# Patient Record
Sex: Male | Born: 1945
Health system: Southern US, Community
[De-identification: ages and names within clinical notes are randomized; demographics above are authoritative.]

## PROBLEM LIST (undated history)

## (undated) DIAGNOSIS — C8290 Follicular lymphoma, unspecified, unspecified site: Secondary | ICD-10-CM

## (undated) DIAGNOSIS — R2242 Localized swelling, mass and lump, left lower limb: Secondary | ICD-10-CM

## (undated) DIAGNOSIS — I729 Aneurysm of unspecified site: Secondary | ICD-10-CM

## (undated) DIAGNOSIS — N289 Disorder of kidney and ureter, unspecified: Secondary | ICD-10-CM

## (undated) DIAGNOSIS — Z87442 Personal history of urinary calculi: Secondary | ICD-10-CM

## (undated) DIAGNOSIS — R7301 Impaired fasting glucose: Secondary | ICD-10-CM

## (undated) DIAGNOSIS — C44320 Squamous cell carcinoma of skin of unspecified parts of face: Secondary | ICD-10-CM

## (undated) DIAGNOSIS — M1990 Unspecified osteoarthritis, unspecified site: Secondary | ICD-10-CM

## (undated) DIAGNOSIS — E785 Hyperlipidemia, unspecified: Secondary | ICD-10-CM

## (undated) DIAGNOSIS — A419 Sepsis, unspecified organism: Secondary | ICD-10-CM

## (undated) DIAGNOSIS — N200 Calculus of kidney: Secondary | ICD-10-CM

## (undated) DIAGNOSIS — N132 Hydronephrosis with renal and ureteral calculous obstruction: Secondary | ICD-10-CM

## (undated) DIAGNOSIS — I10 Essential (primary) hypertension: Secondary | ICD-10-CM

## (undated) HISTORY — DX: Calculus of kidney: N20.0

## (undated) HISTORY — PX: TONSILLECTOMY: SUR1361

## (undated) HISTORY — PX: LITHOTRIPSY: SUR834

## (undated) HISTORY — DX: Hyperlipidemia, unspecified: E78.5

## (undated) HISTORY — PX: VASECTOMY: SHX75

## (undated) HISTORY — DX: Squamous cell carcinoma of skin of unspecified parts of face: C44.320

## (undated) HISTORY — DX: Impaired fasting glucose: R73.01

---

## 1998-03-31 ENCOUNTER — Encounter: Admission: RE | Admit: 1998-03-31 | Discharge: 1998-03-31 | Payer: Self-pay | Admitting: *Deleted

## 2001-12-24 ENCOUNTER — Ambulatory Visit (HOSPITAL_BASED_OUTPATIENT_CLINIC_OR_DEPARTMENT_OTHER): Admission: RE | Admit: 2001-12-24 | Discharge: 2001-12-24 | Payer: Self-pay | Admitting: Plastic Surgery

## 2001-12-24 ENCOUNTER — Encounter (INDEPENDENT_AMBULATORY_CARE_PROVIDER_SITE_OTHER): Payer: Self-pay | Admitting: Specialist

## 2003-03-08 ENCOUNTER — Emergency Department (HOSPITAL_COMMUNITY): Admission: EM | Admit: 2003-03-08 | Discharge: 2003-03-08 | Payer: Self-pay | Admitting: Emergency Medicine

## 2003-03-08 ENCOUNTER — Encounter: Payer: Self-pay | Admitting: Emergency Medicine

## 2003-07-31 HISTORY — PX: CATARACT EXTRACTION: SUR2

## 2004-08-02 ENCOUNTER — Emergency Department (HOSPITAL_COMMUNITY): Admission: EM | Admit: 2004-08-02 | Discharge: 2004-08-02 | Payer: Self-pay | Admitting: Emergency Medicine

## 2004-08-02 ENCOUNTER — Ambulatory Visit: Payer: Self-pay | Admitting: Internal Medicine

## 2004-08-10 ENCOUNTER — Ambulatory Visit (HOSPITAL_COMMUNITY): Admission: RE | Admit: 2004-08-10 | Discharge: 2004-08-10 | Payer: Self-pay | Admitting: Urology

## 2004-09-21 ENCOUNTER — Ambulatory Visit (HOSPITAL_COMMUNITY): Admission: RE | Admit: 2004-09-21 | Discharge: 2004-09-21 | Payer: Self-pay | Admitting: Urology

## 2005-07-30 HISTORY — PX: COLONOSCOPY W/ POLYPECTOMY: SHX1380

## 2005-08-10 ENCOUNTER — Ambulatory Visit: Payer: Self-pay | Admitting: Internal Medicine

## 2005-08-14 ENCOUNTER — Ambulatory Visit: Payer: Self-pay | Admitting: Internal Medicine

## 2005-09-11 ENCOUNTER — Ambulatory Visit: Payer: Self-pay | Admitting: Internal Medicine

## 2005-09-26 ENCOUNTER — Ambulatory Visit: Payer: Self-pay | Admitting: Internal Medicine

## 2005-09-26 ENCOUNTER — Encounter (INDEPENDENT_AMBULATORY_CARE_PROVIDER_SITE_OTHER): Payer: Self-pay | Admitting: *Deleted

## 2006-12-06 ENCOUNTER — Ambulatory Visit: Payer: Self-pay | Admitting: Internal Medicine

## 2007-04-24 ENCOUNTER — Emergency Department (HOSPITAL_COMMUNITY): Admission: EM | Admit: 2007-04-24 | Discharge: 2007-04-24 | Payer: Self-pay | Admitting: Emergency Medicine

## 2007-09-01 ENCOUNTER — Telehealth (INDEPENDENT_AMBULATORY_CARE_PROVIDER_SITE_OTHER): Payer: Self-pay | Admitting: *Deleted

## 2007-09-02 ENCOUNTER — Ambulatory Visit: Payer: Self-pay | Admitting: Internal Medicine

## 2007-09-02 DIAGNOSIS — E782 Mixed hyperlipidemia: Secondary | ICD-10-CM | POA: Insufficient documentation

## 2007-09-02 DIAGNOSIS — I10 Essential (primary) hypertension: Secondary | ICD-10-CM | POA: Insufficient documentation

## 2007-09-02 DIAGNOSIS — Z87442 Personal history of urinary calculi: Secondary | ICD-10-CM | POA: Insufficient documentation

## 2007-09-02 LAB — CONVERTED CEMR LAB: Cholesterol, target level: 200 mg/dL

## 2007-09-04 ENCOUNTER — Telehealth (INDEPENDENT_AMBULATORY_CARE_PROVIDER_SITE_OTHER): Payer: Self-pay | Admitting: *Deleted

## 2007-09-08 ENCOUNTER — Telehealth (INDEPENDENT_AMBULATORY_CARE_PROVIDER_SITE_OTHER): Payer: Self-pay | Admitting: *Deleted

## 2007-09-10 ENCOUNTER — Encounter: Payer: Self-pay | Admitting: Internal Medicine

## 2007-12-02 ENCOUNTER — Ambulatory Visit: Payer: Self-pay | Admitting: Internal Medicine

## 2007-12-02 LAB — CONVERTED CEMR LAB
Albumin: 3.9 g/dL (ref 3.5–5.2)
Alkaline Phosphatase: 64 units/L (ref 39–117)
BUN: 16 mg/dL (ref 6–23)
Creatinine, Ser: 1.3 mg/dL (ref 0.4–1.5)
LDL Cholesterol: 86 mg/dL (ref 0–99)
Potassium: 4.2 meq/L (ref 3.5–5.1)
Total CHOL/HDL Ratio: 3.6
Total Protein: 6.5 g/dL (ref 6.0–8.3)
Triglycerides: 76 mg/dL (ref 0–149)
VLDL: 15 mg/dL (ref 0–40)

## 2007-12-09 ENCOUNTER — Ambulatory Visit: Payer: Self-pay | Admitting: Internal Medicine

## 2007-12-09 LAB — CONVERTED CEMR LAB: Cholesterol, target level: 200 mg/dL

## 2008-02-11 ENCOUNTER — Emergency Department (HOSPITAL_COMMUNITY): Admission: EM | Admit: 2008-02-11 | Discharge: 2008-02-11 | Payer: Self-pay | Admitting: Emergency Medicine

## 2009-02-21 ENCOUNTER — Telehealth (INDEPENDENT_AMBULATORY_CARE_PROVIDER_SITE_OTHER): Payer: Self-pay | Admitting: *Deleted

## 2009-03-03 ENCOUNTER — Ambulatory Visit: Payer: Self-pay | Admitting: Internal Medicine

## 2009-03-08 ENCOUNTER — Encounter (INDEPENDENT_AMBULATORY_CARE_PROVIDER_SITE_OTHER): Payer: Self-pay | Admitting: *Deleted

## 2009-03-08 ENCOUNTER — Ambulatory Visit: Payer: Self-pay | Admitting: Internal Medicine

## 2009-03-08 DIAGNOSIS — D126 Benign neoplasm of colon, unspecified: Secondary | ICD-10-CM | POA: Insufficient documentation

## 2009-03-08 DIAGNOSIS — R7301 Impaired fasting glucose: Secondary | ICD-10-CM | POA: Insufficient documentation

## 2009-03-08 DIAGNOSIS — H5340 Unspecified visual field defects: Secondary | ICD-10-CM | POA: Insufficient documentation

## 2009-03-08 LAB — CONVERTED CEMR LAB
Alkaline Phosphatase: 49 units/L (ref 39–117)
BUN: 25 mg/dL — ABNORMAL HIGH (ref 6–23)
Bilirubin, Direct: 0 mg/dL (ref 0.0–0.3)
CO2: 32 meq/L (ref 19–32)
Chloride: 104 meq/L (ref 96–112)
Cholesterol: 147 mg/dL (ref 0–200)
Creatinine, Ser: 1.4 mg/dL (ref 0.4–1.5)
Glucose, Bld: 95 mg/dL (ref 70–99)
LDL Cholesterol: 76 mg/dL (ref 0–99)
Potassium: 4.6 meq/L (ref 3.5–5.1)
Total Bilirubin: 0.9 mg/dL (ref 0.3–1.2)
Total CHOL/HDL Ratio: 3
VLDL: 13.4 mg/dL (ref 0.0–40.0)

## 2009-03-10 ENCOUNTER — Encounter (INDEPENDENT_AMBULATORY_CARE_PROVIDER_SITE_OTHER): Payer: Self-pay | Admitting: *Deleted

## 2009-09-09 ENCOUNTER — Ambulatory Visit: Payer: Self-pay | Admitting: Internal Medicine

## 2009-09-09 DIAGNOSIS — R1310 Dysphagia, unspecified: Secondary | ICD-10-CM | POA: Insufficient documentation

## 2009-09-09 DIAGNOSIS — R51 Headache: Secondary | ICD-10-CM | POA: Insufficient documentation

## 2009-09-09 DIAGNOSIS — R519 Headache, unspecified: Secondary | ICD-10-CM | POA: Insufficient documentation

## 2009-09-09 DIAGNOSIS — J309 Allergic rhinitis, unspecified: Secondary | ICD-10-CM | POA: Insufficient documentation

## 2010-03-09 ENCOUNTER — Telehealth (INDEPENDENT_AMBULATORY_CARE_PROVIDER_SITE_OTHER): Payer: Self-pay | Admitting: *Deleted

## 2010-04-05 ENCOUNTER — Ambulatory Visit: Payer: Self-pay | Admitting: Internal Medicine

## 2010-04-05 LAB — CONVERTED CEMR LAB
Albumin: 4.1 g/dL (ref 3.5–5.2)
Alkaline Phosphatase: 55 units/L (ref 39–117)
Cholesterol: 131 mg/dL (ref 0–200)
HDL: 47.9 mg/dL (ref >39.00)
LDL Cholesterol: 69 mg/dL (ref 0–99)
Total CHOL/HDL Ratio: 3
Total Protein: 6.2 g/dL (ref 6.0–8.3)
Triglycerides: 73 mg/dL (ref 0.0–149.0)

## 2010-04-06 ENCOUNTER — Ambulatory Visit: Payer: Self-pay | Admitting: Internal Medicine

## 2010-04-06 DIAGNOSIS — R079 Chest pain, unspecified: Secondary | ICD-10-CM | POA: Insufficient documentation

## 2010-04-28 ENCOUNTER — Ambulatory Visit: Payer: Self-pay | Admitting: Cardiology

## 2010-04-28 ENCOUNTER — Ambulatory Visit: Payer: Self-pay

## 2010-08-27 LAB — CONVERTED CEMR LAB
Hgb A1c MFr Bld: 5.9 % (ref 4.6–6.5)
LDL Goal: 100 mg/dL
PSA: 0.92 ng/mL (ref 0.10–4.00)

## 2010-08-29 NOTE — Assessment & Plan Note (Signed)
Summary: occasional chest pain/cbs   Vital Signs:  Patient profile:   65 year old male Weight:      238.2 pounds BMI:     32.42 Temp:     98.9 degrees F oral Pulse rate:   56 / minute Resp:     14 per minute BP sitting:   128 / 80  (left arm) Cuff size:   large  Vitals Entered By: Shonna Chock CMA (April 06, 2010 8:59 AM) CC: Chest pain off/on x 6 months    CC:  Chest pain off/on x 6 months .  History of Present Illness:      This is a 65 year old male who presents with intermittent chest pain for 6 months.  The patient reports exertional chest pain ONLY with organism ( unaffected by position)  but not with intercourse.He walks 3.7 miles over 61 min w/o chest pain, but he has DOE with hills. He  denies nausea, vomiting, diaphoresis, shortness of breath, palpitations, dizziness, light headedness, syncope, and indigestion.  The pain is described as  a sharp "pinch".  The pain is located in the left anterior chest& right anterior chest, and the pain does not radiate.  Episodes of chest pain last < 1 minute.  The pain is relieved or improved with rest.  Hyperlipidemia Follow-Up      The patient also presents for Hyperlipidemia follow-up.  The patient reports muscle aches after lifting weights, but denies GI upset, abdominal pain, flushing, itching, constipation, diarrhea, and fatigue.  Other symptoms include pedal edema after 12 hr work  shift.  The patient denies the following symptoms: palpitations and syncope.  Compliance with medications (by patient report) has been near 100%.  Dietary compliance has been fair.  The patient reports exercising 3-4X per week.  Adjunctive measures currently used by the patient include ASA.  Lipids are excellent .  Current Medications (verified): 1)  Multivitamin .... Qd 2)  Asa 81mg  .... Qd 3)  Lipitor 20 Mg  Tabs (Atorvastatin Calcium) .Marland Kitchen.. 1 M,w,f,sun **appointment Due** 4)  Metoprolol Tartrate 25 Mg  Tabs (Metoprolol Tartrate) .... Take One Tablet  Twice Daily 5)  Advil Prn 6)  Amlodipine Besylate 5 Mg Tabs (Amlodipine Besylate) .Marland Kitchen.. 1 Once Daily 7)  Fluticasone Propionate 50 Mcg/act Susp (Fluticasone Propionate) .Marland Kitchen.. 1 Spray Two Times A Day As Discussed 8)  Gabapentin 100 Mg Caps (Gabapentin) .Marland Kitchen.. 1 Q 8 Hrs As Needed Headaches  Allergies (verified): No Known Drug Allergies  Review of Systems GI:  Denies bloody stools and dark tarry stools.  Physical Exam  General:  well-nourished,in no acute distress; alert,appropriate and cooperative throughout examination Lungs:  Normal respiratory effort, chest expands symmetrically. Lungs are clear to auscultation, no crackles or wheezes. Heart:  regular rhythm, no murmur, no gallop, no rub, no JVD, no HJR, and bradycardia.  S4 with slurring Abdomen:  Bowel sounds positive,abdomen soft and non-tender without masses, organomegaly or hernias noted. Pulses:  R and L carotid,radial,dorsalis pedis and posterior tibial pulses are full and equal bilaterally Extremities:  No clubbing, cyanosis, edema.Negative Homan's sign Neurologic:  alert & oriented X3.   Skin:  Intact without suspicious lesions or rashes Psych:  memory intact for recent and remote, normally interactive, and good eye contact.     Impression & Recommendations:  Problem # 1:  CHEST PAIN (ICD-786.50)  Orders: EKG w/ Interpretation (93000) Misc. Referral (Misc. Ref)  Problem # 2:  HYPERLIPIDEMIA (ICD-272.2)  His updated medication list for this problem includes:  Lipitor 20 Mg Tabs (Atorvastatin calcium) .Marland Kitchen... 1 m,w,f,sun  Problem # 3:  HYPERTENSION, ESSENTIAL NOS (ICD-401.9) controlled His updated medication list for this problem includes:    Metoprolol Tartrate 25 Mg Tabs (Metoprolol tartrate) .Marland Kitchen... Take one tablet twice daily    Amlodipine Besylate 5 Mg Tabs (Amlodipine besylate) .Marland Kitchen... 1 once daily  Complete Medication List: 1)  Multivitamin  .... Qd 2)  Asa 81mg   .... Qd 3)  Lipitor 20 Mg Tabs (Atorvastatin  calcium) .Marland Kitchen.. 1 m,w,f,sun 4)  Metoprolol Tartrate 25 Mg Tabs (Metoprolol tartrate) .... Take one tablet twice daily 5)  Advil Prn  6)  Amlodipine Besylate 5 Mg Tabs (Amlodipine besylate) .Marland Kitchen.. 1 once daily 7)  Fluticasone Propionate 50 Mcg/act Susp (Fluticasone propionate) .Marland Kitchen.. 1 spray two times a day as discussed 8)  Gabapentin 100 Mg Caps (Gabapentin) .Marland Kitchen.. 1 q 8 hrs as needed headaches  Patient Instructions: 1)  Check your Blood Pressure regularly. If it is above: 135/85 ONAVERAGE  you should make an appointment. Prescriptions: AMLODIPINE BESYLATE 5 MG TABS (AMLODIPINE BESYLATE) 1 once daily  #90 x 3   Entered and Authorized by:   Marga Melnick MD   Signed by:   Marga Melnick MD on 04/06/2010   Method used:   Print then Give to Patient   RxID:   [id number] METOPROLOL TARTRATE 25 MG  TABS (METOPROLOL TARTRATE) Take one tablet twice daily  #180 x 3   Entered and Authorized by:   Marga Melnick MD   Signed by:   Marga Melnick MD on 04/06/2010   Method used:   Print then Give to Patient   RxID:   [id number] LIPITOR 20 MG  TABS (ATORVASTATIN CALCIUM) 1 M,W,F,Sun  #90 x 2   Entered and Authorized by:   Marga Melnick MD   Signed by:   Marga Melnick MD on 04/06/2010   Method used:   Print then Give to Patient   RxID:   [id number]

## 2010-08-29 NOTE — Assessment & Plan Note (Signed)
Summary: TROUBLE SWALLOWING/RH......   Vital Signs:  Patient profile:   65 year old male Weight:      236 pounds Temp:     98.8 degrees F oral Pulse rate:   60 / minute Resp:     15 per minute BP sitting:   134 / 92  (left arm) Cuff size:   large  Vitals Entered By: Shonna Chock (September 09, 2009 2:52 PM) CC: Trouble swallowing, Hypertension Management Comments REVIEWED MED LIST, PATIENT AGREED DOSE AND INSTRUCTION CORRECT    CC:  Trouble swallowing and Hypertension Management.  History of Present Illness: He could  not swallow the PNDrainage secretions last night; as if "throat closed in". He has chronic seasonal rhinitis with clear secretions. No symptoms of infection. He is on ACE-I; no edema of  tongue or lips. See BP ; it has been elevated ; he missed meds over holidays  Hypertension History:      He complains of headache, peripheral edema, and side effects from treatment, but denies chest pain, palpitations, dyspnea with exertion, orthopnea, PND, visual symptoms, neurologic problems, and syncope.  He notes the following problems with antihypertensive medication side effects: See HPI; R/O atypical angioedema.  Further comments include: Occa pedal edema.        Positive major cardiovascular risk factors include male age 63 years old or older, hyperlipidemia, and hypertension.  Negative major cardiovascular risk factors include no history of diabetes, negative family history for ischemic heart disease, and non-tobacco-user status.        Further assessment for target organ damage reveals no history of ASHD, stroke/TIA, or peripheral vascular disease.     Allergies (verified): No Known Drug Allergies  Review of Systems General:  Denies chills, fever, and sweats. Eyes:  Denies blurring, double vision, and vision loss-both eyes. ENT:  Complains of sinus pressure; denies nasal congestion; No facial pain , frontal headache or significant purulence. Resp:  Complains of excessive  snoring; denies cough, hypersomnolence, morning headaches, sputum productive, and wheezing. GI:  Complains of gas; denies indigestion; rare  dysphagia with food  X 20 yrs. Neuro:  Denies numbness and tingling; Headaches are sharp & throbbing in R ant temple; Advil +/- benefit. Allergy:  Denies hives or rash, itching eyes, and sneezing.  Physical Exam  General:  well-nourished,in no acute distress; alert,appropriate and cooperative throughout examination Ears:  External ear exam shows no significant lesions or deformities.  Otoscopic examination reveals clear canals, tympanic membranes are intact bilaterally without bulging, retraction, inflammation or discharge. Hearing is grossly normal bilaterally. Nose:  External nasal examination shows no deformity or inflammation. Nasal mucosa are pink and moist without lesions or exudates. Mouth:  Oral mucosa and oropharynx without lesions or exudates.  Uvula edematous Neck:  No deformities, masses, or tenderness noted. No UAO to hyperventilation Lungs:  Normal respiratory effort, chest expands symmetrically. Lungs are clear to auscultation, no crackles or wheezes. Cervical Nodes:  No lymphadenopathy noted Axillary Nodes:  No palpable lymphadenopathy   Impression & Recommendations:  Problem # 1:  PROBLEMS WITH SWALLOWING AND MASTICATION (ICD-V41.6) R/O angioedema variant from Lisinopril   Problem # 2:  HEADACHE (ICD-784.0) ? migraine His updated medication list for this problem includes:    Metoprolol Tartrate 25 Mg Tabs (Metoprolol tartrate) .Marland Kitchen... Take one tablet twice daily  Problem # 3:  HYPERTENSION, ESSENTIAL NOS (ICD-401.9)  The following medications were removed from the medication list:    Lisinopril 20 Mg Tabs (Lisinopril) .Marland Kitchen... 1 once daily- office visit and  labs due His updated medication list for this problem includes:    Metoprolol Tartrate 25 Mg Tabs (Metoprolol tartrate) .Marland Kitchen... Take one tablet twice daily    Amlodipine Besylate 5  Mg Tabs (Amlodipine besylate) .Marland Kitchen... 1 once daily  Problem # 4:  RHINITIS (ICD-477.9)  Seasonal   His updated medication list for this problem includes:    Fluticasone Propionate 50 Mcg/act Susp (Fluticasone propionate) .Marland Kitchen... 1 spray two times a day as discussed  Complete Medication List: 1)  Multivitamin  .... Qd 2)  Asa 81mg   .... Qd 3)  Lipitor 20 Mg Tabs (Atorvastatin calcium) .Marland Kitchen.. 1 m,w,f,sun 4)  Metoprolol Tartrate 25 Mg Tabs (Metoprolol tartrate) .... Take one tablet twice daily 5)  Advil Prn  6)  Amlodipine Besylate 5 Mg Tabs (Amlodipine besylate) .Marland Kitchen.. 1 once daily 7)  Fluticasone Propionate 50 Mcg/act Susp (Fluticasone propionate) .Marland Kitchen.. 1 spray two times a day as discussed 8)  Gabapentin 100 Mg Caps (Gabapentin) .Marland Kitchen.. 1 q 8 hrs as needed headaches  Hypertension Assessment/Plan:      The patient's hypertensive risk group is category B: At least one risk factor (excluding diabetes) with no target organ damage.  His calculated 10 year risk of coronary heart disease is 9 %.  Today's blood pressure is 134/92.    Patient Instructions: 1)  Check your Blood Pressure regularly. If it is above:135/85 ON AVERAGE on Metoprolol & Amlodipine  you should make an appointment. Prescriptions: GABAPENTIN 100 MG CAPS (GABAPENTIN) 1 q 8 hrs as needed headaches  #30 x 2   Entered and Authorized by:   Marga Melnick MD   Signed by:   Marga Melnick MD on 09/09/2009   Method used:   Faxed to ...       [facility]* (retail)       [address]       [address] Shipping/mailing       [place], [place]  [address]       Ph: [phone]       Fax: [phone]   RxID:   [id number] FLUTICASONE PROPIONATE 50 MCG/ACT SUSP (FLUTICASONE PROPIONATE) 1 spray two times a day as discussed  #1 x 11   Entered and Authorized by:   Marga Melnick MD   Signed by:   Marga Melnick MD on 09/09/2009   Method used:   Faxed to ...       [facility]* (retail)       [address]       [address] Shipping/mailing       [place], [place]  [address]       Ph: [phone]       Fax: [phone]   RxID:   [id number] AMLODIPINE BESYLATE 5 MG TABS (AMLODIPINE BESYLATE) 1 once daily  #90 x 1   Entered and Authorized by:   Marga Melnick MD   Signed by:   Marga Melnick MD on 09/09/2009   Method used:   Faxed to ...       [facility]* (retail)       [address]       [address] Shipping/mailing       [place], [place]  [address]       Ph: [phone]       Fax: [phone]   RxID:   [id number]

## 2010-08-29 NOTE — Progress Notes (Signed)
Summary: REFILL REQUEST  Phone Note Refill Request Call back at [phone] Message from:  Pharmacy on March 09, 2010 12:37 PM  Refills Requested: Medication #1:  LIPITOR 20 MG  TABS 1 M   Dosage confirmed as above?Dosage Confirmed   Supply Requested: 3 months  Medication #2:  AMLODIPINE BESYLATE 5 MG TABS 1 once daily   Dosage confirmed as above?Dosage Confirmed   Supply Requested: 3 months [facility]  Next Appointment Scheduled: NONE Initial call taken by: Lavell Islam,  March 09, 2010 12:37 PM    New/Updated Medications: LIPITOR 20 MG  TABS (ATORVASTATIN CALCIUM) 1 M,W,F,Sun **APPOINTMENT DUE** Prescriptions: AMLODIPINE BESYLATE 5 MG TABS (AMLODIPINE BESYLATE) 1 once daily  #90 x 1   Entered by:   Shonna Chock CMA   Authorized by:   Marga Melnick MD   Signed by:   Shonna Chock CMA on 03/09/2010   Method used:   Electronically to        [facility]* (retail)       [address]       [address] [place] Shipping/mailing       [place], [place]  [address]       Ph: [phone]       Fax: [phone]   RxID:   [id number] LIPITOR 20 MG  TABS (ATORVASTATIN CALCIUM) 1 M,W,F,Sun **APPOINTMENT DUE**  #30 x 0   Entered by:   Shonna Chock CMA   Authorized by:   Marga Melnick MD   Signed by:   Shonna Chock CMA on 03/09/2010   Method used:   Electronically to        [facility]* (retail)       [address]       [address] [place] [address]/mailing       [place], [place]  [address]       Ph: [phone]       Fax: [phone]   RxID:   [id number]  Patient is due for labs Lipids/Hep 272.4/995.20

## 2010-10-12 ENCOUNTER — Encounter: Payer: Self-pay | Admitting: Internal Medicine

## 2010-10-17 NOTE — Letter (Signed)
Summary: Colonoscopy Letter  [facility]  [address]   [place], [place] [address]   Phone: [phone]  Fax: [phone]      October 12, 2010 MRN: [id number]   Richard [name] [address] [place], [place]  [address]   Dear [name],   According to your medical record, it is time for you to schedule a Colonoscopy. The American Cancer Society recommends this procedure as a method to detect early colon cancer. Patients with a family history of colon cancer, or a personal history of colon polyps or inflammatory bowel disease are at increased risk.  This letter has been generated based on the recommendations made at the time of your procedure. If you feel that in your particular situation this may no longer apply, please contact our office.  Please call our office at [phone] to schedule this appointment or to update your records at your earliest convenience.  Thank you for cooperating with [place] to provide you with the very best care possible.   Sincerely,   Stan Head, M.D.  [facility] [phone]

## 2010-12-12 NOTE — Assessment & Plan Note (Signed)
[facility]                        [facility] OFFICE NOTE   Richard [name]                        MRN:          [id number]  DATE:12/06/2006                            DOB:          Nov 01, 1945    Richard [name] was seen Dec 06, 2006, for a monitor of his hypertension and  dyslipidemia.  He had not been seen since January 2007.  He had stopped  his Lipitor; he stated that he began eating crazy and therefore did  not return for recheck.   He denies epistaxis or headaches or cardiopulmonary symptoms.  He is  exercising at a high level with weights without chest pain or shortness  of breath.   He is employing [employer] and has increased fruits and vegetables in his  diet with significant weight loss.   PAST MEDICAL HISTORY:  1. Tonsillectomy and adenoidectomy.  2. Cataract surgery.  3. Fracture of the fibula.  4. Renal calculi, status post lithotripsy x3.  5. He has had squamous cell cancer of the skin.   Father had a heart attack at 63 and a brother had hypertension.   He has never smoked.  He drinks minimally.   He has no known drug allergies.   He has been on Toprol XL 50 mg, multivitamin and Advil as needed.  He  has been taking Advil P.M.   He denies any reflux symptoms except for belching.   The remainder of the review of systems is essentially negative.   PHYSICAL EXAMINATION:  VITAL SIGNS:  Weight is up approximately 18  pounds to 231 since January 2007.  Pulse is 60 and regular, respiratory  rate is 15, and blood pressure 134/72.  NECK:  The thyroid is normal to palpation.  He has no lymphadenopathy  about the neck or axillae.  CARDIAC:  An S4 is present with slurring.  All pulses are intact.  There  is no edema.  CHEST:  Clear.  ABDOMEN:  Nontender.  No masses or organomegaly are present.  EXTREMITIES:  All pulses are intact.  There is no edema.  MUSCULOSKELETAL:  Unremarkable.   EKG is within normal limits.   The chart is  reviewed.  Based on his NMR LipoProfile in  January 2007,  he has a 20% risk of premature cardiovascular disease.  It will be  recommended that he have fasting lipids and A1c.  The Toprol will be  renewed.  BUN, potassium and creatinine will also be checked.   Prostate exam was negative and Hemoccult testing was negative.  PSA  monitor is recommended because of age.   Further recommendations are pending return of those fasting labs.  He  does work at the hospital.  It was recommended that he monitor his blood  pressure; the goal is less than 130/85 on average.     Richard [name]. Richard Ren, MD,FACP,FCCP  Electronically Signed    [facility]  DD: 12/06/2006  DT: 12/07/2006  Job #: [id number]

## 2010-12-15 NOTE — Op Note (Signed)
[place]  Patient:    YANG, RACK Visit Number: [id number] MRN: [id number]          Service Type: DSU Location: [facility] Attending Physician:  Loura Halt Ii Dictated by:   Alfredia Ferguson, M.D. Proc. Date: 12/24/01 Admit Date:  12/24/2001   CC:         Dr. Venancio Poisson   Operative Report  PREOPERATIVE DIAGNOSIS:  A 1.2 cm dysplastic nevus right cheek.  POSTOPERATIVE DIAGNOSIS:  A 1.2 cm dysplastic nevus right cheek.  OPERATION PERFORMED:  Elliptical excision of dysplastic nevus with 2 mm margins and primary closure of dysplastic nevus right cheek.  SURGEON:  Alfredia Ferguson, M.D.  ANESTHESIA:  2% Xylocaine with 1:100,000 epinephrine.  INDICATIONS FOR PROCEDURE:  The patient is a 65 year old gentleman who underwent a biopsy of a pigmented nevus on his right cheek by Dr. Venancio Poisson. The lesion returned dysplastic nevus.  The patient has residual pigmentation. He wishes to have the area further excised.  He understands he will be trading what he has for a permanent, potentially unsightly scar.  In spite of that he wishes to proceed with the surgery.  He also understands the possibility of positive margins.  DESCRIPTION OF PROCEDURE:  The patient has a 1.2 cm pigmented nevus in his right cheek with vague margins.  An elliptical skin marker was placed around this lesion with approximately 2 mm margins.  Local anesthesia was infiltrated and the area was prepped and draped in sterile fashion.  After waiting approximately 10 minutes, an elliptical excision of the lesion down to the level of the subcutaneous tissues was carried out.  The lesion was passed off for pathology.  Hemostasis was accomplished using cautery.  The wound edges were undermined for a distance of approximately 5 to 6 mm in all directions. The wound was closed by approximating the dermis using interrupted 4-0 Monocryl suture.  The skin was united using running 5-0  suture.  A light bandage was applied and the patient was discharged home in satisfactory condition. Dictated by:   Alfredia Ferguson, M.D. Attending Physician:  Loura Halt Ii DD:  12/24/01 TD:  12/25/01 Job: [id number]

## 2011-01-25 ENCOUNTER — Encounter: Payer: Self-pay | Admitting: Cardiology

## 2011-04-10 ENCOUNTER — Other Ambulatory Visit: Payer: Self-pay | Admitting: Internal Medicine

## 2011-04-11 MED ORDER — METOPROLOL TARTRATE 25 MG PO TABS: 25.0000 mg | ORAL_TABLET | Freq: Two times a day (BID) | ORAL | Status: DC

## 2011-04-11 NOTE — Telephone Encounter (Signed)
RX sent, patient will need to schedule CPX with fasting labs

## 2011-06-01 ENCOUNTER — Other Ambulatory Visit: Payer: Self-pay | Admitting: Internal Medicine

## 2011-06-12 MED ORDER — ATORVASTATIN CALCIUM 20 MG PO TABS: 20.0000 mg | ORAL_TABLET | ORAL | Status: DC

## 2011-06-12 NOTE — Telephone Encounter (Signed)
RX sent

## 2011-07-03 ENCOUNTER — Other Ambulatory Visit: Payer: Self-pay | Admitting: Internal Medicine

## 2011-07-03 MED ORDER — AMLODIPINE BESYLATE 5 MG PO TABS: ORAL_TABLET | ORAL | Status: DC

## 2011-07-03 NOTE — Telephone Encounter (Signed)
RX sent, patient with pending appointment

## 2011-07-10 ENCOUNTER — Ambulatory Visit (INDEPENDENT_AMBULATORY_CARE_PROVIDER_SITE_OTHER): Payer: 59 | Admitting: Internal Medicine

## 2011-07-10 ENCOUNTER — Encounter: Payer: Self-pay | Admitting: Internal Medicine

## 2011-07-10 VITALS — BP 122/74 | HR 52 | Temp 97.7°F | Resp 14 | Ht 72.0 in | Wt 232.2 lb

## 2011-07-10 DIAGNOSIS — I10 Essential (primary) hypertension: Secondary | ICD-10-CM

## 2011-07-10 DIAGNOSIS — R6882 Decreased libido: Secondary | ICD-10-CM

## 2011-07-10 DIAGNOSIS — R109 Unspecified abdominal pain: Secondary | ICD-10-CM

## 2011-07-10 DIAGNOSIS — E782 Mixed hyperlipidemia: Secondary | ICD-10-CM

## 2011-07-10 DIAGNOSIS — D126 Benign neoplasm of colon, unspecified: Secondary | ICD-10-CM

## 2011-07-10 DIAGNOSIS — Z0000 Encounter for general adult medical examination without abnormal findings: Secondary | ICD-10-CM

## 2011-07-10 MED ORDER — RANITIDINE HCL 150 MG PO TABS: 150.0000 mg | ORAL_TABLET | Freq: Two times a day (BID) | ORAL | Status: DC

## 2011-07-10 NOTE — Patient Instructions (Addendum)
Please  schedule fasting Labs : BMET,Lipids, hepatic panel, CBC & dif, TSH, PSA, & Testosterone . PLEASE BRING THESE INSTRUCTIONS TO FOLLOW UP  LAB APPOINTMENT.This will guarantee correct labs are drawn, eliminating need for repeat blood sampling ( needle sticks ! ). Diagnoses /Codes: V70.0, 799.81  The triggers for reflux  include stress; the "aspirin family" ; alcohol; peppermint; and caffeine (coffee, tea, cola, and chocolate). The aspirin family would include aspirin and the nonsteroidal agents such as ibuprofen &  Naproxen. Tylenol would not cause reflux. Food & drink should be avoided for @ least 2 hours before going to bed.

## 2011-07-10 NOTE — Progress Notes (Signed)
Subjective:    Patient ID: Richard [name], male    DOB: [birth year], 65 y.o.   MRN: [id number]  HPI  Richard [name]  is here for a physical;acute issues include abdominal pain      Review of Systems ABDOMINAL PAIN: Location: L lateral abdomen  Onset: 5-6 mos ago, ?began after ingesting muscle milk & chocolate milk   Radiation: to L inferior rib cage  Severity: up to 3 Quality: "gas" pain  Duration: hours  Better with:water  & off the milks above Worse with: no exacerbating factors  Symptoms Nausea/Vomiting: no  Diarrhea: no  Constipation: no  Melena/BRBPR: no  Hematemesis: no  Anorexia: no  Fever/Chills: no  Dysuria/ hematuria/pyuria: no, but with similar gas pain initially with calculi; that pain progresses to severe pain  Wt loss: no  EtOH use: no  NSAIDs/ASA: yes, 3 Advil 2X/ week with workouts & Advil PM Fri night  Past Surgeries: colonoscopy : 2 adenomas 2007, no F/U to date  ROS: decreased libido ; no muscle weakness        Objective:   Physical Exam Gen.: Healthy and well-nourished in appearance. Alert, appropriate and cooperative throughout exam. Head: Normocephalic without obvious abnormalities; beard & moustache Eyes: No corneal or conjunctival inflammation noted. Pupils equal round reactive to light and accommodation. Fundal exam is benign without hemorrhages, exudate, papilledema. Extraocular motion intact. Ears: External  ear exam reveals no significant lesions or deformities. Canals clear .TMs normal. Hearing is grossly normal bilaterally. Nose: External nasal exam reveals no deformity or inflammation. Nasal mucosa are pink and moist. No lesions or exudates noted.  Mouth: Oral mucosa and oropharynx reveal no lesions or exudates. Teeth in good repair. Neck: No deformities, masses, or tenderness noted.  Thyroid normal. Lungs: Normal respiratory effort; chest expands symmetrically. Lungs are clear to auscultation without rales, wheezes, or increased work of  breathing. Heart: Normal rate and rhythm. Normal S1 and S2. No gallop, click, or rub. No murmur. Abdomen: Bowel sounds normal; abdomen soft and nontender. No masses, organomegaly or hernias noted. Genitalia/ DRE: Intrascrotal varices present on the left. Prostate is normal without enlargement, asymmetry, nodularity, or induration. Fecal occult blood testing is negative.                                                                      Musculoskeletal/extremities: No deformity or scoliosis noted of  the thoracic or lumbar spine. No clubbing, cyanosis, edema, or deformity noted. Range of motion  normal .Tone & strength  normal.Joints normal. Nail health  good. Vascular: Carotid, radial artery, dorsalis pedis and  posterior tibial pulses are full and equal. No bruits present. Neurologic: Alert and oriented x3. Deep tendon reflexes symmetrical and normal.          Skin: Intact without suspicious lesions or rashes. Lymph: No cervical, axillary, or inguinal lymphadenopathy present. Psych: Mood and affect are normal. Normally interactive  Assessment & Plan:  #1 comprehensive physical exam; no acute findings #2 see Problem List with Assessments & Recommendations.  #3 the abdominal symptoms are most likely related to reflux variant. This is most likely because of his ingestion of Advil. Lactose intolerance is not suggested as he has had no bowel changes. The location of the pain also mitigates against nephrolithiasis etiology.  #4 apparent adenomatous polyps in 2007; he is overdue for colonoscopic surveillance  #5 decreased libido; an 8 AM testosterone level and being needed. This can be corrected with routine labs. Plan: see Orders   EKG reveals low voltage in the precordial leads due to his barrel chested habitus.

## 2011-07-11 ENCOUNTER — Encounter: Payer: Self-pay | Admitting: Internal Medicine

## 2011-07-16 ENCOUNTER — Other Ambulatory Visit: Payer: Self-pay | Admitting: Internal Medicine

## 2011-07-16 DIAGNOSIS — Z0000 Encounter for general adult medical examination without abnormal findings: Secondary | ICD-10-CM

## 2011-07-17 ENCOUNTER — Other Ambulatory Visit (INDEPENDENT_AMBULATORY_CARE_PROVIDER_SITE_OTHER): Payer: 59

## 2011-07-17 ENCOUNTER — Telehealth: Payer: Self-pay | Admitting: Internal Medicine

## 2011-07-17 DIAGNOSIS — Z0000 Encounter for general adult medical examination without abnormal findings: Secondary | ICD-10-CM

## 2011-07-17 LAB — PSA: PSA: 1.03 ng/mL (ref <=4.00)

## 2011-07-17 LAB — HEPATIC FUNCTION PANEL
AST: 25 U/L (ref 0–37)
Albumin: 4 g/dL (ref 3.5–5.2)
Alkaline Phosphatase: 51 U/L (ref 39–117)
Indirect Bilirubin: 0.5 mg/dL (ref 0.0–0.9)
Total Protein: 5.9 g/dL — ABNORMAL LOW (ref 6.0–8.3)

## 2011-07-17 LAB — BASIC METABOLIC PANEL
Calcium: 9.3 mg/dL (ref 8.4–10.5)
Creat: 1.2 mg/dL (ref 0.50–1.35)

## 2011-07-17 LAB — LIPID PANEL
Cholesterol: 144 mg/dL (ref 0–200)
HDL: 54 mg/dL (ref >39)
LDL Cholesterol: 76 mg/dL (ref 0–99)
Triglycerides: 72 mg/dL (ref <150)

## 2011-07-17 MED ORDER — METOPROLOL TARTRATE 25 MG PO TABS: 25.0000 mg | ORAL_TABLET | Freq: Two times a day (BID) | ORAL | Status: DC

## 2011-07-17 NOTE — Telephone Encounter (Signed)
RX sent

## 2011-07-17 NOTE — Telephone Encounter (Signed)
Patient needs refill metoprolol -- [facility]

## 2011-07-18 LAB — TESTOSTERONE, FREE, TOTAL, SHBG
Sex Hormone Binding: 36 nmol/L (ref 13–71)
Testosterone-% Free: 1.8 % (ref 1.6–2.9)
Testosterone: 268.78 ng/dL (ref 250–890)

## 2011-07-18 LAB — CBC WITH DIFFERENTIAL/PLATELET
Basophils Absolute: 0 10*3/uL (ref 0.0–0.1)
Basophils Relative: 0 % (ref 0–1)
Eosinophils Absolute: 0.3 10*3/uL (ref 0.0–0.7)
HCT: 44.7 % (ref 39.0–52.0)
Hemoglobin: 14.6 g/dL (ref 13.0–17.0)
MCH: 27.6 pg (ref 26.0–34.0)
MCHC: 32.7 g/dL (ref 30.0–36.0)
Monocytes Absolute: 0.7 10*3/uL (ref 0.1–1.0)
Monocytes Relative: 8 % (ref 3–12)
Neutro Abs: 4.9 10*3/uL (ref 1.7–7.7)
Neutrophils Relative %: 58 % (ref 43–77)
RDW: 15.8 % — ABNORMAL HIGH (ref 11.5–15.5)

## 2011-08-01 ENCOUNTER — Other Ambulatory Visit: Payer: Self-pay | Admitting: Internal Medicine

## 2011-08-01 ENCOUNTER — Telehealth: Payer: Self-pay | Admitting: Internal Medicine

## 2011-08-01 DIAGNOSIS — I723 Aneurysm of iliac artery: Secondary | ICD-10-CM | POA: Insufficient documentation

## 2011-08-01 NOTE — Telephone Encounter (Signed)
error 

## 2011-08-07 ENCOUNTER — Encounter: Payer: Self-pay | Admitting: Internal Medicine

## 2011-08-07 ENCOUNTER — Ambulatory Visit: Payer: 59 | Admitting: Internal Medicine

## 2011-08-07 ENCOUNTER — Ambulatory Visit (INDEPENDENT_AMBULATORY_CARE_PROVIDER_SITE_OTHER): Payer: 59 | Admitting: Internal Medicine

## 2011-08-07 VITALS — BP 130/84 | HR 54 | Wt 237.4 lb

## 2011-08-07 DIAGNOSIS — I723 Aneurysm of iliac artery: Secondary | ICD-10-CM

## 2011-08-07 NOTE — Patient Instructions (Signed)
.  Share results with all MDs seen  

## 2011-08-07 NOTE — Progress Notes (Signed)
  Subjective:    Patient ID: Richard [name], male    DOB: 19-Jun-1946, 66 y.o.   MRN: [id number]  HPI An incidental finding during evaluation of nephrolithiasis was a left 2.9 cm common iliac artery aneurysm. He has had pain in the left lateral abdomen related to the stones.  He has not been monitoring his blood pressure at home, but has been some elevation which he attributes to concern about this issue. He is presently on amlodipine 5 mg daily and metoprolol 25 mg twice a day.  He may have had increase sodium intake over the holidays.  He denies chest pain, palpitations, or claudication symptoms. He does have some edema after standing on his feet for long periods of time.  He does lift weights; he states this is "mainly just chest  work".  New family history includes ruptured aortic aneurysm in a paternal uncle.    Review of Systems     Objective:   Physical Exam General appearance is one of good health and nourishment w/o distress.    Heart:  Normal rate and regular rhythm. S1 and S2 normal without gallop, murmur, click, rub S 4  Lungs:Chest clear to auscultation; no wheezes, rhonchi,rales ,or rubs present.No increased work of breathing.   Abdomen: bowel sounds normal, soft and non-tender without masses, organomegaly or hernias noted.  No guarding or rebound . No aortic aneurysms palpable; there were no renal artery bruits.  Skin:Warm & dry.     All pulses intact without  bruits .No ischemic skin changes             Assessment & Plan:

## 2011-08-07 NOTE — Assessment & Plan Note (Addendum)
The left common iliac aneurysm is asymptomatic. Most important is to control risk factors for vascular disease .These are Hypertension, Diabetes,  Cholesterol , lack of exercise  & smoking .Your BP goal = AVERAGE < 130/80. Avoid ingestion of  excess salt/sodium.Cook with pepper & other spices . Use the salt substitute "No Salt"(unless your potassium has been elevated) OR the Mrs Sharilyn Sites products to season food @ the table. Avoid foods which taste salty or "vinegary" as their sodium contentet will be high. If blood pressure remains above this goal; the amlodipine and/or metoprolol should be titrated upward. Consultation with a cardiovascular surgeon should be completed as precaution and to guide future monitor. Prior CT scan images  should be compared with the 07/25/11 CT scan directly if possible.

## 2011-08-27 ENCOUNTER — Encounter: Payer: Self-pay | Admitting: Vascular Surgery

## 2011-08-28 ENCOUNTER — Encounter: Payer: Self-pay | Admitting: Vascular Surgery

## 2011-08-28 ENCOUNTER — Ambulatory Visit (INDEPENDENT_AMBULATORY_CARE_PROVIDER_SITE_OTHER): Payer: 59 | Admitting: Vascular Surgery

## 2011-08-28 VITALS — BP 136/94 | HR 62 | Resp 16 | Ht 73.0 in | Wt 233.0 lb

## 2011-08-28 DIAGNOSIS — I745 Embolism and thrombosis of iliac artery: Secondary | ICD-10-CM

## 2011-08-28 NOTE — Progress Notes (Signed)
Subjective:     Patient ID: Richard [name], male   DOB: 10/04/1945, 66 y.o.   MRN: [id number]  HPI this 66 year old healthy male was found to have a left common iliac artery aneurysm on the CT scan performed last month. CT scan was performed because of renal calculi. The left common iliac artery aneurysm measured 2.95 cm in maximum diameter. It was not aware of this previously. He has not had abdominal or back symptoms other than associated with his renal calculi. He has no direct family history of abdominal or iliac aneurysms other than an uncle.  Past Medical History  Diagnosis Date  . Squamous cell carcinoma, face   . Nephrolithiasis   . Hyperlipidemia     LDL goal = < 100 based on NMR Lipoproprofile  . Fasting hyperglycemia     History  Substance Use Topics  . Smoking status: Never Smoker   . Smokeless tobacco: Not on file  . Alcohol Use: Yes      rarely    Family History  Problem Relation Age of Onset  . Heart attack Father 57     CBAG X5 vessels  . Heart disease Father     Heart Disease before age of  78  . Hyperlipidemia Father   . Hypertension Father   . Stroke Father   . Hypertension Brother   . Aortic aneurysm Paternal Uncle     AAA ruptured  . Heart disease Mother   . Hyperlipidemia Mother     Allergies  Allergen Reactions  . Lisinopril     Swelling of uvula    Current outpatient prescriptions:amLODipine (NORVASC) 5 MG tablet, TAKE 1 TABLET BY MOUTH DAILY, Disp: 30 tablet, Rfl: 3;  aspirin 81 MG EC tablet, Take 81 mg by mouth daily.  , Disp: , Rfl: ;  atorvastatin (LIPITOR) 20 MG tablet, TAKE 1 TABLET BY MOUTH EVERY MONDAY,WEDNESDAY,FRIDAY, AND SUNDAY AT 6 PM., Disp: 30 tablet, Rfl: 0;  Ibuprofen (ADVIL) 200 MG CAPS, Take by mouth daily as needed.  , Disp: , Rfl:  metoprolol tartrate (LOPRESSOR) 25 MG tablet, Take 1 tablet (25 mg total) by mouth 2 (two) times daily., Disp: 180 tablet, Rfl: 3;  Multiple Vitamin (MULTIVITAMIN PO), Take by mouth daily.  , Disp: ,  Rfl:   BP 136/94  Pulse 62  Resp 16  Ht 6\' 1"  (1.854 m)  Wt 233 lb (105.688 kg)  BMI 30.74 kg/m2  SpO2 98%  Body mass index is 30.74 kg/(m^2).         Review of Systems denies chest pain, dyspnea on exertion, PND, orthopnea, hemoptysis, lateralizing weakness, aphasia, amaurosis fugax, diplopia, blurred vision, syncope. Only positive findings are related to the GU tract with renal stones otherwise negative     Objective:   Physical Exam blood pressure 136/94 heart rate 62 respirations 16 General well-developed well-nourished male in no apparent stress alert and oriented x3 HEENT normal for age Lungs no rhonchi or wheezing Cardiovascular regular rhythm no murmurs carotid pulses 3+ audible bruits Abdomen soft nontender with no masses Musculoskeletal 3 major deformities Neurologic normal Lower extremity 3+ femoral and posterior tibial pulses palpable bilaterally Skin freer rashes  Today I reviewed the CT scan performed at [facility] and I agree that the patient does have an isolated left common iliac artery aneurysm measuring less than 3 cm in maximum diameter. There is no aortic aneurysm.     Assessment:     Isolated left common iliac artery aneurysm slightly less than 3.0  cm in maximum diameter-asymptomatic    Plan:     Return in one year with CT angiogram to further evaluate the small left common iliac artery aneurysm. If it grows and exceeds 3 cm in diameter it should be treated with stent grafting. I discussed this at length with the patient and his wife today. He should develop severe abdominal or lower quadrant pain should go to the emergency department and obtain a CT scan

## 2011-09-21 ENCOUNTER — Other Ambulatory Visit: Payer: Self-pay | Admitting: Internal Medicine

## 2011-11-27 ENCOUNTER — Other Ambulatory Visit: Payer: Self-pay | Admitting: Internal Medicine

## 2011-11-27 NOTE — Telephone Encounter (Signed)
Refill done.  

## 2012-02-07 ENCOUNTER — Other Ambulatory Visit: Payer: Self-pay | Admitting: Urology

## 2012-02-07 DIAGNOSIS — N200 Calculus of kidney: Secondary | ICD-10-CM

## 2012-02-19 ENCOUNTER — Other Ambulatory Visit: Payer: Self-pay | Admitting: Urology

## 2012-02-19 ENCOUNTER — Encounter (HOSPITAL_COMMUNITY): Payer: Self-pay | Admitting: Pharmacy Technician

## 2012-02-19 NOTE — Patient Instructions (Addendum)
20 Richard [name]  02/19/2012   Your procedure is scheduled on:  02/28/2012   Surgery 1100am-200pm  Report to [facility] at 0715am    Remember:   Do not eat food:After Midnight.  May have clear liquids:until Midnight .    Take these medicines the morning of surgery with A SIP OF WATER:    Do not wear jewelry, .  Do not wear lotions, powders, or perfumes.   Men may shave face and neck.  Do not bring valuables to the hospital.  Contacts, dentures or bridgework may not be worn into surgery.  Leave suitcase in the car. After surgery it may be brought to your room.  For patients admitted to the hospital, checkout time is 11:00 AM the day of discharge.     Special Instructions: CHG Shower Use Special Wash: 1/2 bottle night before surgery and 1/2 bottle morning of surgery. shower chin to toes with CHG.  Wash face and private parts with regular soap.    Please read over the following fact sheets that you were given: MRSA Information, coughing and deep breathing exercises

## 2012-02-20 ENCOUNTER — Encounter (HOSPITAL_COMMUNITY)
Admission: RE | Admit: 2012-02-20 | Discharge: 2012-02-20 | Disposition: A | Discharge status: Home / Self Care | Payer: 59 | Source: Ambulatory Visit | Attending: Urology | Admitting: Urology

## 2012-02-20 ENCOUNTER — Encounter (HOSPITAL_COMMUNITY): Payer: Self-pay

## 2012-02-20 HISTORY — DX: Unspecified osteoarthritis, unspecified site: M19.90

## 2012-02-20 HISTORY — DX: Essential (primary) hypertension: I10

## 2012-02-20 LAB — BASIC METABOLIC PANEL
BUN: 21 mg/dL (ref 6–23)
CO2: 29 mEq/L (ref 19–32)
Calcium: 9.6 mg/dL (ref 8.4–10.5)
Chloride: 103 mEq/L (ref 96–112)
Creatinine, Ser: 1.73 mg/dL — ABNORMAL HIGH (ref 0.50–1.35)
Glucose, Bld: 98 mg/dL (ref 70–99)

## 2012-02-20 LAB — CBC
HCT: 44.4 % (ref 39.0–52.0)
MCH: 26.8 pg (ref 26.0–34.0)
MCV: 80.3 fL (ref 78.0–100.0)
Platelets: 246 10*3/uL (ref 150–400)
RDW: 14.2 % (ref 11.5–15.5)

## 2012-02-20 NOTE — Progress Notes (Signed)
02/20/12 Called to office of Dr Annabell Howells and spoke with [facility] and reported Creatinine results of 1.73.

## 2012-02-21 NOTE — Progress Notes (Deleted)
Last EKG from office 12/05/10  Last office visit note - DR Eldridge Dace- 12/05/10 on chart  Last office visit note- Dr Clelia Croft- PCP- 10/23/11 on chart  Per office visit note of 12/05/10- pt decline to have ECHO done .

## 2012-02-21 NOTE — Progress Notes (Signed)
02/21/12 Dr aware Creatinine results. No further treatment per Gwyn.

## 2012-02-26 ENCOUNTER — Other Ambulatory Visit: Payer: Self-pay | Admitting: Radiology

## 2012-02-27 ENCOUNTER — Other Ambulatory Visit: Payer: Self-pay | Admitting: Radiology

## 2012-02-27 NOTE — H&P (Signed)
e Problems Problems  1. Diffuse Abdominal Pain 789.07 2. Nephrolithiasis 592.0 3. Nephrolithiasis Of The Left Kidney 592.0 4. Renal Insufficiency 593.9 5. Ureteral Stone 592.1 6. Urinary Frequency 788.41  History of Present Illness     Richard [name] returns today to discuss treatment of his left ureteral and renal stones.   He had symptoms at Christmas with severe pain, but really nothing since.   He was found to have left distal and proximal stones and a left renal stone.  He was also found to have a left iliac artery aneurysm that was 2.9cm.  He is currently free of symptoms, but he did have to take some meds for about a week 4 days ago.  He has had stones for some time and had 2 failed left lithotripsies about 6 years ago by Dr. Aldean Ast.  His Cr was up to 2.09 when seen on 7/2 from a baseline of  1.2 in 1/09.   Past Medical History Problems  1. History of  Abdominal Pain In The Left Lower Belly (LLQ) 789.04 2. History of  Acute Cystitis 595.0 3. History of  Aneurysm Of Left Internal Iliac Artery 442.2 4. History of  Blood In Urine 599.7 5. History of  Fever (Symptom) 780.60 6. History of  Hypercholesterolemia 272.0 7. History of  Hypertension 401.9 8. History of  Hypertension 401.9 9. History of  Nephrolithiasis V13.01  Surgical History Problems  1. History of  Lithotripsy  Current Meds 1. Advil TABS; Therapy: (Recorded:26Dec2012) to 2. AmLODIPine Besylate 5 MG Oral Tablet; Therapy: (Recorded:26Dec2012) to 3. Aspirin Low Dose 81 MG Oral Tablet; Therapy: (Recorded:02Jan2009) to 4. Atorvastatin Calcium 20 MG Oral Tablet; Therapy: (Recorded:26Dec2012) to 5. Hydrocodone-Acetaminophen 5-325 MG Oral Tablet; TAKE 1 TABLET EVERY 4 TO 6 HOURS AS  NEEDED; Therapy: 26Dec2012 to (Evaluate:02Jan2013); Last Rx:26Dec2012 6. Metoprolol Tartrate 25 MG Oral Tablet; Therapy: (Recorded:26Dec2012) to 7. Multi-Vitamin TABS; Therapy: (Recorded:02Jan2009) to  Allergies Medication  1. Lisinopril  TABS  Family History Problems  1. Paternal uncle's history of  Cardiac Failure 2. Paternal uncle's history of  Cardiac Failure 3. Family history of  Death In The Family Father 75- Heart failure 4. Family history of  Family Health Status - Mother's Age 30 5. Family history of  Family Health Status Number Of Children 1 son (75) 1 daughter (24)  Social History Problems  1. Alcohol Use 1 per month 2. Caffeine Use 1per day 3. Marital History - Currently Married 4. Never A Smoker 5. 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 History of  Tobacco Use V15.82  Review of Systems Genitourinary, constitutional, skin, eye, otolaryngeal, hematologic/lymphatic, cardiovascular, pulmonary, endocrine, musculoskeletal, gastrointestinal, neurological and psychiatric system(s) were reviewed and pertinent findings if present are noted.  Genitourinary: no hematuria.  Gastrointestinal: flank pain.  Cardiovascular: no chest pain.  Respiratory: no shortness of breath.    Vitals Vital Signs [Data Includes: Last 1 Day]  10Jul2013 08:30AM  Blood Pressure: 140 / 92 Temperature: 97.2 F Heart Rate: 60  Physical Exam Constitutional: Well nourished and well developed . No acute distress.  Pulmonary: No respiratory distress and normal respiratory rhythm and effort.  Cardiovascular: Heart rate and rhythm are normal . No peripheral edema.  Abdomen: The abdomen is soft and nontender. No masses are palpated. No CVA tenderness. No hernias are palpable. No hepatosplenomegaly noted.    Results/Data Urine [Data Includes: Last 1 Day]   10Jul2013  COLOR YELLOW   APPEARANCE CLEAR   SPECIFIC GRAVITY 1.020   pH 5.0  GLUCOSE NEG mg/dL  BILIRUBIN NEG   KETONE NEG mg/dL  BLOOD LARGE   PROTEIN NEG mg/dL  UROBILINOGEN 0.2 mg/dL  NITRITE NEG   LEUKOCYTE ESTERASE MOD   SQUAMOUS EPITHELIAL/HPF RARE   WBC 11-20 WBC/hpf  RBC 7-10 RBC/hpf  BACTERIA FEW   CRYSTALS NONE SEEN   CASTS NONE SEEN    I have reviewed  his CT for December and his recent KUB and renal US which showed no change in his stones and persistent hydronephrosis. Selected Results  BASIC METABOLIC PANEL 02Jul2013 03:37PM Bruning, Ashlyn  D AT [facility]-DO NOT F A X RESULTS. Contact Viki Hudson WITH ALL PENDING   Test Name Result Flag Reference  GLUCOSE 110 mg/dL H 45-40  BUN 17 mg/dL  9-81  CREATININE 1.91 mg/dL H 4.78-2.95  SODIUM 621 mEq/L  135-145  POTASSIUM 4.7 mEq/L  3.5-5.3  CHLORIDE 100 mEq/L  96-112  CO2 28 mEq/L  19-32  CALCIUM 9.4 mg/dL  3.0-86.5  Est GFR, African American 38 mL/min L   Est GFR, NonAfrican American 32 mL/min L   The estimated GFR is a calculation valid for adults (67 to 64 years old) that uses the CKD-EPI algorithm to adjust for age and sex. It is not to be used for children, pregnant women, hospitalized patients, patients on dialysis, or with rapidly changing kidney function. According to the NKDEP, eGFR >89 is normal, 60-89 shows mild impairment, 30-59 shows moderate impairment, 15-29 shows severe impairment and <15 is ESRD.   AU CT-STONE PROTOCOL 26Dec2012 12:00AM Alfredo Martinez   Test Name Result Flag Reference  ** RADIOLOGY REPORT BY [facility] ** ORIGINAL APPROVED BY: Rosealee Albee, M.D. ON: 07/25/2011 12:28:36   *RADIOLOGY REPORT*  Clinical Data: Renal calculi  CT ABDOMEN AND PELVIS WITHOUT CONTRAST  Technique: Multidetector CT imaging of the abdomen and pelvis was performed following the standard protocol without intravenous contrast.  Comparison: None  Findings: The lung bases appear clear.  No pericardial or pleural effusion identified.  There are no focal liver abnormalities identified.  The gallbladder appears within normal limits.  No biliary dilatation. The pancreas is negative.  Normal appearance of the spleen.  Both adrenal glands appear normal.  Multiple nonobstructing, punctate stones are identified within the right renal collecting system. No  right hydronephrosis or hydroureter. No ureterolithiasis. There are multiple left renal calculi. Within the inferior pole of the left kidney there is a stone which measures 1 cm, image 40. There is a left-sided hydronephrosis. In the left UPJ there is a stone measuring 1 cm, image 43. Within the distal left ureter there is a stone which measures 7.5 mm, image 78  Left-sided perinephric fat stranding is present.  There is no upper abdominal adenopathy.  The abdominal aorta has a normal caliber.  There is aneurysmal dilatation of the left common iliac artery which measures 2.9 cm, image 62.  No enlarged pelvic or inguinal lymph nodes identified.  Urinary bladder appears normal.  No free fluid or fluid collections within the abdomen or pelvis.  The stomach appears normal.  The small bowel loops are negative.  The colon is normal.  Review of the visualized osseous structures is significant for mild degenerative disc disease.  IMPRESSION:  1. Left UPJ stone measures 1 cm and results in moderate left hydronephrosis. 2. Distal left ureteral calculus measures 7.5 mm. 3. Nonobstructing, punctate right renal calculi.   BUN & CREATININE 27Jan2009 10:59AM Kimbrough, [place]  Performed By:  Marylu Lund   Test Name Result  Flag Reference  BUN 20.0 mg/dL  1.6-10.9  CREATININE 1.2 mg/dL  6.0-4.5   Assessment Assessed  1. Nephrolithiasis Of The Left Kidney 592.0 2. Ureteral Stone 592.1 3. Renal Insufficiency 593.9   He has left distal, proximal ureteral and left renal stones with renal insufficiency. He also has a left iliac artery aneurysm.   Plan Health Maintenance (V70.0)  1. UA With REFLEX  Done: 10Jul2013 08:19AM Nephrolithiasis Of The Left Kidney (592.0)  2. BUN & CREATININE  Requested for: 10Jul2013 Nephrolithiasis Of The Left Kidney (592.0), Ureteral Stone (592.1)  3. Follow-up Schedule Surgery Office  Follow-up  Requested for: 10Jul2013 4. URINE CULTURE  Requested for:  10Jul2013   He needs surgical therapy and has failed prior lithotripsy for the same stones. I discussed ureteroscopy and percutaneous nephrolithotomy.  I think a combination of ureteroscopy for the distal stone and PNCL for the renal and proximal stones which will allow [place] to avoid the aneurysm. I reviewed the risks of bleeding with possible transfusion, infection, ureteral injury possibly requiring stenting or surgery, renal injury or loss, injury to adjacent organs or the aneurysm, need for multiple procedures, thrombotic events and anesthetic complications. I am going to repeat a BUN/Cr today and get a urine culture. I will try to schedule him but he remains reluctant to consider therapy.

## 2012-02-28 ENCOUNTER — Encounter (HOSPITAL_COMMUNITY)
Admission: RE | Disposition: A | Discharge status: Home / Self Care | Payer: Self-pay | Source: Ambulatory Visit | Attending: Urology

## 2012-02-28 ENCOUNTER — Ambulatory Visit (HOSPITAL_COMMUNITY): Payer: 59 | Admitting: Registered Nurse

## 2012-02-28 ENCOUNTER — Encounter (HOSPITAL_COMMUNITY): Payer: Self-pay | Admitting: Registered Nurse

## 2012-02-28 ENCOUNTER — Observation Stay (HOSPITAL_COMMUNITY)
Admission: RE | Admit: 2012-02-28 | Discharge: 2012-02-29 | Disposition: A | Discharge status: Home / Self Care | Payer: 59 | Source: Ambulatory Visit | Attending: Urology | Admitting: Urology

## 2012-02-28 ENCOUNTER — Ambulatory Visit (HOSPITAL_COMMUNITY)
Admission: RE | Admit: 2012-02-28 | Discharge: 2012-02-28 | Disposition: A | Discharge status: Home / Self Care | Payer: 59 | Source: Ambulatory Visit | Attending: Urology | Admitting: Urology

## 2012-02-28 ENCOUNTER — Observation Stay (HOSPITAL_COMMUNITY): Payer: 59

## 2012-02-28 ENCOUNTER — Encounter (HOSPITAL_COMMUNITY): Payer: Self-pay | Admitting: *Deleted

## 2012-02-28 ENCOUNTER — Encounter (HOSPITAL_COMMUNITY): Payer: Self-pay

## 2012-02-28 VITALS — BP 127/85 | HR 66 | Temp 98.7°F | Resp 11 | Ht 73.0 in | Wt 233.0 lb

## 2012-02-28 DIAGNOSIS — N289 Disorder of kidney and ureter, unspecified: Secondary | ICD-10-CM | POA: Insufficient documentation

## 2012-02-28 DIAGNOSIS — N200 Calculus of kidney: Secondary | ICD-10-CM

## 2012-02-28 DIAGNOSIS — I723 Aneurysm of iliac artery: Secondary | ICD-10-CM | POA: Insufficient documentation

## 2012-02-28 DIAGNOSIS — I10 Essential (primary) hypertension: Secondary | ICD-10-CM | POA: Insufficient documentation

## 2012-02-28 DIAGNOSIS — N201 Calculus of ureter: Secondary | ICD-10-CM | POA: Insufficient documentation

## 2012-02-28 DIAGNOSIS — Z79899 Other long term (current) drug therapy: Secondary | ICD-10-CM | POA: Insufficient documentation

## 2012-02-28 DIAGNOSIS — R350 Frequency of micturition: Secondary | ICD-10-CM | POA: Insufficient documentation

## 2012-02-28 DIAGNOSIS — E7800 Pure hypercholesterolemia, unspecified: Secondary | ICD-10-CM | POA: Insufficient documentation

## 2012-02-28 HISTORY — PX: NEPHROLITHOTOMY: SHX5134

## 2012-02-28 HISTORY — PX: CYSTOSCOPY WITH URETEROSCOPY: SHX5123

## 2012-02-28 LAB — TYPE AND SCREEN: Antibody Screen: NEGATIVE

## 2012-02-28 LAB — CBC WITH DIFFERENTIAL/PLATELET
Eosinophils Absolute: 0.2 10*3/uL (ref 0.0–0.7)
Eosinophils Relative: 2 % (ref 0–5)
HCT: 44.5 % (ref 39.0–52.0)
Lymphocytes Relative: 35 % (ref 12–46)
Lymphs Abs: 3.1 10*3/uL (ref 0.7–4.0)
MCH: 26.9 pg (ref 26.0–34.0)
MCV: 80.8 fL (ref 78.0–100.0)
Monocytes Absolute: 0.8 10*3/uL (ref 0.1–1.0)
Platelets: 192 10*3/uL (ref 150–400)
RBC: 5.51 MIL/uL (ref 4.22–5.81)
RDW: 14.6 % (ref 11.5–15.5)

## 2012-02-28 LAB — PROTIME-INR: Prothrombin Time: 13.7 seconds (ref 11.6–15.2)

## 2012-02-28 LAB — BASIC METABOLIC PANEL
CO2: 25 mEq/L (ref 19–32)
Calcium: 9.4 mg/dL (ref 8.4–10.5)
Chloride: 105 mEq/L (ref 96–112)
Creatinine, Ser: 1.59 mg/dL — ABNORMAL HIGH (ref 0.50–1.35)
Glucose, Bld: 108 mg/dL — ABNORMAL HIGH (ref 70–99)

## 2012-02-28 LAB — HEMOGLOBIN AND HEMATOCRIT, BLOOD: HCT: 36.2 % — ABNORMAL LOW (ref 39.0–52.0)

## 2012-02-28 LAB — ABO/RH: ABO/RH(D): A POS

## 2012-02-28 SURGERY — NEPHROLITHOTOMY PERCUTANEOUS
Anesthesia: General | Site: Back | Laterality: Left | Wound class: Clean Contaminated

## 2012-02-28 MED ORDER — HYOSCYAMINE SULFATE 0.125 MG SL SUBL
0.1250 mg | SUBLINGUAL_TABLET | SUBLINGUAL | Status: DC | PRN
  Filled 2012-02-28: qty 1

## 2012-02-28 MED ORDER — SUFENTANIL CITRATE 50 MCG/ML IV SOLN
INTRAVENOUS | Status: DC | PRN
  Administered 2012-02-28 (×3): 10 ug via INTRAVENOUS

## 2012-02-28 MED ORDER — FENTANYL CITRATE 0.05 MG/ML IJ SOLN
INTRAMUSCULAR | Status: AC | PRN
  Administered 2012-02-28: 100 ug via INTRAVENOUS
  Administered 2012-02-28: 50 ug via INTRAVENOUS

## 2012-02-28 MED ORDER — ATORVASTATIN CALCIUM 20 MG PO TABS
20.0000 mg | ORAL_TABLET | ORAL | Status: DC
  Administered 2012-02-29: 20 mg via ORAL
  Filled 2012-02-28: qty 1

## 2012-02-28 MED ORDER — SODIUM CHLORIDE 0.9 % IR SOLN
Status: DC | PRN
  Administered 2012-02-28: 7000 mL

## 2012-02-28 MED ORDER — FENTANYL CITRATE 0.05 MG/ML IJ SOLN
INTRAMUSCULAR | Status: AC
  Filled 2012-02-28: qty 2

## 2012-02-28 MED ORDER — LIDOCAINE HCL (CARDIAC) 20 MG/ML IV SOLN
INTRAVENOUS | Status: DC | PRN
  Administered 2012-02-28: 75 mg via INTRAVENOUS

## 2012-02-28 MED ORDER — PROMETHAZINE HCL 25 MG/ML IJ SOLN: 6.2500 mg | INTRAMUSCULAR | Status: DC | PRN

## 2012-02-28 MED ORDER — METOPROLOL TARTRATE 25 MG PO TABS
25.0000 mg | ORAL_TABLET | Freq: Two times a day (BID) | ORAL | Status: DC
  Administered 2012-02-28 – 2012-02-29 (×2): 25 mg via ORAL
  Filled 2012-02-28 (×3): qty 1

## 2012-02-28 MED ORDER — GLYCOPYRROLATE 0.2 MG/ML IJ SOLN
INTRAMUSCULAR | Status: DC | PRN
  Administered 2012-02-28: .4 mg via INTRAVENOUS

## 2012-02-28 MED ORDER — HYDROMORPHONE HCL PF 1 MG/ML IJ SOLN
0.2500 mg | INTRAMUSCULAR | Status: DC | PRN
Start: 2012-02-28 — End: 2012-02-28

## 2012-02-28 MED ORDER — ZOLPIDEM TARTRATE 5 MG PO TABS: 5.0000 mg | ORAL_TABLET | Freq: Every evening | ORAL | Status: DC | PRN

## 2012-02-28 MED ORDER — SUCCINYLCHOLINE CHLORIDE 20 MG/ML IJ SOLN
INTRAMUSCULAR | Status: DC | PRN
  Administered 2012-02-28: 100 mg via INTRAVENOUS

## 2012-02-28 MED ORDER — HYDROMORPHONE HCL PF 1 MG/ML IJ SOLN
0.5000 mg | INTRAMUSCULAR | Status: DC | PRN
Start: 2012-02-28 — End: 2012-02-29
  Administered 2012-02-28 (×2): 0.5 mg via INTRAVENOUS
  Filled 2012-02-28 (×3): qty 1

## 2012-02-28 MED ORDER — LIDOCAINE HCL 4 % MT SOLN
OROMUCOSAL | Status: DC | PRN
  Administered 2012-02-28: 4 mL via TOPICAL

## 2012-02-28 MED ORDER — LACTATED RINGERS IV SOLN
INTRAVENOUS | Status: DC | PRN
  Administered 2012-02-28 (×3): via INTRAVENOUS

## 2012-02-28 MED ORDER — KCL IN DEXTROSE-NACL 20-5-0.45 MEQ/L-%-% IV SOLN
INTRAVENOUS | Status: DC
  Administered 2012-02-28 – 2012-02-29 (×2): via INTRAVENOUS
  Filled 2012-02-28 (×4): qty 1000

## 2012-02-28 MED ORDER — IOHEXOL 300 MG/ML  SOLN
INTRAMUSCULAR | Status: AC
  Filled 2012-02-28: qty 3

## 2012-02-28 MED ORDER — IOHEXOL 300 MG/ML  SOLN
INTRAMUSCULAR | Status: DC | PRN
  Administered 2012-02-28: 10 mL

## 2012-02-28 MED ORDER — LIDOCAINE HCL 1 % IJ SOLN
INTRAMUSCULAR | Status: AC
  Filled 2012-02-28: qty 20

## 2012-02-28 MED ORDER — ONDANSETRON HCL 4 MG/2ML IJ SOLN
INTRAMUSCULAR | Status: DC | PRN
  Administered 2012-02-28: 4 mg via INTRAVENOUS

## 2012-02-28 MED ORDER — CIPROFLOXACIN IN D5W 400 MG/200ML IV SOLN: 400.0000 mg | Freq: Once | INTRAVENOUS | Status: DC

## 2012-02-28 MED ORDER — PROPOFOL 10 MG/ML IV EMUL
INTRAVENOUS | Status: DC | PRN
  Administered 2012-02-28: 200 mg via INTRAVENOUS

## 2012-02-28 MED ORDER — FENTANYL CITRATE 0.05 MG/ML IJ SOLN
INTRAMUSCULAR | Status: AC
  Filled 2012-02-28: qty 4

## 2012-02-28 MED ORDER — OXYCODONE-ACETAMINOPHEN 5-325 MG PO TABS
1.0000 | ORAL_TABLET | ORAL | Status: DC | PRN
  Administered 2012-02-29 (×2): 1 via ORAL
  Administered 2012-02-29: 2 via ORAL
  Filled 2012-02-28 (×2): qty 2

## 2012-02-28 MED ORDER — IOHEXOL 300 MG/ML  SOLN
10.0000 mL | Freq: Once | INTRAMUSCULAR | Status: AC | PRN
  Administered 2012-02-28: 10 mL

## 2012-02-28 MED ORDER — MIDAZOLAM HCL 2 MG/2ML IJ SOLN
INTRAMUSCULAR | Status: AC
  Filled 2012-02-28: qty 6

## 2012-02-28 MED ORDER — BISACODYL 10 MG RE SUPP: 10.0000 mg | Freq: Every day | RECTAL | Status: DC | PRN

## 2012-02-28 MED ORDER — ONDANSETRON HCL 4 MG/2ML IJ SOLN: 4.0000 mg | INTRAMUSCULAR | Status: DC | PRN

## 2012-02-28 MED ORDER — ACETAMINOPHEN 10 MG/ML IV SOLN
1000.0000 mg | Freq: Four times a day (QID) | INTRAVENOUS | Status: DC
  Administered 2012-02-28 – 2012-02-29 (×3): 1000 mg via INTRAVENOUS
  Filled 2012-02-28 (×4): qty 100

## 2012-02-28 MED ORDER — LACTATED RINGERS IV SOLN: INTRAVENOUS | Status: DC

## 2012-02-28 MED ORDER — EPHEDRINE SULFATE 50 MG/ML IJ SOLN
INTRAMUSCULAR | Status: DC | PRN
  Administered 2012-02-28: 5 mg via INTRAVENOUS

## 2012-02-28 MED ORDER — DOCUSATE SODIUM 100 MG PO CAPS
100.0000 mg | ORAL_CAPSULE | Freq: Two times a day (BID) | ORAL | Status: DC
  Administered 2012-02-28 – 2012-02-29 (×2): 100 mg via ORAL
  Filled 2012-02-28 (×3): qty 1

## 2012-02-28 MED ORDER — HETASTARCH-ELECTROLYTES 6 % IV SOLN
INTRAVENOUS | Status: DC | PRN
  Administered 2012-02-28: 12:00:00 via INTRAVENOUS

## 2012-02-28 MED ORDER — ROCURONIUM BROMIDE 100 MG/10ML IV SOLN
INTRAVENOUS | Status: DC | PRN
  Administered 2012-02-28: 2 mg via INTRAVENOUS
  Administered 2012-02-28: 38 mg via INTRAVENOUS
  Administered 2012-02-28: 20 mg via INTRAVENOUS

## 2012-02-28 MED ORDER — ACETAMINOPHEN 10 MG/ML IV SOLN
INTRAVENOUS | Status: DC | PRN
  Administered 2012-02-28: 1000 mg via INTRAVENOUS

## 2012-02-28 MED ORDER — MIDAZOLAM HCL 5 MG/5ML IJ SOLN
INTRAMUSCULAR | Status: DC | PRN
  Administered 2012-02-28: 2 mg via INTRAVENOUS

## 2012-02-28 MED ORDER — CIPROFLOXACIN IN D5W 400 MG/200ML IV SOLN
400.0000 mg | INTRAVENOUS | Status: AC
  Administered 2012-02-28: 400 mg via INTRAVENOUS
  Filled 2012-02-28: qty 200

## 2012-02-28 MED ORDER — ACETAMINOPHEN 10 MG/ML IV SOLN
INTRAVENOUS | Status: AC
  Filled 2012-02-28: qty 100

## 2012-02-28 MED ORDER — MIDAZOLAM HCL 5 MG/5ML IJ SOLN
INTRAMUSCULAR | Status: AC | PRN
  Administered 2012-02-28: 1 mg via INTRAVENOUS
  Administered 2012-02-28: 2 mg via INTRAVENOUS

## 2012-02-28 MED ORDER — SODIUM CHLORIDE 0.9 % IV SOLN
Freq: Once | INTRAVENOUS | Status: AC
  Administered 2012-02-28: 08:00:00 via INTRAVENOUS

## 2012-02-28 MED ORDER — PHENYLEPHRINE HCL 10 MG/ML IJ SOLN
INTRAMUSCULAR | Status: DC | PRN
  Administered 2012-02-28 (×2): 40 ug via INTRAVENOUS

## 2012-02-28 MED ORDER — CIPROFLOXACIN IN D5W 400 MG/200ML IV SOLN
400.0000 mg | Freq: Two times a day (BID) | INTRAVENOUS | Status: DC
  Administered 2012-02-28 – 2012-02-29 (×2): 400 mg via INTRAVENOUS
  Filled 2012-02-28 (×2): qty 200

## 2012-02-28 MED ORDER — BELLADONNA ALKALOIDS-OPIUM 16.2-60 MG RE SUPP
RECTAL | Status: AC
  Filled 2012-02-28: qty 1

## 2012-02-28 MED ORDER — NEOSTIGMINE METHYLSULFATE 1 MG/ML IJ SOLN
INTRAMUSCULAR | Status: DC | PRN
  Administered 2012-02-28: 2 mg via INTRAVENOUS

## 2012-02-28 MED ORDER — AMLODIPINE BESYLATE 5 MG PO TABS
5.0000 mg | ORAL_TABLET | Freq: Every morning | ORAL | Status: DC
  Administered 2012-02-29: 5 mg via ORAL
  Filled 2012-02-28: qty 1

## 2012-02-28 MED ORDER — ACETAMINOPHEN 325 MG PO TABS: 650.0000 mg | ORAL_TABLET | ORAL | Status: DC | PRN

## 2012-02-28 MED ORDER — BELLADONNA ALKALOIDS-OPIUM 16.2-60 MG RE SUPP
RECTAL | Status: DC | PRN
  Administered 2012-02-28: 1 via RECTAL

## 2012-02-28 SURGICAL SUPPLY — 48 items
APL SKNCLS STERI-STRIP NONHPOA (GAUZE/BANDAGES/DRESSINGS) ×4
BAG URINE DRAINAGE (UROLOGICAL SUPPLIES) ×3 IMPLANT
BAG URO CATCHER STRL LF (DRAPE) ×3 IMPLANT
BASKET LASER NITINOL 1.9FR (BASKET) ×2 IMPLANT
BASKET ZERO TIP NITINOL 2.4FR (BASKET) ×4 IMPLANT
BENZOIN TINCTURE PRP APPL 2/3 (GAUZE/BANDAGES/DRESSINGS) ×10 IMPLANT
BLADE SURG 15 STRL LF DISP TIS (BLADE) ×2 IMPLANT
BLADE SURG 15 STRL SS (BLADE) ×3
BSKT STON RTRVL 120 1.9FR (BASKET)
BSKT STON RTRVL ZERO TP 2.4FR (BASKET) ×4
CATCHER STONE W/TUBE ADAPTER (UROLOGICAL SUPPLIES) ×2 IMPLANT
CATH AINSWORTH 30CC 24FR (CATHETERS) ×1 IMPLANT
CATH FOLEY 2W COUNCIL 20FR 5CC (CATHETERS) IMPLANT
CATH ROBINSON RED A/P 20FR (CATHETERS) IMPLANT
CATH URET 5FR 28IN OPEN ENDED (CATHETERS) ×3 IMPLANT
CATH X-FORCE N30 NEPHROSTOMY (TUBING) ×3 IMPLANT
CLOTH BEACON ORANGE TIMEOUT ST (SAFETY) ×3 IMPLANT
COVER SURGICAL LIGHT HANDLE (MISCELLANEOUS) ×3 IMPLANT
DRAPE C-ARM 42X72 X-RAY (DRAPES) ×3 IMPLANT
DRAPE CAMERA CLOSED 9X96 (DRAPES) ×3 IMPLANT
DRAPE LINGEMAN PERC (DRAPES) ×3 IMPLANT
DRAPE SURG IRRIG POUCH 19X23 (DRAPES) ×3 IMPLANT
DRSG TEGADERM 8X12 (GAUZE/BANDAGES/DRESSINGS) ×4 IMPLANT
GLOVE SURG SS PI 8.0 STRL IVOR (GLOVE) ×3 IMPLANT
GOWN PREVENTION PLUS XLARGE (GOWN DISPOSABLE) ×3 IMPLANT
GOWN STRL REIN XL XLG (GOWN DISPOSABLE) ×3 IMPLANT
KIT BASIN OR (CUSTOM PROCEDURE TRAY) ×3 IMPLANT
LASER FIBER DISP (UROLOGICAL SUPPLIES) ×2 IMPLANT
LASER FIBER DISP 1000U (UROLOGICAL SUPPLIES) IMPLANT
MANIFOLD NEPTUNE II (INSTRUMENTS) ×3 IMPLANT
NS IRRIG 1000ML POUR BTL (IV SOLUTION) ×3 IMPLANT
PACK BASIC VI WITH GOWN DISP (CUSTOM PROCEDURE TRAY) ×3 IMPLANT
PACK CYSTO (CUSTOM PROCEDURE TRAY) ×3 IMPLANT
PAD ABD 7.5X8 STRL (GAUZE/BANDAGES/DRESSINGS) ×4 IMPLANT
PROBE LITHOCLAST ULTRA 3.8X403 (UROLOGICAL SUPPLIES) ×2 IMPLANT
PROBE PNEUMATIC 1.0MMX570MM (UROLOGICAL SUPPLIES) ×2 IMPLANT
SET IRRIG Y TYPE TUR BLADDER L (SET/KITS/TRAYS/PACK) ×3 IMPLANT
SET WARMING FLUID IRRIGATION (MISCELLANEOUS) ×3 IMPLANT
SPONGE GAUZE 4X4 12PLY (GAUZE/BANDAGES/DRESSINGS) ×2 IMPLANT
SPONGE LAP 4X18 X RAY DECT (DISPOSABLE) ×3 IMPLANT
STONE CATCHER W/TUBE ADAPTER (UROLOGICAL SUPPLIES) IMPLANT
SUT SILK 2 0 30  PSL (SUTURE) ×1
SUT SILK 2 0 30 PSL (SUTURE) ×2 IMPLANT
SYR 20CC LL (SYRINGE) ×5 IMPLANT
SYRINGE 10CC LL (SYRINGE) ×3 IMPLANT
TOWEL OR NON WOVEN STRL DISP B (DISPOSABLE) ×3 IMPLANT
TRAY FOLEY CATH 14FRSI W/METER (CATHETERS) ×3 IMPLANT
TUBING CONNECTING 10 (TUBING) ×7 IMPLANT

## 2012-02-28 NOTE — Interval H&P Note (Signed)
History and Physical Interval Note:  02/28/2012 10:51 AM  Richard [name]  has presented today for surgery, with the diagnosis of left proximal stone and distal stone, left renal stone  The various methods of treatment have been discussed with the patient and family. After consideration of risks, benefits and other options for treatment, the patient has consented to  Procedure(s) (LRB): NEPHROLITHOTOMY PERCUTANEOUS (Left) CYSTOSCOPY/RETROGRADE/URETEROSCOPY/STONE EXTRACTION WITH BASKET (Left) as a surgical intervention .  The patient's history has been reviewed, patient examined, no change in status, stable for surgery.  I have reviewed the patient's chart and labs.  Questions were answered to the patient's satisfaction.     [name] J

## 2012-02-28 NOTE — Transfer of Care (Signed)
Immediate Anesthesia Transfer of Care Note  Patient: Richard [name]  Procedure(s) Performed: Procedure(s) (LRB): NEPHROLITHOTOMY PERCUTANEOUS (Left) HOLMIUM LASER APPLICATION (Left) CYSTOSCOPY WITH URETEROSCOPY (Left)  Patient Location: PACU  Anesthesia Type: General  Level of Consciousness: awake, alert , oriented and patient cooperative  Airway & Oxygen Therapy: Patient Spontanous Breathing and Patient connected to face mask oxygen  Post-op Assessment: Report given to PACU RN, Post -op Vital signs reviewed and stable and Patient moving all extremities X 4  Post vital signs: stable  Complications: No apparent anesthesia complications

## 2012-02-28 NOTE — OR Nursing (Signed)
Left kidney stones and Left ureteral stones sent with Dr. Annabell Howells.

## 2012-02-28 NOTE — Brief Op Note (Signed)
02/28/2012  2:17 PM  PATIENT:  Richard [name]  66 y.o. male  PRE-OPERATIVE DIAGNOSIS:  left proximal stone and distal stone, left renal stone  POST-OPERATIVE DIAGNOSIS:  Left ureteral and renal calculi  PROCEDURE:  Procedure(s) (LRB): NEPHROLITHOTOMY PERCUTANEOUS (Left) ANTEGRADE URETEROLITHOTOMY(left) HOLMIUM LASER APPLICATION (Left) CYSTOSCOPY WITH URETEROSCOPY with HOLMIUM LASERTRIPSY (Left)  SURGEON:  Surgeon(s) and Role:    * Anner Crete, MD - Primary  PHYSICIAN ASSISTANT:   ASSISTANTS: none   ANESTHESIA:   general  EBL:  Total I/O In: 2625 [I.V.:2125; IV Piggyback:500] Out: -   BLOOD ADMINISTERED:none  DRAINS: Urinary Catheter (Foley) and 51fr Nephrostomy tube and 67fr Kompy catheter   LOCAL MEDICATIONS USED:  NONE  SPECIMEN:  Source of Specimen:  stones from left kidney and ureter  DISPOSITION OF SPECIMEN:  To family to bring for analysis.  COUNTS:  YES  TOURNIQUET:  * No tourniquets in log *  DICTATION: .Other Dictation: Dictation Number [id number]  PLAN OF CARE: Admit for overnight observation  PATIENT DISPOSITION:  PACU - hemodynamically stable.   Delay start of Pharmacological VTE agent (>24hrs) due to surgical blood loss or risk of bleeding: yes

## 2012-02-28 NOTE — Progress Notes (Signed)
Patient ID: Richard [name], male   DOB: [birth year], 66 y.o.   MRN: [id number]  Richard [name] is doing well with minimal post op pain.   He has some pericatheter oozing but his foley drainage is clear and the nephrostomy drainage is light pink.  His Hgb is 11.9.  Will D/C foley in AM and check labs and a KUB.

## 2012-02-28 NOTE — Procedures (Signed)
Successful placement of left sided 5 Fr catheter for PNL access.  Tip of catheter is within the urinary bladder.  No immediate complications.    

## 2012-02-28 NOTE — H&P (Signed)
Richard [name] is an 66 y.o. male.   Chief Complaint: symptomatic left renal stones - patient presents to IR for percutaneous nephrostomy for access pre-lithotomy.  HPI: see urology note below :  Anner Crete, MD Physician Signed Urology H&P 02/27/2012 3:17 PM  Related encounter: Admission (Current) from 02/28/2012 in [facility]   e Problems Problems  1. Diffuse Abdominal Pain 789.07 2. Nephrolithiasis 592.0 3. Nephrolithiasis Of The Left Kidney 592.0 4. Renal Insufficiency 593.9 5. Ureteral Stone 592.1 6. Urinary Frequency 788.41   History of Present Illness     Richard [name] returns today to discuss treatment of his left ureteral and renal stones.   He had symptoms at Christmas with severe pain, but really nothing since.   He was found to have left distal and proximal stones and a left renal stone.  He was also found to have a left iliac artery aneurysm that was 2.9cm.  He is currently free of symptoms, but he did have to take some meds for about a week 4 days ago.  He has had stones for some time and had 2 failed left lithotripsies about 6 years ago by Dr. Aldean Ast.  His Cr was up to 2.09 when seen on 7/2 from a baseline of  1.2 in 1/09.   Past Medical History Problems  1. History of  Abdominal Pain In The Left Lower Belly (LLQ) 789.04 2. History of  Acute Cystitis 595.0 3. History of  Aneurysm Of Left Internal Iliac Artery 442.2 4. History of  Blood In Urine 599.7 5. History of  Fever (Symptom) 780.60 6. History of  Hypercholesterolemia 272.0 7. History of  Hypertension 401.9 8. History of  Hypertension 401.9 9. History of  Nephrolithiasis V13.01   Surgical History Problems  1. History of  Lithotripsy   Current Meds 1. Advil TABS; Therapy: (Recorded:26Dec2012) to 2. AmLODIPine Besylate 5 MG Oral Tablet; Therapy: (Recorded:26Dec2012) to 3. Aspirin Low Dose 81 MG Oral Tablet; Therapy: (Recorded:02Jan2009) to 4. Atorvastatin Calcium 20 MG Oral Tablet; Therapy:  (Recorded:26Dec2012) to 5. Hydrocodone-Acetaminophen 5-325 MG Oral Tablet; TAKE 1 TABLET EVERY 4 TO 6 HOURS AS  NEEDED; Therapy: 26Dec2012 to (Evaluate:02Jan2013); Last Rx:26Dec2012 6. Metoprolol Tartrate 25 MG Oral Tablet; Therapy: (Recorded:26Dec2012) to 7. Multi-Vitamin TABS; Therapy: (Recorded:02Jan2009) to   Allergies Medication  1. Lisinopril TABS   Family History Problems  1. Paternal uncle's history of  Cardiac Failure 2. Paternal uncle's history of  Cardiac Failure 3. Family history of  Death In The Family Father 61- Heart failure 4. Family history of  Family Health Status - Mother's Age 42 5. Family history of  Family Health Status Number Of Children 1 son (65) 1 daughter (4)   Social History Problems  1. Alcohol Use 1 per month 2. Caffeine Use 1per day 3. Marital History - Currently Married 4. Never A Smoker 5. 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 History of  Tobacco Use V15.82   Review of Systems Genitourinary, constitutional, skin, eye, otolaryngeal, hematologic/lymphatic, cardiovascular, pulmonary, endocrine, musculoskeletal, gastrointestinal, neurological and psychiatric system(s) were reviewed and pertinent findings if present are noted.  Genitourinary: no hematuria.  Gastrointestinal: flank pain.  Cardiovascular: no chest pain.  Respiratory: no shortness of breath.     Vitals Vital Signs [Data Includes: Last 1 Day]  10Jul2013 08:30AM  Blood Pressure: 140 / 92 Temperature: 97.2 F Heart Rate: 60   Physical Exam Constitutional: Well nourished and well developed . No acute distress.  Pulmonary: No respiratory distress and normal  respiratory rhythm and effort.  Cardiovascular: Heart rate and rhythm are normal . No peripheral edema.  Abdomen: The abdomen is soft and nontender. No masses are palpated. No CVA tenderness. No hernias are palpable. No hepatosplenomegaly noted.     Results/Data Urine [Data Includes: Last 1 Day]    10Jul2013   COLOR   YELLOW    APPEARANCE  CLEAR    SPECIFIC GRAVITY  1.020    pH  5.0    GLUCOSE  NEG mg/dL   BILIRUBIN  NEG    KETONE  NEG mg/dL   BLOOD  LARGE    PROTEIN  NEG mg/dL   UROBILINOGEN  0.2 mg/dL   NITRITE  NEG    LEUKOCYTE ESTERASE  MOD    SQUAMOUS EPITHELIAL/HPF  RARE    WBC  11-20 WBC/hpf   RBC  7-10 RBC/hpf   BACTERIA  FEW    CRYSTALS  NONE SEEN    CASTS  NONE SEEN      I have reviewed his CT for December and his recent KUB and renal US which showed no change in his stones and persistent hydronephrosis.  Selected Results  BASIC METABOLIC PANEL  02Jul2013 03:37PM  Bruning, Ashlyn   D AT [facility]-DO NOT F A X RESULTS. Contact Viki Hudson WITH  ALL PENDING      Test Name  Result  Flag  Reference   GLUCOSE  110 mg/dL  H  16-10   BUN  17 mg/dL    9-60   CREATININE  4.54 mg/dL  H  0.98-1.19   SODIUM  137 mEq/L    135-145   POTASSIUM  4.7 mEq/L    3.5-5.3   CHLORIDE  100 mEq/L    96-112   CO2  28 mEq/L    19-32   CALCIUM  9.4 mg/dL    1.4-78.2   Est GFR, African American  38 mL/min  L     Est GFR, NonAfrican American  32 mL/min  L     The estimated GFR is a calculation valid for adults (18 to 64 years  old) that uses the CKD-EPI algorithm to adjust for age and sex. It is not to be used for children, pregnant women, hospitalized patients, patients on dialysis, or with rapidly changing kidney function. According to the NKDEP, eGFR >89 is normal, 60-89 shows mild impairment, 30-59 shows moderate impairment, 15-29 shows severe impairment and <15 is ESRD.      AU CT-STONE PROTOCOL  26Dec2012 12:00AM  Alfredo Martinez      Test Name  Result  Flag  Reference   ** RADIOLOGY REPORT BY [facility] **  ORIGINAL APPROVED BY: Rosealee Albee, M.D. ON: 07/25/2011 12:28:36    *RADIOLOGY REPORT*  Clinical Data: Renal calculi  CT ABDOMEN AND PELVIS WITHOUT CONTRAST  Technique: Multidetector CT imaging of the abdomen and pelvis was performed following the standard protocol  without intravenous contrast.  Comparison: None  Findings: The lung bases appear clear.  No pericardial or pleural effusion identified.  There are no focal liver abnormalities identified.  The gallbladder appears within normal limits.  No biliary dilatation. The pancreas is negative.  Normal appearance of the spleen.  Both adrenal glands appear normal.  Multiple nonobstructing, punctate stones are identified within the right renal collecting system. No right hydronephrosis or hydroureter. No ureterolithiasis. There are multiple left renal calculi. Within the inferior pole of the left kidney there is a stone which measures 1 cm, image 40. There is  a left-sided hydronephrosis. In the left UPJ there is a stone measuring 1 cm, image 43. Within the distal left ureter there is a stone which measures 7.5 mm, image 78  Left-sided perinephric fat stranding is present.  There is no upper abdominal adenopathy.  The abdominal aorta has a normal caliber.  There is aneurysmal dilatation of the left common iliac artery which measures 2.9 cm, image 62.  No enlarged pelvic or inguinal lymph nodes identified.  Urinary bladder appears normal.  No free fluid or fluid collections within the abdomen or pelvis.  The stomach appears normal.  The small bowel loops are negative.  The colon is normal.  Review of the visualized osseous structures is significant for mild degenerative disc disease.  IMPRESSION:  1. Left UPJ stone measures 1 cm and results in moderate left hydronephrosis. 2. Distal left ureteral calculus measures 7.5 mm. 3. Nonobstructing, punctate right renal calculi.      BUN & CREATININE  27Jan2009 10:59AM  K[place], [place]   Performed By:  Marylu Lund      Test Name  Result  Flag  Reference   BUN  20.0 mg/dL    1.6-10.9   CREATININE  1.2 mg/dL    6.0-4.5    Assessment Assessed  1. Nephrolithiasis Of The Left Kidney 592.0 2. Ureteral Stone 592.1 3. Renal  Insufficiency 593.9    He has left distal, proximal ureteral and left renal stones with renal insufficiency. He also has a left iliac artery aneurysm.    Plan Health Maintenance (V70.0)  1. UA With REFLEX  Done: 10Jul2013 08:19AM Nephrolithiasis Of The Left Kidney (592.0)  2. BUN & CREATININE  Requested for: 10Jul2013 Nephrolithiasis Of The Left Kidney (592.0), Ureteral Stone (592.1)  3. Follow-up Schedule Surgery Office  Follow-up  Requested for: 10Jul2013 4. URINE CULTURE  Requested for: 10Jul2013    He needs surgical therapy and has failed prior lithotripsy for the same stones. I discussed ureteroscopy and percutaneous nephrolithotomy.  I think a combination of ureteroscopy for the distal stone and PNCL for the renal and proximal stones which will allow [place] to avoid the aneurysm. I reviewed the risks of bleeding with possible transfusion, infection, ureteral injury possibly requiring stenting or surgery, renal injury or loss, injury to adjacent organs or the aneurysm, need for multiple procedures, thrombotic events and anesthetic complications. I am going to repeat a BUN/Cr today and get a urine culture. I will try to schedule him but he remains reluctant to consider therapy.    Today's examination :   Past Medical History  Diagnosis Date  . Squamous cell carcinoma, face   . Nephrolithiasis     left   . Hyperlipidemia     LDL goal = < 100 based on NMR Lipoproprofile  . Fasting hyperglycemia   . Hypertension   . Arthritis     Past Surgical History  Procedure Date  . Lithotripsy      X2 w/o benefit  . Cataract extraction     OD 2005  . Tonsillectomy   . Colonoscopy w/ polypectomy     adenoma X2 in  2007  . Vasectomy     Family History  Problem Relation Age of Onset  . Heart attack Father 57     CBAG X5 vessels  . Heart disease Father     Heart Disease before age of  30  . Hyperlipidemia Father   . Hypertension Father   . Stroke Father   . Hypertension Brother     .  Aortic aneurysm Paternal Uncle     AAA ruptured  . Heart disease Mother   . Hyperlipidemia Mother    Social History:  reports that he has never smoked. He has never used smokeless tobacco. He reports that he drinks alcohol. He reports that he does not use illicit drugs.  Allergies:  Allergies  Allergen Reactions  . Lisinopril     Swelling of uvula   Results for Richard [name], Richard [name] (MRN [id number]) as of 02/28/2012 08:20  Ref. Range 02/28/2012 07:30  Prothrombin Time Latest Range: 11.6-15.2 seconds 13.7  INR Latest Range: 0.00-1.49  1.03  Results for Richard [name], Richard [name] (MRN [id number]) as of 02/28/2012 08:20  Ref. Range 02/28/2012 07:30  WBC Latest Range: 4.0-10.5 K/uL 9.0  RBC Latest Range: 4.22-5.81 MIL/uL 5.51  Hemoglobin Latest Range: 13.0-17.0 g/dL 78.2  HCT Latest Range: 39.0-52.0 % 44.5  MCV Latest Range: 78.0-100.0 fL 80.8  MCH Latest Range: 26.0-34.0 pg 26.9  MCHC Latest Range: 30.0-36.0 g/dL 95.6  RDW Latest Range: 11.5-15.5 % 14.6  Platelets Latest Range: 150-400 K/uL 192  Neutrophils Relative Latest Range: 43-77 % 54  Lymphocytes Relative Latest Range: 12-46 % 35  Monocytes Relative Latest Range: 3-12 % 9  Eosinophils Relative Latest Range: 0-5 % 2  Basophils Relative Latest Range: 0-1 % 0  NEUT# Latest Range: 1.7-7.7 K/uL 4.8  Lymphocytes Absolute Latest Range: 0.7-4.0 K/uL 3.1  Monocytes Absolute Latest Range: 0.1-1.0 K/uL 0.8  Eosinophils Absolute Latest Range: 0.0-0.7 K/uL 0.2  Basophils Absolute Latest Range: 0.0-0.1 K/uL 0.0    Results for orders placed during the hospital encounter of 02/28/12 (from the past 48 hour(s))  ABO/RH     Status: Normal   Collection Time   02/28/12  8:00 AM      Component Value Range Comment   ABO/RH(D) A POS       Review of Systems  Constitutional: Positive for malaise/fatigue. Negative for fever and chills.  Respiratory: Negative for cough, hemoptysis, sputum production and shortness of breath.   Cardiovascular: Negative for chest  pain, palpitations, claudication and leg swelling.  Gastrointestinal: Positive for abdominal pain.  Genitourinary: Positive for flank pain.  Skin: Positive for rash.  Neurological: Positive for weakness. Negative for dizziness, focal weakness, seizures and loss of consciousness.  Endo/Heme/Allergies: Does not bruise/bleed easily.  Psychiatric/Behavioral: Negative for depression. The patient is not nervous/anxious.     Blood pressure 142/92, pulse 64, temperature 98.7 F (37.1 C), temperature source Oral, resp. rate 16, height 6\' 1"  (1.854 m), weight 233 lb (105.688 kg), SpO2 99.00%. Physical Exam  Constitutional: He is oriented to person, place, and time. He appears well-developed and well-nourished. No distress.  HENT:  Head: Normocephalic and atraumatic.  Cardiovascular: Normal rate and normal heart sounds.  Exam reveals no gallop and no friction rub.   No murmur heard. Respiratory: Effort normal and breath sounds normal. No respiratory distress. He has no wheezes. He has no rales.  GI: Soft. Bowel sounds are normal.  Musculoskeletal: Normal range of motion. He exhibits no edema.  Neurological: He is alert and oriented to person, place, and time.  Psychiatric: He has a normal mood and affect. His behavior is normal. Judgment and thought content normal.     Assessment/Plan Discussed with patient in detail procedure for percutaneous nephrostomy drain placement. Reasons for same, benefits, and potential complications discussed including but not limited to infection, bleeding, organ damage, unsuccessful placement, and complications with moderate sedation discussed with his apparent understanding. Written consent obtained.   CAMPBELL,PAMELA  D 02/28/2012, 8:32 AM

## 2012-02-28 NOTE — Anesthesia Preprocedure Evaluation (Addendum)
Anesthesia Evaluation  Patient identified by MRN, date of birth, ID band Patient awake    Reviewed: Allergy & Precautions, H&P , NPO status , Patient's Chart, lab work & pertinent test results  Airway Mallampati: II TM Distance: >3 FB Neck ROM: Full    Dental No notable dental hx.    Pulmonary neg pulmonary ROS,  breath sounds clear to auscultation  Pulmonary exam normal       Cardiovascular Exercise Tolerance: Good hypertension, Pt. on medications and Pt. on home beta blockers + Peripheral Vascular Disease Rhythm:Regular Rate:Normal  Aneurysm left internal iliac artery, asymptomatic.   Neuro/Psych negative neurological ROS  negative psych ROS   GI/Hepatic negative GI ROS, Neg liver ROS,   Endo/Other  negative endocrine ROS  Renal/GU Renal InsufficiencyRenal diseaseCr 1.59  negative genitourinary   Musculoskeletal negative musculoskeletal ROS (+)   Abdominal (+) + obese,   Peds negative pediatric ROS (+)  Hematology negative hematology ROS (+)   Anesthesia Other Findings   Reproductive/Obstetrics negative OB ROS                         Anesthesia Physical Anesthesia Plan  ASA: III  Anesthesia Plan: General   Post-op Pain Management:    Induction: Intravenous  Airway Management Planned: Oral ETT  Additional Equipment:   Intra-op Plan:   Post-operative Plan: Extubation in OR  Informed Consent: I have reviewed the patients History and Physical, chart, labs and discussed the procedure including the risks, benefits and alternatives for the proposed anesthesia with the patient or authorized representative who has indicated his/her understanding and acceptance.   Dental advisory given  Plan Discussed with: CRNA  Anesthesia Plan Comments:         Anesthesia Quick Evaluation

## 2012-02-28 NOTE — Anesthesia Postprocedure Evaluation (Signed)
  Anesthesia Post-op Note  Patient: Richard [name]  Procedure(s) Performed: Procedure(s) (LRB): NEPHROLITHOTOMY PERCUTANEOUS (Left) HOLMIUM LASER APPLICATION (Left) CYSTOSCOPY WITH URETEROSCOPY (Left)  Patient Location: PACU  Anesthesia Type: General  Level of Consciousness: awake and alert   Airway and Oxygen Therapy: Patient Spontanous Breathing  Post-op Pain: mild  Post-op Assessment: Post-op Vital signs reviewed, Patient's Cardiovascular Status Stable, Respiratory Function Stable, Patent Airway and No signs of Nausea or vomiting  Post-op Vital Signs: stable  Complications: No apparent anesthesia complications

## 2012-02-28 NOTE — Preoperative (Signed)
Beta Blockers   Reason not to administer Beta Blockers:Took Lopressor 25 mg po this am.

## 2012-02-29 ENCOUNTER — Inpatient Hospital Stay (HOSPITAL_COMMUNITY): Payer: 59

## 2012-02-29 ENCOUNTER — Encounter (HOSPITAL_COMMUNITY): Payer: Self-pay | Admitting: Urology

## 2012-02-29 LAB — HEMOGLOBIN AND HEMATOCRIT, BLOOD
HCT: 37.1 % — ABNORMAL LOW (ref 39.0–52.0)
Hemoglobin: 11.9 g/dL — ABNORMAL LOW (ref 13.0–17.0)

## 2012-02-29 LAB — BASIC METABOLIC PANEL
CO2: 28 mEq/L (ref 19–32)
Chloride: 102 mEq/L (ref 96–112)
Glucose, Bld: 110 mg/dL — ABNORMAL HIGH (ref 70–99)
Potassium: 4 mEq/L (ref 3.5–5.1)
Sodium: 136 mEq/L (ref 135–145)

## 2012-02-29 MED ORDER — PHENAZOPYRIDINE HCL 200 MG PO TABS: 200.0000 mg | ORAL_TABLET | Freq: Three times a day (TID) | ORAL | Status: AC | PRN

## 2012-02-29 MED ORDER — HYOSCYAMINE SULFATE 0.125 MG SL SUBL: 0.1250 mg | SUBLINGUAL_TABLET | SUBLINGUAL | Status: DC | PRN

## 2012-02-29 MED ORDER — HYDROMORPHONE HCL 4 MG PO TABS: 4.0000 mg | ORAL_TABLET | ORAL | Status: DC | PRN

## 2012-02-29 NOTE — Op Note (Signed)
Richard [name], Richard [name]               ACCOUNT NO.:  [id number]  MEDICAL RECORD NO.:  [id number]  LOCATION:  1440                         FACILITY:  [facility]  PHYSICIAN:  Excell Seltzer. Annabell Howells, M.D.    DATE OF BIRTH:  06-09-1946  DATE OF PROCEDURE:  02/28/2012 DATE OF DISCHARGE:                              OPERATIVE REPORT   PROCEDURE: 1. Cystoscopy with left ureteroscopic stone extraction with holmium     lasertripsy. 2. Left percutaneous nephrolithotomy for total stone dimension greater     than 2 cm with antegrade ureteroscopy with holmium lasertripsy.  PREOPERATIVE DIAGNOSIS:  Left renal, proximal ureteral and distal ureteral calculi.  POSTOPERATIVE DIAGNOSIS:  Left renal, proximal ureteral and distal ureteral calculi.  SURGEON:  Excell Seltzer. Annabell Howells, MD  ANESTHESIA:  General.  SPECIMEN:  Stone fragments.  DRAINS:  A 24-French Ainsworth left nephrostomy tube, a 5-French Kumpe safety catheter, and a 20-French Foley catheter.  BLOOD LOSS:  Approximately 25 mL.  COMPLICATIONS:  None.  INDICATIONS:  Mr. Harman is a 66 year old white male with a 10-11 mm stones in the left kidney,left proximal ureter and hand left distal ureter.  He has a 2.9 cm iliac artery aneurysm, and it was felt that both antegrade and retrograde approach dimension, these stones were indicated.  He preferred a single sitting.  FINDINGS AND PROCEDURE:  He was given Cipro.  He has taken to the operating room, where general anesthetic was induced.  He was fitted with PAS hose and placed in lithotomy position.  His perineum and genitalia were prepped with Betadine solution and draped in usual sterile fashion.  Cystoscopy was performed using the 22-French scope and 12 degree lens. Examination revealed a normal urethra.  The external sphincter was intact.  Prostatic urethra was approximately 2-3 cm in length with trilobar hyperplasia with some obstruction.  Examination of the bladder revealed mild trabeculation.  The  nephrostomy catheter was noted exiting the left ureteral orifice.  The right ureteral orifice was unremarkable. No tumors or stones were noted.  Because the nephrostomy catheter was placed across the ureteral orifice, I grasped and pulled it back to the urethral meatus and then inserted a guidewire through the nephrostomy catheter, and then we released the nephrostomy catheter from the left flank and pulled that back, so it was in the mid ureter.  At this point, the 6.5-French short ureteroscope was passed alongside the wire and the stone was visualized.  It was engaged with a 365 micron laser fiber at 0.5 watts and 20 Hz initially, but then it was reduced at 10 Hz but the power was increased to 0.8 watts.  The stone was fragmented into manageable fragments, which were removed with a Nitinol basket to the bladder.  Once the stone had been completely fragmented and the fragments removed from the ureter, the cystoscope was reinserted and the stone fragments were aspirated from the bladder.  The cystoscope was then reinserted over the wire and the nephrostomy catheter was readvanced back into the bladder and the wire was removed from within the nephrostomy catheter.  At the end of cystoscopy. A 20-French Foley catheter was inserted and placed to straight drainage.  He was then taken  down from lithotomy position and moved to the holding room stretcher.  The table was then reconfigured and he was then rolled prone on chest rolls with a great care being taken to pad all pressure points.  Once in position, his nephrostomy site was prepped with Betadine solution and he was draped in usual sterile fashion.  A guidewire was placed to the bladder under fluoroscopic guidance, and the nephrostomy catheter was removed.  The skin incision was enlarged sufficient for the nephrostomy tract.  A 10-French peel-away sheath was placed over the guidewire into the proximal ureter.  The inner core  was removed and a 2nd wire was then placed, it would only go to the level of the stone in the lower portion of the proximal ureter, but this was felt to be sufficient.  At this point, the peel-away sheath was removed and a trach master balloon was placed over the 2nd wire.  The balloon was then inflated to 18 atmospheres.  Fluoroscopy revealed no waste.  The nephrostomy sheath was then inserted into the renal pelvis.  The balloon was deflated and removed.  At this point, a rigid nephroscope was passed.  Several clots were removed.  The stone that was in the renal pelvis was identified and was removed intact.  I then attempted to pass a 16-French flexible nephroscope down the ureter, but that was not sufficiently dilated.  I then passed the digital ureteroscope and was able to advance it to the stone in the lower portion of the proximal ureter.  This stone was then engaged with a 200 micron laser fiber at 0.5 watts and 20 Hz and broken into manageable fragments.  These fragments were then removed using the Nitinol basket.  Once all greater than 1 mm fragments were removed, the ureteroscope was removed and the rigid nephroscope was replaced into the renal pelvis where a couple of 2-3 mm fragments were identified and were removed. Some dust was swept from the mucosa.  At this point, there was no significant residual stone material noted.  A 24-French Ainsworth catheter was then passed through the nephrostomy sheath into the renal pelvis.  Contrast was instilled, which revealed an intact the renal pelvis with no extravasation.  The sheath was backed out and the balloon was inflated with 3 mL of fluid to maintain retention and then the nephrostomy tract was backed out farther.  The safety wire was then replaced with a 5-French Kumpe catheter, which was placed over the wire into the bladder to ensure complete ureteral transit.  At this point, the guidewire was removed and the Kumpe  catheter was capped.  The nephrostomy and Kumpe catheters were both secured to the skin with 2-0 silk suture, and the nephrostomy catheter was placed to straight drainage.  The sheath was cut away from the catheter.  Pressure was applied to the flank for 3 minutes.  The flank was then dressed and the nephrostomy tube was secured.  The patient was then rolled supine on the recovery room stretcher.  His anesthetic was reversed.  He was moved to recovery in stable condition.  There were no complications.     Excell Seltzer. Annabell Howells, M.D.     JJW/MEDQ  D:  02/28/2012  T:  02/29/2012  Job:  [id number]

## 2012-02-29 NOTE — Discharge Summary (Signed)
Physician Discharge Summary  Patient ID: Richard [name] MRN: [id number] DOB/AGE: 1946-04-29 66 y.o.  Admit date: 02/28/2012 Discharge date: 02/29/2012  Admission Diagnoses: left renal and ureteral stones.  Discharge Diagnoses: same Active Problems:  * No active hospital problems. *    Discharged Condition: good  Hospital Course: Richard [name] had a left ureteroscopic stone extraction for a left distal stone and a left percutaneous nephrolithotomy for a left renal and left proximal stone done yesterday without complications.  He is doing well post op with a stable Hgb overnight of 11.9 and a Cr of 1.64 which is better than his preop level.   He has a foley indwelling that will be removed today and he has a left nephrostomy tube and safety catheter that will be left in until Monday.  He was to have a KUB to assess for residual fragments this morning but that is still pending but will not delay discharge.  Consults: None  Significant Diagnostic Studies: labs: H&H and BMET and radiology: KUB: pending  Treatments: surgery: as above  Discharge Exam: Blood pressure 110/68, pulse 49, temperature 98.3 F (36.8 C), temperature source Oral, resp. rate 16, height 5\' 11"  (1.803 m), weight 105.688 kg (233 lb), SpO2 96.00%. General appearance: alert and no distress Resp: clear to auscultation bilaterally Cardio: regular rate and rhythm GI: Soft nontender.  the flank dressing is dry.  Disposition: To home  Medication List  As of 02/29/2012  7:29 AM   TAKE these medications         ADVIL 200 MG Caps   Generic drug: Ibuprofen   Take 600-800 mg by mouth every 6 (six) hours as needed. For pain      amLODipine 5 MG tablet   Commonly known as: NORVASC   Take 5 mg by mouth every morning.      aspirin 81 MG EC tablet   Take 81 mg by mouth daily.      atorvastatin 20 MG tablet   Commonly known as: LIPITOR   Take 20 mg by mouth See admin instructions. Monday Wed Friday Sunday Only     HYDROcodone-acetaminophen 5-325 MG per tablet   Commonly known as: NORCO/VICODIN   Take 1 tablet by mouth every 6 (six) hours as needed.      HYDROmorphone 4 MG tablet   Commonly known as: DILAUDID   Take 1 tablet (4 mg total) by mouth every 4 (four) hours as needed. Every 4 to 6 hours as needed For pain      hyoscyamine 0.125 MG SL tablet   Commonly known as: LEVSIN SL   Place 1 tablet (0.125 mg total) under the tongue every 4 (four) hours as needed for cramping.      metoprolol tartrate 25 MG tablet   Commonly known as: LOPRESSOR   Take 1 tablet (25 mg total) by mouth 2 (two) times daily.      MULTIVITAMIN PO   Take 1 tablet by mouth every morning.      phenazopyridine 200 MG tablet   Commonly known as: PYRIDIUM   Take 1 tablet (200 mg total) by mouth 3 (three) times daily as needed for pain.           Follow-up Information    Follow up with Anner Crete, MD on 03/03/2012. [phone])    Contact information:   [address] [place] [address] [place] [address] [phone]          Signed: Anner Crete 02/29/2012, 7:29 AM

## 2012-03-26 ENCOUNTER — Other Ambulatory Visit: Payer: Self-pay | Admitting: Internal Medicine

## 2012-05-23 ENCOUNTER — Other Ambulatory Visit: Payer: Self-pay | Admitting: *Deleted

## 2012-05-23 DIAGNOSIS — Z48812 Encounter for surgical aftercare following surgery on the circulatory system: Secondary | ICD-10-CM

## 2012-05-23 DIAGNOSIS — I723 Aneurysm of iliac artery: Secondary | ICD-10-CM

## 2012-05-23 DIAGNOSIS — I739 Peripheral vascular disease, unspecified: Secondary | ICD-10-CM

## 2012-07-04 ENCOUNTER — Ambulatory Visit (INDEPENDENT_AMBULATORY_CARE_PROVIDER_SITE_OTHER): Payer: 59 | Admitting: Internal Medicine

## 2012-07-04 ENCOUNTER — Encounter: Payer: Self-pay | Admitting: Internal Medicine

## 2012-07-04 VITALS — BP 128/92 | HR 55 | Resp 12 | Wt 226.0 lb

## 2012-07-04 DIAGNOSIS — Z0000 Encounter for general adult medical examination without abnormal findings: Secondary | ICD-10-CM

## 2012-07-04 DIAGNOSIS — N289 Disorder of kidney and ureter, unspecified: Secondary | ICD-10-CM | POA: Insufficient documentation

## 2012-07-04 DIAGNOSIS — E785 Hyperlipidemia, unspecified: Secondary | ICD-10-CM

## 2012-07-04 DIAGNOSIS — I723 Aneurysm of iliac artery: Secondary | ICD-10-CM

## 2012-07-04 DIAGNOSIS — I10 Essential (primary) hypertension: Secondary | ICD-10-CM

## 2012-07-04 DIAGNOSIS — R7309 Other abnormal glucose: Secondary | ICD-10-CM

## 2012-07-04 DIAGNOSIS — D126 Benign neoplasm of colon, unspecified: Secondary | ICD-10-CM

## 2012-07-04 LAB — HEPATIC FUNCTION PANEL
Albumin: 4.3 g/dL (ref 3.5–5.2)
Alkaline Phosphatase: 53 U/L (ref 39–117)

## 2012-07-04 LAB — BASIC METABOLIC PANEL
BUN: 24 mg/dL — ABNORMAL HIGH (ref 6–23)
CO2: 30 mEq/L (ref 19–32)
Calcium: 9.4 mg/dL (ref 8.4–10.5)
Creatinine, Ser: 1.4 mg/dL (ref 0.4–1.5)
Glucose, Bld: 105 mg/dL — ABNORMAL HIGH (ref 70–99)

## 2012-07-04 LAB — LIPID PANEL
Cholesterol: 144 mg/dL (ref 0–200)
HDL: 58.4 mg/dL (ref >39.00)
VLDL: 11.6 mg/dL (ref 0.0–40.0)

## 2012-07-04 MED ORDER — ATORVASTATIN CALCIUM 20 MG PO TABS: 20.0000 mg | ORAL_TABLET | Freq: Every day | ORAL | Status: DC

## 2012-07-04 MED ORDER — AMLODIPINE BESYLATE 5 MG PO TABS: 5.0000 mg | ORAL_TABLET | Freq: Every day | ORAL | Status: DC

## 2012-07-04 MED ORDER — METOPROLOL TARTRATE 25 MG PO TABS: 25.0000 mg | ORAL_TABLET | Freq: Two times a day (BID) | ORAL | Status: DC

## 2012-07-04 NOTE — Assessment & Plan Note (Signed)
A1c will be checked

## 2012-07-04 NOTE — Assessment & Plan Note (Signed)
F/U with Dr Hart Rochester in 07/2012

## 2012-07-04 NOTE — Assessment & Plan Note (Signed)
Optimally blood pressure goal is less than 135/85 on average because of the iliac artery aneurysm

## 2012-07-04 NOTE — Assessment & Plan Note (Signed)
Close monitor is necessary especially if repeat contrast studies are required to evaluate the common iliac artery aneurysm

## 2012-07-04 NOTE — Progress Notes (Signed)
  Subjective:    Patient ID: Richard [name], male    DOB: Jul 06, 1946, 66 y.o.   MRN: [id number]  HPI  Richard [name] is here for a physical;active issues include  Urologic surgery for renal calculi in 02/2012 & common iliac aneurysm which is being by Dr Hart Rochester      Review of Systems Last ophthalmologic examination 07/2010  revealed no issues. Diet is modified low salt . Exercise as weight lifting . There is medical compliance with the statin. Blood pressure  is not monitored @ home . There is medical compliance with antihypertensive medications  Constitutional: No fever, chills, significant weight change, fatigue, weakness or night sweats Eyes: No  blurred vision, double vision, or loss of vision Cardiovascular: no chest pain, palpitations, racing, irregular rhythm,syncope,nausea,sweating, claudication. Minor  edema  Respiratory: No exertinal dyspnea, paroxysmal nocturnal dyspnea Gastrointestinal: No heartburn,dysphagia, diarrhea, significant constipation, rectal bleeding, melena Genitourinary: No dysuria,hematuria, pyuria, frequency,  incontinence, nocturia Dermatologic: No rash, pruritus, urticaria, or change in color or temperature of skin Neurologic: No  limb weakness,  numbness or tingling Endocrine: No change in hair/skin, excessive thirst, excessive hunger, excessive urination. Fungal nail changes      Objective:   Physical Exam Gen.: Healthy and well-nourished in appearance. Alert, appropriate and cooperative throughout exam. Head: Normocephalic without obvious abnormalities; beard & moustache Eyes: No corneal or conjunctival inflammation noted. Pupils equal round reactive to light and accommodation.  Extraocular motion intact. Contact OS. Ears: External  ear exam reveals no significant lesions or deformities. Canals clear .TMs normal.  Nose: External nasal exam reveals no deformity or inflammation. Nasal mucosa are pink and moist. No lesions or exudates noted.   Mouth: Oral mucosa  and oropharynx reveal no lesions or exudates. Teeth in good repair. Neck: No deformities, masses, or tenderness noted. Range of motion & Thyroid normal. Lungs: Normal respiratory effort; chest expands symmetrically. Lungs are clear to auscultation without rales, wheezes, or increased work of breathing. Heart: Normal rate and rhythm. Normal S1 and S2. No gallop, click, or rub. S4 w/o murmur. Abdomen: Bowel sounds normal; abdomen soft and nontender. No masses, organomegaly or hernias noted. Genitalia: Dr Jerrye Beavers, Urology Musculoskeletal/extremities: No deformity or scoliosis noted of  the thoracic or lumbar spine. No clubbing, cyanosis, edema, or deformity noted. Range of motion  normal .Tone & strength  normal.Joints normal. Nail health  good. Vascular: Carotid, radial artery, dorsalis pedis and  posterior tibial pulses are full and equal. No bruits present. Neurologic: Alert and oriented x3. Deep tendon reflexes symmetrical and normal.          Skin: Intact without suspicious lesions or rashes. Lymph: No cervical, axillary lymphadenopathy present. Psych: Mood and affect are normal. Normally interactive                                                                                         Assessment & Plan:  #1 comprehensive physical exam; no acute findings #2 see Problem List with Assessments & Recommendations Plan: see Orders

## 2012-07-04 NOTE — Patient Instructions (Addendum)
Preventive Health Care: Exercise at least 30-45 minutes a day,  3-4 days a week.  Eat a low-fat diet with lots of fruits and vegetables, up to 7-9 servings per day.  Consume less than 40 grams of sugar per day from foods & drinks with High Fructose Corn Sugar as #1,2,3 or # 4 on label. As per the Standard of Care , screening Colonoscopy recommended @ 50 & every 5-10 years thereafter . More frequent monitor would be dictated by family history or findings @ Colonoscopy  Health Care Power of Attorney & Living Will. Complete if not in place ; these place you in charge of your health care decisions. Blood Pressure Goal  Ideally is an AVERAGE < 135/85. This AVERAGE should be calculated from @ least 5-7 BP readings taken @ different times of day on different days of week. You should not respond to isolated BP readings , but rather the AVERAGE for that week .  If you activate My Chart; the results can be released to you as soon as they populate from the lab. If you choose not to use this program; the labs have to be reviewed, copied & mailed   causing a delay in getting the results to you.

## 2012-07-04 NOTE — Assessment & Plan Note (Signed)
Followup of the adenomatous colon polyps should be pursued. This should be coordinated with Dr. Hart Rochester who is monitoring the common iliac artery aneurysm.

## 2012-08-26 ENCOUNTER — Ambulatory Visit: Payer: 59 | Admitting: Vascular Surgery

## 2012-08-26 ENCOUNTER — Other Ambulatory Visit: Payer: 59

## 2012-09-18 ENCOUNTER — Other Ambulatory Visit: Payer: Self-pay | Admitting: Vascular Surgery

## 2012-09-22 ENCOUNTER — Encounter: Payer: Self-pay | Admitting: Vascular Surgery

## 2012-09-23 ENCOUNTER — Ambulatory Visit (INDEPENDENT_AMBULATORY_CARE_PROVIDER_SITE_OTHER): Payer: 59 | Admitting: Vascular Surgery

## 2012-09-23 ENCOUNTER — Ambulatory Visit (HOSPITAL_COMMUNITY)
Admission: RE | Admit: 2012-09-23 | Discharge: 2012-09-23 | Disposition: A | Discharge status: Home / Self Care | Payer: 59 | Source: Ambulatory Visit | Attending: Vascular Surgery | Admitting: Vascular Surgery

## 2012-09-23 ENCOUNTER — Encounter: Payer: Self-pay | Admitting: Vascular Surgery

## 2012-09-23 ENCOUNTER — Other Ambulatory Visit: Payer: Self-pay | Admitting: *Deleted

## 2012-09-23 VITALS — BP 142/88 | HR 57 | Ht 73.0 in | Wt 232.0 lb

## 2012-09-23 DIAGNOSIS — I723 Aneurysm of iliac artery: Secondary | ICD-10-CM

## 2012-09-23 DIAGNOSIS — R1032 Left lower quadrant pain: Secondary | ICD-10-CM | POA: Insufficient documentation

## 2012-09-23 DIAGNOSIS — I739 Peripheral vascular disease, unspecified: Secondary | ICD-10-CM

## 2012-09-23 DIAGNOSIS — Z48812 Encounter for surgical aftercare following surgery on the circulatory system: Secondary | ICD-10-CM

## 2012-09-23 MED ORDER — IOHEXOL 350 MG/ML SOLN
100.0000 mL | Freq: Once | INTRAVENOUS | Status: AC | PRN
  Administered 2012-09-23: 100 mL via INTRAVENOUS

## 2012-09-23 MED ORDER — IOHEXOL 350 MG/ML SOLN: 100.0000 mL | Freq: Once | INTRAVENOUS | Status: AC | PRN

## 2012-09-23 NOTE — Progress Notes (Signed)
Subjective:     Patient ID: Richard [name], male   DOB: May 28, 1946, 67 y.o.   MRN: [id number]  HPIthis 67 year old male returns for continued followup regarding his left iliac artery aneurysm which I saw him for one year ago. At that time it was 28 mm in maximum diameter. Since then he has had treatment for left kidney stones by Dr. Bjorn Pippin . He currently denies any specific abdominal or back symptoms but has had some left inguinal discomfort recently. He also has had no recurrent kidney .  Past Medical History  Diagnosis Date  . Squamous cell carcinoma, face     Dr Irene Limbo  . Nephrolithiasis     left   . Hyperlipidemia     LDL goal = < 100 based on NMR Lipoproprofile  . Fasting hyperglycemia   . Hypertension   . Arthritis     History  Substance Use Topics  . Smoking status: Never Smoker   . Smokeless tobacco: Never Used  . Alcohol Use: No     Comment:  rarely    Family History  Problem Relation Age of Onset  . Heart attack Father 57     CBAG X5 vessels  . Hyperlipidemia Father   . Hypertension Father   . Hypertension Brother   . Aortic aneurysm Paternal Uncle     AAA ruptured  . Heart disease Mother   . Hyperlipidemia Mother   . Diabetes Neg Hx     Allergies  Allergen Reactions  . Angiotensin Receptor Blockers     Angioedema with ACE-I  . Ciprofloxacin     Rash on combination of Cipro & Pyridium after cystoscopic & open resection of renal calculi  . Lisinopril     Swelling of uvula  . Pyridium (Phenazopyridine)     In combo with Cipro    Current outpatient prescriptions:amLODipine (NORVASC) 5 MG tablet, Take 1 tablet (5 mg total) by mouth daily., Disp: 90 tablet, Rfl: 3;  aspirin 81 MG EC tablet, Take 81 mg by mouth daily. , Disp: , Rfl: ;  atorvastatin (LIPITOR) 20 MG tablet, Take 1 tablet (20 mg total) by mouth daily. Monday Wed Friday Sunday Only, Disp: 90 tablet, Rfl: 3 Ibuprofen (ADVIL) 200 MG CAPS, Take 600-800 mg by mouth every 6 (six) hours as needed.  For pain, Disp: , Rfl: ;  metoprolol tartrate (LOPRESSOR) 25 MG tablet, Take 1 tablet (25 mg total) by mouth 2 (two) times daily., Disp: 180 tablet, Rfl: 3;  Multiple Vitamin (MULTIVITAMIN PO), Take 1 tablet by mouth every morning. , Disp: , Rfl:  No current facility-administered medications for this visit. Facility-Administered Medications Ordered in Other Visits: iohexol (OMNIPAQUE) 350 MG/ML injection 100 mL, 100 mL, Intravenous, Once PRN, Medication Radiologist, MD  BP 142/88  Pulse 57  Ht 6\' 1"  (1.854 m)  Wt 232 lb (105.235 kg)  BMI 30.62 kg/m2  SpO2 100%  Body mass index is 30.62 kg/(m^2).           Review of SystemsDenies chest pain, dyspnea on exertion, PND, orthopnea, hemoptysis, claudication     Objective:   Physical Exam blood pressure 142/88 heart rate 87 respirations 18 Gen.-alert and oriented x3 in no apparent distress HEENT normal for age Lungs no rhonchi or wheezing Cardiovascular regular rhythm no murmurs carotid pulses 3+ palpable no bruits audible Abdomen soft nontender no palpable masses Musculoskeletal free of  major deformities Skin clear -no rashes Neurologic normal Lower extremities 3+ femoral and dorsalis pedis pulses palpable bilaterally  with no edema  Today I ordered a CT angiogram which I reviewed a computer. There is some mild dilatation of theascending aortic arch-4.2 cm The left common iliac artery aneurysm measures between 29 and 30 mm-a very slight increase since one year ago.      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 If any further enlargement of left iliac artery aneurysm he will need stent graft treatment including embolization of left internal iliac artery. We'll also look again at size of the ascending aorta

## 2013-03-23 ENCOUNTER — Encounter (HOSPITAL_COMMUNITY): Payer: Self-pay | Admitting: Anesthesiology

## 2013-03-23 ENCOUNTER — Inpatient Hospital Stay (HOSPITAL_COMMUNITY): Payer: Medicare Other

## 2013-03-23 ENCOUNTER — Inpatient Hospital Stay (HOSPITAL_COMMUNITY): Payer: Medicare Other | Admitting: Anesthesiology

## 2013-03-23 ENCOUNTER — Other Ambulatory Visit: Payer: Self-pay | Admitting: Urology

## 2013-03-23 ENCOUNTER — Observation Stay (HOSPITAL_COMMUNITY)
Admission: AD | Admit: 2013-03-23 | Discharge: 2013-03-25 | Disposition: A | Discharge status: Home / Self Care | Payer: Medicare Other | Source: Ambulatory Visit | Attending: Urology | Admitting: Urology

## 2013-03-23 ENCOUNTER — Encounter (HOSPITAL_COMMUNITY)
Admission: AD | Disposition: A | Discharge status: Home / Self Care | Payer: Self-pay | Source: Ambulatory Visit | Attending: Urology

## 2013-03-23 ENCOUNTER — Encounter (HOSPITAL_COMMUNITY): Payer: Self-pay | Admitting: *Deleted

## 2013-03-23 DIAGNOSIS — N289 Disorder of kidney and ureter, unspecified: Secondary | ICD-10-CM | POA: Insufficient documentation

## 2013-03-23 DIAGNOSIS — N201 Calculus of ureter: Principal | ICD-10-CM | POA: Insufficient documentation

## 2013-03-23 DIAGNOSIS — I723 Aneurysm of iliac artery: Secondary | ICD-10-CM | POA: Insufficient documentation

## 2013-03-23 DIAGNOSIS — N132 Hydronephrosis with renal and ureteral calculous obstruction: Secondary | ICD-10-CM | POA: Diagnosis present

## 2013-03-23 DIAGNOSIS — N1330 Unspecified hydronephrosis: Secondary | ICD-10-CM | POA: Insufficient documentation

## 2013-03-23 DIAGNOSIS — Z79899 Other long term (current) drug therapy: Secondary | ICD-10-CM | POA: Insufficient documentation

## 2013-03-23 DIAGNOSIS — Z7982 Long term (current) use of aspirin: Secondary | ICD-10-CM | POA: Insufficient documentation

## 2013-03-23 DIAGNOSIS — A419 Sepsis, unspecified organism: Secondary | ICD-10-CM | POA: Diagnosis present

## 2013-03-23 DIAGNOSIS — E7800 Pure hypercholesterolemia, unspecified: Secondary | ICD-10-CM | POA: Insufficient documentation

## 2013-03-23 DIAGNOSIS — D72829 Elevated white blood cell count, unspecified: Secondary | ICD-10-CM | POA: Insufficient documentation

## 2013-03-23 DIAGNOSIS — N179 Acute kidney failure, unspecified: Secondary | ICD-10-CM | POA: Insufficient documentation

## 2013-03-23 DIAGNOSIS — N200 Calculus of kidney: Secondary | ICD-10-CM | POA: Insufficient documentation

## 2013-03-23 DIAGNOSIS — E669 Obesity, unspecified: Secondary | ICD-10-CM | POA: Insufficient documentation

## 2013-03-23 DIAGNOSIS — I10 Essential (primary) hypertension: Secondary | ICD-10-CM | POA: Insufficient documentation

## 2013-03-23 HISTORY — DX: Hydronephrosis with renal and ureteral calculous obstruction: N13.2

## 2013-03-23 HISTORY — DX: Sepsis, unspecified organism: A41.9

## 2013-03-23 HISTORY — DX: Disorder of kidney and ureter, unspecified: N28.9

## 2013-03-23 HISTORY — DX: Aneurysm of unspecified site: I72.9

## 2013-03-23 HISTORY — PX: CYSTOSCOPY W/ URETERAL STENT PLACEMENT: SHX1429

## 2013-03-23 LAB — BASIC METABOLIC PANEL
BUN: 18 mg/dL (ref 6–23)
Calcium: 9.5 mg/dL (ref 8.4–10.5)
GFR calc non Af Amer: 28 mL/min — ABNORMAL LOW (ref >90)
Glucose, Bld: 115 mg/dL — ABNORMAL HIGH (ref 70–99)

## 2013-03-23 LAB — CBC
HCT: 47.4 % (ref 39.0–52.0)
Hemoglobin: 15.4 g/dL (ref 13.0–17.0)
MCH: 26.9 pg (ref 26.0–34.0)
MCHC: 32.5 g/dL (ref 30.0–36.0)

## 2013-03-23 SURGERY — CYSTOSCOPY, WITH RETROGRADE PYELOGRAM AND URETERAL STENT INSERTION
Anesthesia: General | Site: Ureter | Laterality: Left | Wound class: Clean Contaminated

## 2013-03-23 MED ORDER — HYOSCYAMINE SULFATE 0.125 MG SL SUBL
0.1250 mg | SUBLINGUAL_TABLET | SUBLINGUAL | Status: DC | PRN
  Filled 2013-03-23: qty 1

## 2013-03-23 MED ORDER — PROMETHAZINE HCL 25 MG/ML IJ SOLN: 6.2500 mg | INTRAMUSCULAR | Status: DC | PRN

## 2013-03-23 MED ORDER — 0.9 % SODIUM CHLORIDE (POUR BTL) OPTIME
TOPICAL | Status: DC | PRN
  Administered 2013-03-23: 1000 mL

## 2013-03-23 MED ORDER — ONDANSETRON HCL 4 MG/2ML IJ SOLN
INTRAMUSCULAR | Status: DC | PRN
  Administered 2013-03-23: 4 mg via INTRAVENOUS

## 2013-03-23 MED ORDER — SODIUM CHLORIDE 0.9 % IV SOLN
INTRAVENOUS | Status: DC
  Administered 2013-03-23 – 2013-03-25 (×3): via INTRAVENOUS

## 2013-03-23 MED ORDER — DOCUSATE SODIUM 100 MG PO CAPS
100.0000 mg | ORAL_CAPSULE | Freq: Two times a day (BID) | ORAL | Status: DC
  Administered 2013-03-23 – 2013-03-24 (×3): 100 mg via ORAL
  Filled 2013-03-23 (×5): qty 1

## 2013-03-23 MED ORDER — ZOLPIDEM TARTRATE 5 MG PO TABS: 5.0000 mg | ORAL_TABLET | Freq: Every evening | ORAL | Status: DC | PRN

## 2013-03-23 MED ORDER — SODIUM CHLORIDE 0.9 % IR SOLN
Status: DC | PRN
  Administered 2013-03-23: 6000 mL

## 2013-03-23 MED ORDER — ONDANSETRON HCL 4 MG/2ML IJ SOLN: 4.0000 mg | INTRAMUSCULAR | Status: DC | PRN

## 2013-03-23 MED ORDER — HYDROMORPHONE HCL PF 1 MG/ML IJ SOLN: 0.5000 mg | INTRAMUSCULAR | Status: DC | PRN

## 2013-03-23 MED ORDER — ASPIRIN 81 MG PO TBEC
81.0000 mg | DELAYED_RELEASE_TABLET | Freq: Every day | ORAL | Status: DC
  Administered 2013-03-23 – 2013-03-24 (×2): 81 mg via ORAL
  Filled 2013-03-23 (×3): qty 1

## 2013-03-23 MED ORDER — OXYCODONE HCL 5 MG/5ML PO SOLN
5.0000 mg | Freq: Once | ORAL | Status: DC | PRN
  Filled 2013-03-23: qty 5

## 2013-03-23 MED ORDER — DIPHENHYDRAMINE HCL 50 MG/ML IJ SOLN: 12.5000 mg | Freq: Four times a day (QID) | INTRAMUSCULAR | Status: DC | PRN

## 2013-03-23 MED ORDER — DIPHENHYDRAMINE HCL 12.5 MG/5ML PO ELIX: 12.5000 mg | ORAL_SOLUTION | Freq: Four times a day (QID) | ORAL | Status: DC | PRN

## 2013-03-23 MED ORDER — SODIUM CHLORIDE 0.9 % IV SOLN
1.5000 g | INTRAVENOUS | Status: AC
  Administered 2013-03-23: 1.5 g via INTRAVENOUS
  Filled 2013-03-23: qty 1.5

## 2013-03-23 MED ORDER — LIDOCAINE HCL (CARDIAC) 20 MG/ML IV SOLN
INTRAVENOUS | Status: DC | PRN
  Administered 2013-03-23: 100 mg via INTRAVENOUS

## 2013-03-23 MED ORDER — LACTATED RINGERS IV SOLN: INTRAVENOUS | Status: DC

## 2013-03-23 MED ORDER — AMLODIPINE BESYLATE 5 MG PO TABS
5.0000 mg | ORAL_TABLET | Freq: Every day | ORAL | Status: DC
  Administered 2013-03-24: 5 mg via ORAL
  Filled 2013-03-23 (×2): qty 1

## 2013-03-23 MED ORDER — FENTANYL CITRATE 0.05 MG/ML IJ SOLN
INTRAMUSCULAR | Status: DC | PRN
  Administered 2013-03-23 (×2): 50 ug via INTRAVENOUS

## 2013-03-23 MED ORDER — IOHEXOL 300 MG/ML  SOLN
INTRAMUSCULAR | Status: DC | PRN
  Administered 2013-03-23: 17 mL via URETHRAL

## 2013-03-23 MED ORDER — ATORVASTATIN CALCIUM 20 MG PO TABS
20.0000 mg | ORAL_TABLET | ORAL | Status: DC
  Filled 2013-03-23: qty 1

## 2013-03-23 MED ORDER — PROPOFOL 10 MG/ML IV BOLUS
INTRAVENOUS | Status: DC | PRN
  Administered 2013-03-23: 200 mg via INTRAVENOUS

## 2013-03-23 MED ORDER — MIDAZOLAM HCL 5 MG/5ML IJ SOLN
INTRAMUSCULAR | Status: DC | PRN
  Administered 2013-03-23: 2 mg via INTRAVENOUS

## 2013-03-23 MED ORDER — OXYCODONE HCL 5 MG PO TABS: 5.0000 mg | ORAL_TABLET | Freq: Once | ORAL | Status: DC | PRN

## 2013-03-23 MED ORDER — ACETAMINOPHEN 325 MG PO TABS
650.0000 mg | ORAL_TABLET | ORAL | Status: DC | PRN
  Administered 2013-03-23 – 2013-03-24 (×4): 650 mg via ORAL
  Filled 2013-03-23 (×3): qty 2

## 2013-03-23 MED ORDER — DEXTROSE 5 % IV SOLN
1.0000 g | INTRAVENOUS | Status: DC
  Administered 2013-03-24 – 2013-03-25 (×2): 1 g via INTRAVENOUS
  Filled 2013-03-23 (×2): qty 10

## 2013-03-23 MED ORDER — HYDROMORPHONE HCL PF 1 MG/ML IJ SOLN: 0.2500 mg | INTRAMUSCULAR | Status: DC | PRN

## 2013-03-23 MED ORDER — HYDROCODONE-ACETAMINOPHEN 5-325 MG PO TABS
1.0000 | ORAL_TABLET | ORAL | Status: DC | PRN
  Administered 2013-03-24: 2 via ORAL
  Filled 2013-03-23: qty 2

## 2013-03-23 MED ORDER — LACTATED RINGERS IV SOLN
INTRAVENOUS | Status: DC | PRN
  Administered 2013-03-23: 18:00:00 via INTRAVENOUS

## 2013-03-23 MED ORDER — METOPROLOL TARTRATE 25 MG PO TABS
25.0000 mg | ORAL_TABLET | Freq: Two times a day (BID) | ORAL | Status: DC
  Administered 2013-03-23 – 2013-03-24 (×3): 25 mg via ORAL
  Filled 2013-03-23 (×5): qty 1

## 2013-03-23 MED ORDER — MEPERIDINE HCL 50 MG/ML IJ SOLN: 6.2500 mg | INTRAMUSCULAR | Status: DC | PRN

## 2013-03-23 SURGICAL SUPPLY — 15 items
BAG URO CATCHER STRL LF (DRAPE) ×3 IMPLANT
BASKET ZERO TIP NITINOL 2.4FR (BASKET) IMPLANT
BSKT STON RTRVL ZERO TP 2.4FR (BASKET)
CATH INTERMIT  6FR 70CM (CATHETERS) ×2 IMPLANT
CLOTH BEACON ORANGE TIMEOUT ST (SAFETY) ×3 IMPLANT
DRAPE CAMERA CLOSED 9X96 (DRAPES) ×3 IMPLANT
GLOVE BIOGEL M STRL SZ7.5 (GLOVE) ×3 IMPLANT
GOWN STRL NON-REIN LRG LVL3 (GOWN DISPOSABLE) ×5 IMPLANT
GOWN STRL REIN XL XLG (GOWN DISPOSABLE) ×3 IMPLANT
GUIDEWIRE ANG ZIPWIRE 038X150 (WIRE) IMPLANT
GUIDEWIRE STR DUAL SENSOR (WIRE) ×3 IMPLANT
MANIFOLD NEPTUNE II (INSTRUMENTS) ×3 IMPLANT
PACK CYSTO (CUSTOM PROCEDURE TRAY) ×3 IMPLANT
STENT CONTOUR 6FRX26X.038 (STENTS) ×2 IMPLANT
TUBING CONNECTING 10 (TUBING) ×3 IMPLANT

## 2013-03-23 NOTE — Transfer of Care (Signed)
Immediate Anesthesia Transfer of Care Note  Patient: Richard [name]  Procedure(s) Performed: Procedure(s): CYSTOSCOPY WITH RETROGRADE PYELOGRAM/URETERAL STENT PLACEMENT (Left)  Patient Location: PACU  Anesthesia Type:General  Level of Consciousness: awake, alert  and oriented  Airway & Oxygen Therapy: Patient Spontanous Breathing and Patient connected to face mask oxygen  Post-op Assessment: Report given to PACU RN and Post -op Vital signs reviewed and stable  Post vital signs: Reviewed and stable  Complications: No apparent anesthesia complications

## 2013-03-23 NOTE — Anesthesia Postprocedure Evaluation (Signed)
Anesthesia Post Note  Patient: Richard [name]  Procedure(s) Performed: Procedure(s) (LRB): CYSTOSCOPY WITH RETROGRADE PYELOGRAM/URETERAL STENT PLACEMENT (Left)  Anesthesia type: General  Patient location: PACU  Post pain: Pain level controlled  Post assessment: Post-op Vital signs reviewed  Last Vitals: BP 132/74  Pulse 91  Temp(Src) 37.1 C (Oral)  Resp 13  Ht 6\' 1"  (1.854 m)  Wt 225 lb (102.059 kg)  BMI 29.69 kg/m2  SpO2 95%  Post vital signs: Reviewed  Level of consciousness: sedated  Complications: No apparent anesthesia complications

## 2013-03-23 NOTE — Anesthesia Preprocedure Evaluation (Signed)
Anesthesia Evaluation  Patient identified by MRN, date of birth, ID band Patient awake    Reviewed: Allergy & Precautions, H&P , NPO status , Patient's Chart, lab work & pertinent test results  Airway Mallampati: II TM Distance: >3 FB Neck ROM: Full    Dental no notable dental hx. (+) Dental Advisory Given   Pulmonary neg pulmonary ROS,  breath sounds clear to auscultation  Pulmonary exam normal       Cardiovascular Exercise Tolerance: Good hypertension, Pt. on medications and Pt. on home beta blockers + Peripheral Vascular Disease Rhythm:Regular Rate:Normal  Aneurysm left internal iliac artery, asymptomatic.   Neuro/Psych negative neurological ROS  negative psych ROS   GI/Hepatic negative GI ROS, Neg liver ROS,   Endo/Other  negative endocrine ROS  Renal/GU Renal InsufficiencyRenal diseaseCr 1.59     Musculoskeletal negative musculoskeletal ROS (+)   Abdominal (+) + obese,   Peds  Hematology negative hematology ROS (+)   Anesthesia Other Findings   Reproductive/Obstetrics negative OB ROS                           Anesthesia Physical  Anesthesia Plan  ASA: III  Anesthesia Plan: General   Post-op Pain Management:    Induction: Intravenous  Airway Management Planned: LMA  Additional Equipment:   Intra-op Plan:   Post-operative Plan: Extubation in OR  Informed Consent: I have reviewed the patients History and Physical, chart, labs and discussed the procedure including the risks, benefits and alternatives for the proposed anesthesia with the patient or authorized representative who has indicated his/her understanding and acceptance.   Dental advisory given  Plan Discussed with: CRNA  Anesthesia Plan Comments:         Anesthesia Quick Evaluation

## 2013-03-23 NOTE — H&P (Signed)
Reason For Visit  left flank and left lower ab pain   History of Present Illness      Mr. Popper is a  patient of Dr. Belva Crome w/ GU hx of nephrolithiasis. He had 2 failed left lithotripsies about 7 years ago by Dr. Aldean Ast.   In Aug 2013 he underwent a ureteroscopy lithotripsy of a left distal stone and proximal stone and left PCNL for 1.2cm left renal stone but did not return for f/u KUB to assess for residual stones because his wife was dx w/ breast ca around that time and had to undergo bilateral mastectomy.   He was also found to have a left iliac artery aneurysm that was 2.9cm and had recent CT in Jan for f/u w/ stable finding.    Interval hx:  Mr. Mcphearson returns today w/ c/o 4-day hx of right lower left abdomen, left groin pain, radiating to left flank, alternating from left flank to lower left abdomen.  He had been experiencing some intermittent left lower abdomen/groin pain for the past year, noticeable, but not bothersome.  However, while trying to fall asleep on Friday night, 4 days ago, he experienced an acute onset of left flank pain that radiated to left lower abdomen .  Pain associated w/ some nausea, loss of appetite, and low grade fever of 100.1 F.  Pain improved with taking Norco alternating with Dilaudid, PO.  Currently, pain is tolerable at 2/10 with last pain med taken 6 hours ago. He also drank 16 oz of water today with last water intake at noon but did not seem to have much output.   Denies any urinary voiding complaints.  Denies any other alleviating/aggravating factors.   Past Medical History Problems  1. History of  Abdominal Pain In The Left Lower Belly (LLQ) 789.04 2. History of  Acute Cystitis 595.0 3. History of  Aneurysm Of Left Internal Iliac Artery 442.2 4. History of  Blood In Urine 599.7 5. History of  Diffuse Abdominal Pain 789.07 6. History of  Fever (Symptom) 780.60 7. History of  Hypercholesterolemia 272.0 8. History of  Hypertension 401.9 9. History of   Hypertension 401.9 10. History of  Nephrolithiasis V13.01 11. History of  Nephrolithiasis Of The Left Kidney V13.01 12. History of  Ureteral Stone 592.1  Surgical History Problems  1. History of  Cystoscopy With Ureteroscopy With Lithotripsy 2. History of  Lithotripsy 3. History of  Percutaneous Lithotomy For Stone Over 2cm.  Current Meds 1. Advil TABS; Therapy: (Recorded:26Dec2012) to 2. AmLODIPine Besylate 5 MG Oral Tablet; Therapy: (Recorded:26Dec2012) to 3. Aspirin Low Dose 81 MG Oral Tablet; Therapy: (Recorded:02Jan2009) to 4. Atorvastatin Calcium 20 MG Oral Tablet; Therapy: (Recorded:26Dec2012) to 5. Metoprolol Tartrate 25 MG Oral Tablet; Therapy: (Recorded:26Dec2012) to 6. Multi-Vitamin TABS; Therapy: (Recorded:02Jan2009) to  Allergies Medication  1. Cipro TABS 2. Phenazopyridine HCl TABS 3. Lisinopril TABS  Family History Problems  1. Paternal uncle's history of  Cardiac Failure 2. Paternal uncle's history of  Cardiac Failure 3. Family history of  Death In The Family Father 35- Heart failure 4. Family history of  Family Health Status - Mother's Age 79 5. Family history of  Family Health Status Number Of Children 1 son (98) 1 daughter (19)  Social History Problems  1. Alcohol Use 1 per month 2. Caffeine Use 1per day 3. Marital History - Currently Married 4. Never A Smoker 5. 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 History of  Tobacco Use V15.82  Review of Systems Genitourinary system(s)  were reviewed and pertinent findings if present are noted.  Genitourinary: cloudy urine, oliguria and inguinal pain, but no urinary frequency, no feelings of urinary urgency, urinary stream does not start and stop, no hematuria, no suprapubic pain and initiating urination does not require straining.    Vitals Vital Signs [Data Includes: Last 1 Day]  25Aug2014 02:24PM  Temperature: 101.3 F 25Aug2014 01:59PM  BMI Calculated: 28.54 BSA Calculated: 2.25 Weight: 220 lb   Blood Pressure: 138 / 88 Heart Rate: 91  Physical Exam Constitutional: Well nourished and well developed . No acute distress. The patient appears dehydrated.  ENT:. The ears and nose are normal in appearance.  Neck: The appearance of the neck is normal.  Pulmonary: No respiratory distress and clear bilateral breath sounds.  tachycardic 91  Abdomen: The abdomen is soft and nontender. No suprapubic tenderness. Left CVA tenderness. Currently miminal flank or lower ab pain.  Lymphatics: The posterior cervical nodes are not enlarged or tender.  Skin: Normal skin turgor.  Neuro/Psych:. Mood and affect are appropriate.    Results/Data Urine [Data Includes: Last 1 Day]   25Aug2014  COLOR AMBER   APPEARANCE CLOUDY   SPECIFIC GRAVITY 1.025   pH 5.5   GLUCOSE NEG mg/dL  BILIRUBIN SMALL   KETONE 40 mg/dL  BLOOD MOD   PROTEIN 161 mg/dL  UROBILINOGEN 0.2 mg/dL  NITRITE NEG   LEUKOCYTE ESTERASE SMALL   SQUAMOUS EPITHELIAL/HPF RARE   WBC 21-50 WBC/hpf  RBC 3-6 RBC/hpf  BACTERIA FEW   CRYSTALS NONE SEEN   CASTS NONE SEEN   Other MUCUS NOTED    The following images/tracing/specimen were independently visualized:  CT indicating obstructing distal left ureteral stone w/ moderate hydronephrosis and perinephric fat stranding.  The following clinical lab reports were reviewed:  UA looks infected: +WBC 21-50, + bacteria, + RBC; Creatinine elevated 2.3 from 1.09 baseline.    Assessment Assessed  1. Nephrolithiasis 592.0 2. Renal Insufficiency 593.9 3. Working diagnosis of  Urinary Tract Infection 599.0   Mr. Austill is ill-appearing, pale, lethargic, vitals : 101F, tachycardic 91.  UA + WBC 21-50. Last intake of solid food was chicken noodle soup 3 days ago.  CT w/ obstructing left ureteral stone w/ elevated creatinine of 2.30.  He is clinically infected with an obstructing stone and will need an emergency ureteral stent placement.   Plan  Health Maintenance (V70.0)  1. UA With REFLEX   Done: 25Aug2014 01:42PM Nephrolithiasis (592.0)  2. CefTRIAXone Sodium 1 GM Injection Solution Reconstituted; INJECT 1  GM Intramuscular; Done:  25Aug2014 02:33PM; Status: COMPLETE Urinary Tract Infection (599.0)  3. URINE CULTURE  Done: 25Aug2014        Discussion/Summary  I informed Mr. Eshelman of the obstructing left ureteral stone on CT and in addition to his active infection of WBC 21-50 and temp 101 F, explained to him the importance of having an ureteral stent placed today by Dr. Mena Goes, an on call urogolist, to relieve the obstruction to prevent sepsis and damage to his left kidney.  I also explained the possible risks of stent placement :ureteral and bladder injuries, perforation, and worsening of infection.  Also informed him of possible side effect of stent: dysuria/hematuria/urinary frequency/urgency/risk of infection.  Mr. Treese verbalized understanding and is willing to undergo stent placement today.  Both Dr. Mena Goes, on-call urologist, and Dr. Annabell Howells, patient's urologist are aware of pt's current clinical situation.   Signatures Electronically signed by : Seward Grater, Dyann Ruddle; Mar 23 2013  4:35PM Electronically signed by :  Bjorn Pippin, M.D.; Mar 23 2013  4:44PM

## 2013-03-23 NOTE — Op Note (Signed)
Preop diagnosis: Left ureteral stone Left hydronephrosis Fever  Acute renal failure  Postop diagnosis: Same  Procedure: Cystoscopy Left retrograde pyelogram Left ureteral stent placement  Surgeon: Hasten Sweitzer Type of anesthesia: Gen.  Findings: On cystoscopy the prostate was short and nonobstructing, the urethra was normal. The bladder appeared normal with normal mucosa without tumor. There were no stones in the bladder.  Left retrograde pyelogram - filling defect in the left mid ureter consistent with the stone with very little contrast gaining proximal access. The mid and distal ureter appeared normal. After advancing an open-ended catheter into the region of the left collecting system and draining 40 cc of purulent urine a second retrograde was obtained which outlined a single collecting system and single ureter unit. With mild hydronephrosis confirming proper placement of catheter/wire in the collecting system.  Description of procedure: After consent was obtained patient brought to the operating room. After adequate anesthesia he is placed in lithotomy position and prepped and draped in the usual sterile fashion. A timeout was performed to confirm the patient and procedure. A cystoscope was passed per urethra and the bladder examined. An open-ended catheter was used to cannulate the left ureteral orifice and left retrograde contrast was injected. A sensor wire was then advanced and made contact with the stone in the mid ureter and then went around it up into the region of the collecting system. The open-ended catheter was advanced where they contacted the stone in the mid ureter with some mild resistance. The catheter was advanced into the region of the collecting system and the wire removed. Purulent urine a total of 40 cc was drained. Some of this was sent for urine culture. Having confirm proper proximal access the sensor wire was advanced into the upper pole collecting system. The  open-ended catheter was removed and a 6 x 26 cm left ureteral stent was advanced and the wire removed. A good coil was seen in the kidney and a good coil in the bladder. The stent was draining well. The bladder was drained and the scope removed. The patient was taken to recovery room in stable condition.  Specimens: Left kidney aspirate collecting system for culture  Drains: 6 x 26 cm left ureteral stent without tether  Complications: None Blood loss: None  Disposition: Patient stable to PACU

## 2013-03-23 NOTE — Interval H&P Note (Signed)
History and Physical Interval Note:  03/23/2013 6:00 PM  Richard [name]  has presented today for surgery, with the diagnosis of ureteral stone  The various methods of treatment have been discussed with the patient. After consideration of risks, benefits and other options for treatment, the patient has consented to  Procedure(s): CYSTOSCOPY WITH STENT PLACEMENT (Left) as a surgical intervention .  The patient's history has been reviewed, I discussed patient with Nadine and Dr. Annabell Howells, patient examined, no change in status, stable for surgery.  I have reviewed the patient's chart and labs.  I discussed with the patient the nature, potential benefits, risks and alternatives to cysto, left stent, including side effects of the proposed treatment, the likelihood of the patient achieving the goals of the procedure, and any potential problems that might occur during the procedure or recuperation. All questions answered. Patient elects to proceed He said he has had a nephrostomy tube in past and wants to avoid it if at all possible.  Urine Cx pending in office, Cr up to 2.3. Pt received Rocephin in office. He is allergic to Cipro.    Antony Haste

## 2013-03-23 NOTE — Preoperative (Signed)
Beta Blockers   Reason not to administer Beta Blockers:Not Applicable 

## 2013-03-24 ENCOUNTER — Encounter (HOSPITAL_COMMUNITY): Payer: Self-pay | Admitting: Urology

## 2013-03-24 ENCOUNTER — Observation Stay (HOSPITAL_COMMUNITY): Payer: Medicare Other

## 2013-03-24 DIAGNOSIS — A419 Sepsis, unspecified organism: Secondary | ICD-10-CM

## 2013-03-24 DIAGNOSIS — N132 Hydronephrosis with renal and ureteral calculous obstruction: Secondary | ICD-10-CM | POA: Diagnosis present

## 2013-03-24 HISTORY — DX: Hydronephrosis with renal and ureteral calculous obstruction: N13.2

## 2013-03-24 HISTORY — DX: Sepsis, unspecified organism: A41.9

## 2013-03-24 LAB — BASIC METABOLIC PANEL
Calcium: 8.4 mg/dL (ref 8.4–10.5)
GFR calc Af Amer: 37 mL/min — ABNORMAL LOW (ref >90)
GFR calc non Af Amer: 32 mL/min — ABNORMAL LOW (ref >90)
Glucose, Bld: 124 mg/dL — ABNORMAL HIGH (ref 70–99)
Potassium: 4.2 mEq/L (ref 3.5–5.1)
Sodium: 137 mEq/L (ref 135–145)

## 2013-03-24 LAB — CBC
Hemoglobin: 12.2 g/dL — ABNORMAL LOW (ref 13.0–17.0)
Platelets: 120 10*3/uL — ABNORMAL LOW (ref 150–400)
RBC: 4.62 MIL/uL (ref 4.22–5.81)
WBC: 8.3 10*3/uL (ref 4.0–10.5)

## 2013-03-24 NOTE — Progress Notes (Signed)
Patient ID: Richard [name], male   DOB: September 08, 1945, 66 y.o.   MRN: [id number] 1 Day Post-Op  Subjective: Richard [name] had a left ureteral stent placement yesterday for a left mid ureteral stone with obstruction and sepsis.   He is doing better this morning and denies pain or voiding complaints.   He remains febrile to 101.5 on rocephin.  He had Unasyn as well.   A culture is pending at our office.   ROS: Negative except as above.  He has no nausea.  He does have fever.   Objective: Vital signs in last 24 hours: Temp:  [98.8 F (37.1 C)-102.5 F (39.2 C)] 101.5 F (38.6 C) (08/26 0600) Pulse Rate:  [74-94] 90 (08/26 0505) Resp:  [13-25] 16 (08/26 0505) BP: (126-150)/(62-79) 140/75 mmHg (08/26 0505) SpO2:  [88 %-100 %] 95 % (08/26 0505) Weight:  [102.059 kg (225 lb)] 102.059 kg (225 lb) (08/25 1753)  Intake/Output from previous day: 08/25 0701 - 08/26 0700 In: 3172.5 [P.O.:240; I.V.:2882.5; IV Piggyback:50] Out: 1125 [Urine:1125] Intake/Output this shift: Total I/O In: -  Out: 300 [Urine:300]  General appearance: alert and no distress GI: soft, non-tender; bowel sounds normal; no masses,  no organomegaly  Lab Results:   Recent Labs  03/23/13 1730 03/24/13 0337  WBC 12.8* 8.3  HGB 15.4 12.2*  HCT 47.4 38.4*  PLT 154 120*   BMET  Recent Labs  03/23/13 1730 03/24/13 0337  NA 137 137  K 4.0 4.2  CL 100 103  CO2 28 30  GLUCOSE 115* 124*  BUN 18 17  CREATININE 2.29* 2.07*  CALCIUM 9.5 8.4   PT/INR No results found for this basename: LABPROT, INR,  in the last 72 hours ABG No results found for this basename: PHART, PCO2, PO2, HCO3,  in the last 72 hours  Studies/Results: Dg Chest 2 View  03/23/2013   *RADIOLOGY REPORT*  Clinical Data: Preop for left kidney stone removal.  CHEST - 2 VIEW  Comparison: 08/03/2004  Findings: Mid to lower thoracic spondylosis.  Patient rotated minimally left.  Apparent mild tracheal deviation leftward is felt to be secondary. Normal heart  size and mediastinal contours. No pleural effusion or pneumothorax.  Mild volume loss at the bases with increased left and developing right base atelectasis or scarring.  IMPRESSION: No acute cardiopulmonary disease.  Increased left and developing right base atelectasis or scar.   Original Report Authenticated By: Jeronimo Greaves, M.D.    Anti-infectives: Anti-infectives   Start     Dose/Rate Route Frequency Ordered Stop   03/24/13 0800  cefTRIAXone (ROCEPHIN) 1 g in dextrose 5 % 50 mL IVPB     1 g 100 mL/hr over 30 Minutes Intravenous Every 24 hours 03/23/13 2027     03/23/13 1835  ampicillin-sulbactam (UNASYN) 1.5 g in sodium chloride 0.9 % 50 mL IVPB     1.5 g 100 mL/hr over 30 Minutes Intravenous 30 min pre-op 03/23/13 1835 03/23/13 1823      Current Facility-Administered Medications  Medication Dose Route Frequency Provider Last Rate Last Dose  . 0.9 %  sodium chloride infusion   Intravenous Continuous Antony Haste, MD 150 mL/hr at 03/23/13 2047    . acetaminophen (TYLENOL) tablet 650 mg  650 mg Oral Q4H PRN Antony Haste, MD   650 mg at 03/24/13 0645  . amLODipine (NORVASC) tablet 5 mg  5 mg Oral Daily Antony Haste, MD      . aspirin EC tablet 81 mg  81 mg  Oral Daily Antony Haste, MD   81 mg at 03/23/13 2118  . [START ON 03/25/2013] atorvastatin (LIPITOR) tablet 20 mg  20 mg Oral Custom Antony Haste, MD      . cefTRIAXone (ROCEPHIN) 1 g in dextrose 5 % 50 mL IVPB  1 g Intravenous Q24H Antony Haste, MD      . diphenhydrAMINE (BENADRYL) injection 12.5 mg  12.5 mg Intravenous Q6H PRN Antony Haste, MD       Or  . diphenhydrAMINE (BENADRYL) 12.5 MG/5ML elixir 12.5 mg  12.5 mg Oral Q6H PRN Antony Haste, MD      . docusate sodium (COLACE) capsule 100 mg  100 mg Oral BID Antony Haste, MD   100 mg at 03/23/13 2118  . HYDROcodone-acetaminophen (NORCO/VICODIN) 5-325 MG per tablet 1-2 tablet  1-2 tablet  Oral Q4H PRN Antony Haste, MD   2 tablet at 03/24/13 0020  . HYDROmorphone (DILAUDID) injection 0.5-1 mg  0.5-1 mg Intravenous Q2H PRN Antony Haste, MD      . hyoscyamine (LEVSIN SL) SL tablet 0.125 mg  0.125 mg Oral Q4H PRN Antony Haste, MD      . lactated ringers infusion   Intravenous Continuous Florene Route, CRNA      . metoprolol tartrate (LOPRESSOR) tablet 25 mg  25 mg Oral BID Antony Haste, MD   25 mg at 03/23/13 2118  . ondansetron (ZOFRAN) injection 4 mg  4 mg Intravenous Q4H PRN Antony Haste, MD      . zolpidem [facility]) tablet 5 mg  5 mg Oral QHS PRN Antony Haste, MD        Assessment: s/p Procedure(s): CYSTOSCOPY WITH RETROGRADE PYELOGRAM/URETERAL STENT PLACEMENT  He is clinically improving with a normalization of his leukocytosis and improved Cr, but he remains febrile.   Plan: Continue IV rocephin pending the culture. KUB today to assess stone position.  BMP in AM I may broaden his coverage if the temperature remains elevated.     LOS: 1 day    Ashani Pumphrey J 03/24/2013

## 2013-03-25 ENCOUNTER — Encounter (HOSPITAL_COMMUNITY): Payer: Self-pay | Admitting: *Deleted

## 2013-03-25 ENCOUNTER — Other Ambulatory Visit: Payer: Self-pay | Admitting: Urology

## 2013-03-25 LAB — BASIC METABOLIC PANEL
BUN: 16 mg/dL (ref 6–23)
CO2: 27 mEq/L (ref 19–32)
Calcium: 8 mg/dL — ABNORMAL LOW (ref 8.4–10.5)
GFR calc non Af Amer: 45 mL/min — ABNORMAL LOW (ref >90)
Glucose, Bld: 110 mg/dL — ABNORMAL HIGH (ref 70–99)

## 2013-03-25 MED ORDER — OXYCODONE-ACETAMINOPHEN 5-325 MG PO TABS: 1.0000 | ORAL_TABLET | ORAL | Status: DC | PRN

## 2013-03-25 MED ORDER — CEFDINIR 300 MG PO CAPS: 300.0000 mg | ORAL_CAPSULE | Freq: Two times a day (BID) | ORAL | Status: DC

## 2013-03-25 NOTE — Progress Notes (Signed)
Notified Chryl Heck of same day surgery first case for 03/31/13,  Chest x ray, EKG 03/23/13 EPIC

## 2013-03-25 NOTE — Discharge Summary (Signed)
Physician Discharge Summary  Patient ID: Richard [name] MRN: [id number] DOB/AGE: [birth year] 67 y.o.  Admit date: 03/23/2013 Discharge date: 03/25/2013  Admission Diagnoses:  Ureteral stone with hydronephrosis Sepsis  Discharge Diagnoses:  Principal Problem:   Ureteral stone with hydronephrosis Sepsis with ARI  Past Medical History  Diagnosis Date  . Squamous cell carcinoma, face     Dr Irene Limbo  . Nephrolithiasis     left   . Hyperlipidemia     LDL goal = < 100 based on NMR Lipoproprofile  . Fasting hyperglycemia   . Hypertension   . Arthritis   . Aneurysm     left common iliac-stable  . Sepsis 03/24/2013  . Ureteral stone with hydronephrosis 03/24/2013    left  . Renal insufficiency     Surgeries: Procedure(s): CYSTOSCOPY WITH RETROGRADE PYELOGRAM/URETERAL STENT PLACEMENT on 03/23/2013   Consultants (if any): none   Discharged Condition: Improved  Hospital Course: Richard [name] is an 67 y.o. male who was admitted 03/23/2013 with a diagnosis of Ureteral stone with hydronephrosis with associated sepsis and ARI and went to the operating room on 03/23/2013 and underwent the above named procedures.    He was given Unasyn initially and then was maintained on rocephin with deffervesence.   He was without complaints this morning and was felt to be ready for discharge.   His office urine culture was negative and the culture from his left renal pelvis is pending.   He will be scheduled for ureteroscopic stone extraction next week and will be discharged on cefdinir.  His cr had falled to 1.56 from 2.59 on admission but he is not quite back to baseline.   He was given perioperative antibiotics:  Anti-infectives   Start     Dose/Rate Route Frequency Ordered Stop   03/25/13 0000  cefdinir (OMNICEF) 300 MG capsule     300 mg Oral 2 times daily 03/25/13 0701     03/24/13 0800  cefTRIAXone (ROCEPHIN) 1 g in dextrose 5 % 50 mL IVPB     1 g 100 mL/hr over 30 Minutes Intravenous Every 24  hours 03/23/13 2027     03/23/13 1835  ampicillin-sulbactam (UNASYN) 1.5 g in sodium chloride 0.9 % 50 mL IVPB     1.5 g 100 mL/hr over 30 Minutes Intravenous 30 min pre-op 03/23/13 1835 03/23/13 1823    .  He was given sequential compression devicesfor DVT prophylaxis.  He benefited maximally from the hospital stay and there were no complications.    Recent vital signs:  Filed Vitals:   03/25/13 0435  BP: 143/77  Pulse: 63  Temp: 98.7 F (37.1 C)  Resp: 20    Recent laboratory studies:  Lab Results  Component Value Date   HGB 12.2* 03/24/2013   HGB 15.4 03/23/2013   HGB 11.9* 02/29/2012   Lab Results  Component Value Date   WBC 8.3 03/24/2013   PLT 120* 03/24/2013   Lab Results  Component Value Date   INR 1.03 02/28/2012   Lab Results  Component Value Date   NA 139 03/25/2013   K 3.5 03/25/2013   CL 105 03/25/2013   CO2 27 03/25/2013   BUN 16 03/25/2013   CREATININE 1.56* 03/25/2013   GLUCOSE 110* 03/25/2013    Discharge Medications:     Medication List         ADVIL 200 MG Caps  Generic drug:  Ibuprofen  Take 600-800 mg by mouth every 6 (six) hours as needed. For  pain     amLODipine 5 MG tablet  Commonly known as:  NORVASC  Take 1 tablet (5 mg total) by mouth daily.     aspirin 81 MG EC tablet  Take 81 mg by mouth daily.     atorvastatin 20 MG tablet  Commonly known as:  LIPITOR  Take 1 tablet (20 mg total) by mouth daily. Monday Wed Friday Sunday Only     cefdinir 300 MG capsule  Commonly known as:  OMNICEF  Take 1 capsule (300 mg total) by mouth 2 (two) times daily.     metoprolol tartrate 25 MG tablet  Commonly known as:  LOPRESSOR  Take 1 tablet (25 mg total) by mouth 2 (two) times daily.     MULTIVITAMIN PO  Take 1 tablet by mouth every morning.     oxyCODONE-acetaminophen 5-325 MG per tablet  Commonly known as:  ROXICET  Take 1 tablet by mouth every 4 (four) hours as needed for pain.        Diagnostic Studies: Dg Chest 2 View  03/23/2013    *RADIOLOGY REPORT*  Clinical Data: Preop for left kidney stone removal.  CHEST - 2 VIEW  Comparison: 08/03/2004  Findings: Mid to lower thoracic spondylosis.  Patient rotated minimally left.  Apparent mild tracheal deviation leftward is felt to be secondary. Normal heart size and mediastinal contours. No pleural effusion or pneumothorax.  Mild volume loss at the bases with increased left and developing right base atelectasis or scarring.  IMPRESSION: No acute cardiopulmonary disease.  Increased left and developing right base atelectasis or scar.   Original Report Authenticated By: Jeronimo Greaves, M.D.   Dg Abd 1 View  03/24/2013   *RADIOLOGY REPORT*  Clinical Data: Process left-sided ureteral stone  ABDOMEN - 1 VIEW  Comparison: CT, 03/23/2013  Findings: 7.5 mm stone lies adjacent to the proximal mid left ureteral stent, stable in position from the previous day's CT.  The right intrarenal stone seen on CT is only vaguely evident on the standard radiograph.  The soft tissues are otherwise unremarkable.  There is a normal bowel gas pattern.  IMPRESSION: 7.5 mm left ureteral stone lies adjacent to the ureteral stent, unchanged in position from the previous day's CT.   Original Report Authenticated By: Amie Portland, M.D.    Disposition: 01-Home or Self Care       Future Appointments Provider Department Dept Phone   09/22/2013 8:30 AM [id number] [place] [facility] AT [address] [phone]   09/22/2013 9:00 AM [address] [place] [facility] AT [address] [phone]   Patient to arrive 15 minutes prior to appointment time.   09/22/2013 10:00 AM Pryor Ochoa, MD [facility] [phone]      Follow-up Information   Follow up with Anner Crete, MD In 2 weeks. (Office will call regarding second procedure scheduling)    Specialty:  Urology   Contact information:   [address] [place] [address] [phone]         Signed: Anner Crete 03/25/2013, 8:44 AM

## 2013-03-25 NOTE — Progress Notes (Signed)
Dr Annabell Howells-  Need PRE OP ORDERS FOR 03/31/13  PLEASE     THANKS

## 2013-03-26 LAB — URINE CULTURE: Colony Count: >=100000

## 2013-03-27 ENCOUNTER — Other Ambulatory Visit: Payer: Self-pay | Admitting: Urology

## 2013-03-27 ENCOUNTER — Other Ambulatory Visit (HOSPITAL_COMMUNITY): Payer: Self-pay | Admitting: Urology

## 2013-03-30 MED ORDER — VANCOMYCIN HCL 10 G IV SOLR
1500.0000 mg | Freq: Once | INTRAVENOUS | Status: AC
  Administered 2013-03-31: 1500 mg via INTRAVENOUS
  Filled 2013-03-30: qty 1500

## 2013-03-30 MED ORDER — GENTAMICIN SULFATE 40 MG/ML IJ SOLN
5.0000 mg/kg | INTRAVENOUS | Status: AC
  Administered 2013-03-31: 444 mg via INTRAVENOUS
  Filled 2013-03-30: qty 11.1

## 2013-03-30 NOTE — Anesthesia Preprocedure Evaluation (Addendum)
Anesthesia Evaluation  Patient identified by MRN, date of birth, ID band Patient awake    Reviewed: Allergy & Precautions, H&P , NPO status , Patient's Chart, lab work & pertinent test results, reviewed documented beta blocker date and time   Airway Mallampati: II TM Distance: >3 FB Neck ROM: full    Dental no notable dental hx. (+) Teeth Intact and Dental Advisory Given   Pulmonary neg pulmonary ROS,  breath sounds clear to auscultation  Pulmonary exam normal       Cardiovascular Exercise Tolerance: Good hypertension, Pt. on home beta blockers negative cardio ROS  Rhythm:regular Rate:Normal     Neuro/Psych negative neurological ROS  negative psych ROS   GI/Hepatic negative GI ROS, Neg liver ROS,   Endo/Other  negative endocrine ROS  Renal/GU negative Renal ROS  negative genitourinary   Musculoskeletal   Abdominal   Peds  Hematology negative hematology ROS (+)   Anesthesia Other Findings   Reproductive/Obstetrics negative OB ROS                          Anesthesia Physical Anesthesia Plan  ASA: II  Anesthesia Plan: General   Post-op Pain Management:    Induction: Intravenous  Airway Management Planned: LMA  Additional Equipment:   Intra-op Plan:   Post-operative Plan:   Informed Consent: I have reviewed the patients History and Physical, chart, labs and discussed the procedure including the risks, benefits and alternatives for the proposed anesthesia with the patient or authorized representative who has indicated his/her understanding and acceptance.   Dental Advisory Given  Plan Discussed with: CRNA and Surgeon  Anesthesia Plan Comments:         Anesthesia Quick Evaluation

## 2013-03-31 ENCOUNTER — Encounter (HOSPITAL_COMMUNITY)
Admission: RE | Disposition: A | Discharge status: Home / Self Care | Payer: Self-pay | Source: Ambulatory Visit | Attending: Urology

## 2013-03-31 ENCOUNTER — Ambulatory Visit (HOSPITAL_COMMUNITY): Payer: Medicare Other | Admitting: Anesthesiology

## 2013-03-31 ENCOUNTER — Encounter (HOSPITAL_COMMUNITY): Payer: Self-pay | Admitting: *Deleted

## 2013-03-31 ENCOUNTER — Ambulatory Visit (HOSPITAL_COMMUNITY)
Admission: RE | Admit: 2013-03-31 | Discharge: 2013-03-31 | Disposition: A | Discharge status: Home / Self Care | Payer: Medicare Other | Source: Ambulatory Visit | Attending: Urology | Admitting: Urology

## 2013-03-31 ENCOUNTER — Encounter (HOSPITAL_COMMUNITY): Payer: Self-pay | Admitting: Anesthesiology

## 2013-03-31 DIAGNOSIS — I10 Essential (primary) hypertension: Secondary | ICD-10-CM | POA: Insufficient documentation

## 2013-03-31 DIAGNOSIS — N201 Calculus of ureter: Secondary | ICD-10-CM | POA: Insufficient documentation

## 2013-03-31 DIAGNOSIS — Z8614 Personal history of Methicillin resistant Staphylococcus aureus infection: Secondary | ICD-10-CM | POA: Insufficient documentation

## 2013-03-31 HISTORY — PX: URETEROSCOPY: SHX842

## 2013-03-31 HISTORY — PX: HOLMIUM LASER APPLICATION: SHX5852

## 2013-03-31 LAB — BASIC METABOLIC PANEL
BUN: 26 mg/dL — ABNORMAL HIGH (ref 6–23)
CO2: 25 mEq/L (ref 19–32)
Chloride: 104 mEq/L (ref 96–112)
Creatinine, Ser: 1.24 mg/dL (ref 0.50–1.35)
GFR calc Af Amer: 69 mL/min — ABNORMAL LOW (ref >90)
Glucose, Bld: 116 mg/dL — ABNORMAL HIGH (ref 70–99)
Potassium: 4 mEq/L (ref 3.5–5.1)

## 2013-03-31 LAB — CBC
HCT: 43.6 % (ref 39.0–52.0)
Hemoglobin: 14 g/dL (ref 13.0–17.0)
MCV: 81.3 fL (ref 78.0–100.0)
RBC: 5.36 MIL/uL (ref 4.22–5.81)
RDW: 14.8 % (ref 11.5–15.5)
WBC: 8.9 10*3/uL (ref 4.0–10.5)

## 2013-03-31 SURGERY — URETEROSCOPY
Anesthesia: General | Laterality: Left | Wound class: Clean Contaminated

## 2013-03-31 MED ORDER — IOHEXOL 300 MG/ML  SOLN
INTRAMUSCULAR | Status: AC
  Filled 2013-03-31: qty 1

## 2013-03-31 MED ORDER — ONDANSETRON HCL 4 MG/2ML IJ SOLN: 4.0000 mg | Freq: Four times a day (QID) | INTRAMUSCULAR | Status: DC | PRN

## 2013-03-31 MED ORDER — ACETAMINOPHEN 325 MG PO TABS: 650.0000 mg | ORAL_TABLET | ORAL | Status: DC | PRN

## 2013-03-31 MED ORDER — SODIUM CHLORIDE 0.9 % IV SOLN
INTRAVENOUS | Status: DC | PRN
  Administered 2013-03-31: 08:00:00 via INTRAVENOUS

## 2013-03-31 MED ORDER — SODIUM CHLORIDE 0.9 % IV SOLN: 250.0000 mL | INTRAVENOUS | Status: DC | PRN

## 2013-03-31 MED ORDER — SODIUM CHLORIDE 0.9 % IJ SOLN: 3.0000 mL | INTRAMUSCULAR | Status: DC | PRN

## 2013-03-31 MED ORDER — ACETAMINOPHEN 650 MG RE SUPP
650.0000 mg | RECTAL | Status: DC | PRN
  Filled 2013-03-31: qty 1

## 2013-03-31 MED ORDER — SODIUM CHLORIDE 0.9 % IJ SOLN: 3.0000 mL | Freq: Two times a day (BID) | INTRAMUSCULAR | Status: DC

## 2013-03-31 MED ORDER — IOHEXOL 300 MG/ML  SOLN
INTRAMUSCULAR | Status: DC | PRN
  Administered 2013-03-31: 1 mL via INTRAVENOUS

## 2013-03-31 MED ORDER — BELLADONNA ALKALOIDS-OPIUM 16.2-60 MG RE SUPP
RECTAL | Status: AC
  Filled 2013-03-31: qty 1

## 2013-03-31 MED ORDER — PROPOFOL 10 MG/ML IV BOLUS
INTRAVENOUS | Status: DC | PRN
  Administered 2013-03-31: 200 mg via INTRAVENOUS

## 2013-03-31 MED ORDER — BELLADONNA ALKALOIDS-OPIUM 16.2-60 MG RE SUPP
RECTAL | Status: DC | PRN
  Administered 2013-03-31: 1 via RECTAL

## 2013-03-31 MED ORDER — LIDOCAINE HCL (CARDIAC) 20 MG/ML IV SOLN
INTRAVENOUS | Status: DC | PRN
  Administered 2013-03-31: 100 mg via INTRAVENOUS

## 2013-03-31 MED ORDER — FENTANYL CITRATE 0.05 MG/ML IJ SOLN
INTRAMUSCULAR | Status: DC | PRN
  Administered 2013-03-31 (×2): 50 ug via INTRAVENOUS

## 2013-03-31 MED ORDER — OXYCODONE HCL 5 MG PO TABS: 5.0000 mg | ORAL_TABLET | ORAL | Status: DC | PRN

## 2013-03-31 MED ORDER — KETOROLAC TROMETHAMINE 30 MG/ML IJ SOLN
INTRAMUSCULAR | Status: DC | PRN
  Administered 2013-03-31: 30 mg via INTRAVENOUS

## 2013-03-31 MED ORDER — STERILE WATER FOR IRRIGATION IR SOLN
Status: DC | PRN
  Administered 2013-03-31: 1000 mL

## 2013-03-31 MED ORDER — LACTATED RINGERS IV SOLN: INTRAVENOUS | Status: DC

## 2013-03-31 MED ORDER — FENTANYL CITRATE 0.05 MG/ML IJ SOLN: 25.0000 ug | INTRAMUSCULAR | Status: DC | PRN

## 2013-03-31 MED ORDER — OXYCODONE-ACETAMINOPHEN 5-325 MG PO TABS: 1.0000 | ORAL_TABLET | ORAL | Status: DC | PRN

## 2013-03-31 MED ORDER — MIDAZOLAM HCL 5 MG/5ML IJ SOLN
INTRAMUSCULAR | Status: DC | PRN
  Administered 2013-03-31: 2 mg via INTRAVENOUS

## 2013-03-31 MED ORDER — SODIUM CHLORIDE 0.9 % IR SOLN
Status: DC | PRN
  Administered 2013-03-31: 4000 mL

## 2013-03-31 MED ORDER — LACTATED RINGERS IV SOLN
INTRAVENOUS | Status: DC | PRN
  Administered 2013-03-31: 07:00:00 via INTRAVENOUS

## 2013-03-31 SURGICAL SUPPLY — 19 items
BAG URO CATCHER STRL LF (DRAPE) ×2 IMPLANT
BASKET LASER NITINOL 1.9FR (BASKET) ×1 IMPLANT
BASKET ZERO TIP NITINOL 2.4FR (BASKET) ×1 IMPLANT
BSKT STON RTRVL 120 1.9FR (BASKET)
BSKT STON RTRVL ZERO TP 2.4FR (BASKET) ×1
CATH URET 5FR 28IN OPEN ENDED (CATHETERS) ×2 IMPLANT
CLOTH BEACON ORANGE TIMEOUT ST (SAFETY) ×2 IMPLANT
DRAPE CAMERA CLOSED 9X96 (DRAPES) ×2 IMPLANT
GLOVE SURG SS PI 8.0 STRL IVOR (GLOVE) ×2 IMPLANT
GOWN PREVENTION PLUS XLARGE (GOWN DISPOSABLE) ×2 IMPLANT
GOWN STRL REIN XL XLG (GOWN DISPOSABLE) ×2 IMPLANT
GUIDEWIRE ANG ZIPWIRE 038X150 (WIRE) IMPLANT
GUIDEWIRE STR DUAL SENSOR (WIRE) IMPLANT
LASER FIBER DISP (UROLOGICAL SUPPLIES) ×1 IMPLANT
MANIFOLD NEPTUNE II (INSTRUMENTS) ×2 IMPLANT
MARKER SKIN DUAL TIP RULER LAB (MISCELLANEOUS) ×2 IMPLANT
PACK CYSTO (CUSTOM PROCEDURE TRAY) ×2 IMPLANT
TUBING CONNECTING 10 (TUBING) ×2 IMPLANT
WIRE COONS/BENSON .038X145CM (WIRE) ×2 IMPLANT

## 2013-03-31 NOTE — Anesthesia Postprocedure Evaluation (Signed)
  Anesthesia Post-op Note  Patient: Richard [name]  Procedure(s) Performed: Procedure(s) (LRB): URETEROSCOPY (Left) HOLMIUM LASER APPLICATION (Left)  Patient Location: PACU  Anesthesia Type: General  Level of Consciousness: awake and alert   Airway and Oxygen Therapy: Patient Spontanous Breathing  Post-op Pain: mild  Post-op Assessment: Post-op Vital signs reviewed, Patient's Cardiovascular Status Stable, Respiratory Function Stable, Patent Airway and No signs of Nausea or vomiting  Last Vitals:  Filed Vitals:   03/31/13 0830  BP:   Pulse: 56  Temp:   Resp: 12    Post-op Vital Signs: stable   Complications: No apparent anesthesia complications

## 2013-03-31 NOTE — Transfer of Care (Signed)
Immediate Anesthesia Transfer of Care Note  Patient: Richard [name]  Procedure(s) Performed: Procedure(s): URETEROSCOPY (Left) HOLMIUM LASER APPLICATION (Left)  Patient Location: PACU  Anesthesia Type:General  Level of Consciousness: awake, alert  and oriented  Airway & Oxygen Therapy: Patient Spontanous Breathing and Patient connected to face mask oxygen  Post-op Assessment: Post -op Vital signs reviewed and stable  Post vital signs: Reviewed and stable  Complications: No apparent anesthesia complications

## 2013-03-31 NOTE — Preoperative (Signed)
Beta Blockers   Reason not to administer Beta Blockers:Not Applicable 

## 2013-03-31 NOTE — Brief Op Note (Signed)
03/31/2013  8:10 AM  PATIENT:  Almedia Balls Tilley  67 y.o. male  PRE-OPERATIVE DIAGNOSIS:  LEFT URETERAL STONE  POST-OPERATIVE DIAGNOSIS:  LEFT URETERAL STONE  PROCEDURE:  Procedure(s): URETEROSCOPY (Left) HOLMIUM LASER APPLICATION (Left) Removal left ureteral stent  SURGEON:  Surgeon(s) and Role:    * Anner Crete, MD - Primary  PHYSICIAN ASSISTANT:   ASSISTANTS: none   ANESTHESIA:   general  EBL:     BLOOD ADMINISTERED:none  DRAINS: none   LOCAL MEDICATIONS USED:  NONE  SPECIMEN:  Source of Specimen:  ureteral stone fragments.   DISPOSITION OF SPECIMEN:  to patient to bring to office  COUNTS:  YES  TOURNIQUET:  * No tourniquets in log *  DICTATION: .Other Dictation: Dictation Number [id number]  PLAN OF CARE: Discharge to home after PACU  PATIENT DISPOSITION:  PACU - hemodynamically stable.   Delay start of Pharmacological VTE agent (>24hrs) due to surgical blood loss or risk of bleeding: not applicable

## 2013-03-31 NOTE — Interval H&P Note (Signed)
History and Physical Interval Note:  His culture grew a resistant staph and he was placed on macrobid and has remained afebrile.   He will get Vanc and Gent preop today.   03/31/2013 7:22 AM  Richard [name]  has presented today for surgery, with the diagnosis of LEFT URETERAL STONE  The various methods of treatment have been discussed with the patient and family. After consideration of risks, benefits and other options for treatment, the patient has consented to  Procedure(s): URETEROSCOPY (Left) HOLMIUM LASER APPLICATION (Left) as a surgical intervention .  The patient's history has been reviewed, patient examined, no change in status, stable for surgery.  I have reviewed the patient's chart and labs.  Questions were answered to the patient's satisfaction.     [name] J

## 2013-03-31 NOTE — H&P (View-Only) (Signed)
Patient ID: Richard [name], male   DOB: 09/29/1945, 67 y.o.   MRN: [id number] 1 Day Post-Op  Subjective: Richard [name] had a left ureteral stent placement yesterday for a left mid ureteral stone with obstruction and sepsis.   He is doing better this morning and denies pain or voiding complaints.   He remains febrile to 101.5 on rocephin.  He had Unasyn as well.   A culture is pending at our office.   ROS: Negative except as above.  He has no nausea.  He does have fever.   Objective: Vital signs in last 24 hours: Temp:  [98.8 F (37.1 C)-102.5 F (39.2 C)] 101.5 F (38.6 C) (08/26 0600) Pulse Rate:  [74-94] 90 (08/26 0505) Resp:  [13-25] 16 (08/26 0505) BP: (126-150)/(62-79) 140/75 mmHg (08/26 0505) SpO2:  [88 %-100 %] 95 % (08/26 0505) Weight:  [102.059 kg (225 lb)] 102.059 kg (225 lb) (08/25 1753)  Intake/Output from previous day: 08/25 0701 - 08/26 0700 In: 3172.5 [P.O.:240; I.V.:2882.5; IV Piggyback:50] Out: 1125 [Urine:1125] Intake/Output this shift: Total I/O In: -  Out: 300 [Urine:300]  General appearance: alert and no distress GI: soft, non-tender; bowel sounds normal; no masses,  no organomegaly  Lab Results:   Recent Labs  03/23/13 1730 03/24/13 0337  WBC 12.8* 8.3  HGB 15.4 12.2*  HCT 47.4 38.4*  PLT 154 120*   BMET  Recent Labs  03/23/13 1730 03/24/13 0337  NA 137 137  K 4.0 4.2  CL 100 103  CO2 28 30  GLUCOSE 115* 124*  BUN 18 17  CREATININE 2.29* 2.07*  CALCIUM 9.5 8.4   PT/INR No results found for this basename: LABPROT, INR,  in the last 72 hours ABG No results found for this basename: PHART, PCO2, PO2, HCO3,  in the last 72 hours  Studies/Results: Dg Chest 2 View  03/23/2013   *RADIOLOGY REPORT*  Clinical Data: Preop for left kidney stone removal.  CHEST - 2 VIEW  Comparison: 08/03/2004  Findings: Mid to lower thoracic spondylosis.  Patient rotated minimally left.  Apparent mild tracheal deviation leftward is felt to be secondary. Normal heart  size and mediastinal contours. No pleural effusion or pneumothorax.  Mild volume loss at the bases with increased left and developing right base atelectasis or scarring.  IMPRESSION: No acute cardiopulmonary disease.  Increased left and developing right base atelectasis or scar.   Original Report Authenticated By: Kyle Talbot, M.D.    Anti-infectives: Anti-infectives   Start     Dose/Rate Route Frequency Ordered Stop   03/24/13 0800  cefTRIAXone (ROCEPHIN) 1 g in dextrose 5 % 50 mL IVPB     1 g 100 mL/hr over 30 Minutes Intravenous Every 24 hours 03/23/13 2027     03/23/13 1835  ampicillin-sulbactam (UNASYN) 1.5 g in sodium chloride 0.9 % 50 mL IVPB     1.5 g 100 mL/hr over 30 Minutes Intravenous 30 min pre-op 03/23/13 1835 03/23/13 1823      Current Facility-Administered Medications  Medication Dose Route Frequency Provider Last Rate Last Dose  . 0.9 %  sodium chloride infusion   Intravenous Continuous Matthew Ramsey Eskridge, MD 150 mL/hr at 03/23/13 2047    . acetaminophen (TYLENOL) tablet 650 mg  650 mg Oral Q4H PRN Matthew Ramsey Eskridge, MD   650 mg at 03/24/13 0645  . amLODipine (NORVASC) tablet 5 mg  5 mg Oral Daily Matthew Ramsey Eskridge, MD      . aspirin EC tablet 81 mg  81 mg   Oral Daily Matthew Ramsey Eskridge, MD   81 mg at 03/23/13 2118  . [START ON 03/25/2013] atorvastatin (LIPITOR) tablet 20 mg  20 mg Oral Custom Matthew Ramsey Eskridge, MD      . cefTRIAXone (ROCEPHIN) 1 g in dextrose 5 % 50 mL IVPB  1 g Intravenous Q24H Matthew Ramsey Eskridge, MD      . diphenhydrAMINE (BENADRYL) injection 12.5 mg  12.5 mg Intravenous Q6H PRN Matthew Ramsey Eskridge, MD       Or  . diphenhydrAMINE (BENADRYL) 12.5 MG/5ML elixir 12.5 mg  12.5 mg Oral Q6H PRN Matthew Ramsey Eskridge, MD      . docusate sodium (COLACE) capsule 100 mg  100 mg Oral BID Matthew Ramsey Eskridge, MD   100 mg at 03/23/13 2118  . HYDROcodone-acetaminophen (NORCO/VICODIN) 5-325 MG per tablet 1-2 tablet  1-2 tablet  Oral Q4H PRN Matthew Ramsey Eskridge, MD   2 tablet at 03/24/13 0020  . HYDROmorphone (DILAUDID) injection 0.5-1 mg  0.5-1 mg Intravenous Q2H PRN Matthew Ramsey Eskridge, MD      . hyoscyamine (LEVSIN SL) SL tablet 0.125 mg  0.125 mg Oral Q4H PRN Matthew Ramsey Eskridge, MD      . lactated ringers infusion   Intravenous Continuous Diana L Reardon, CRNA      . metoprolol tartrate (LOPRESSOR) tablet 25 mg  25 mg Oral BID Matthew Ramsey Eskridge, MD   25 mg at 03/23/13 2118  . ondansetron (ZOFRAN) injection 4 mg  4 mg Intravenous Q4H PRN Matthew Ramsey Eskridge, MD      . zolpidem (AMBIEN) tablet 5 mg  5 mg Oral QHS PRN Matthew Ramsey Eskridge, MD        Assessment: s/p Procedure(s): CYSTOSCOPY WITH RETROGRADE PYELOGRAM/URETERAL STENT PLACEMENT  He is clinically improving with a normalization of his leukocytosis and improved Cr, but he remains febrile.   Plan: Continue IV rocephin pending the culture. KUB today to assess stone position.  BMP in AM I may broaden his coverage if the temperature remains elevated.     LOS: 1 day    Dione Mccombie J 03/24/2013  

## 2013-03-31 NOTE — Op Note (Signed)
NAMEBALIN, VANDEGRIFT NO.:  [id number]  MEDICAL RECORD NO.:  [id number]  LOCATION:  [facility]                         FACILITY:  [facility]  PHYSICIAN:  Excell Seltzer. Annabell Howells, M.D.    DATE OF BIRTH:  28-Jun-1946  DATE OF PROCEDURE:  03/31/2013 DATE OF DISCHARGE:  03/31/2013                              OPERATIVE REPORT   PROCEDURE: 1. Cystoscopy with removal of left double-J stent. 2. Left ureteroscopy with holmium laser lithotripsy and stone     extraction.  PREOPERATIVE DIAGNOSIS:  Left proximal ureteral stone.  POSTOPERATIVE DIAGNOSIS:  Left proximal ureteral stone.  SURGEON:  Excell Seltzer. Annabell Howells, M.D.  ANESTHESIA:  General.  SPECIMEN:  Stone fragments.  DRAINS:  None.  COMPLICATIONS:  None.  INDICATIONS:  Mr. Cislo is a 67 year old with a left proximal ureteral stone originally presented with obstruction and sepsis.  He underwent stenting and has been on antibiotics.  He remains afebrile and is to undergo definitive ureteroscopic stone extraction.  FINDINGS OF PROCEDURE:  He was given vancomycin and gentamicin preoperatively because of his Staph found on his urine culture at initial presentation.  He was taken to the operating room where general anesthetic was induced.  He was fitted with a PAS hose and placed in lithotomy position.  His perineum and genitalia were prepped with Betadine solution and draped in usual sterile fashion.  Cystoscopy was performed using a 22-French scope and 12-degree lens. Examination revealed a normal urethra.  The external sphincter was intact.  The prostatic urethra was approximately 2 cm in length with bilobar hyperplasia without significant obstruction.  Examination of bladder revealed the stent at the left ureteral orifice with some parameatal edema.  The bladder wall was otherwise unremarkable.  The right ureteral orifice was unremarkable. The stent was grasped with grasping forceps and pulled the urethral meatus.  A guidewire was  passed to the kidney through the stent.  The stent was then removed.  A 6.4-French semirigid ureteroscope was then passed per urethra, was able to negotiate this up the ureter to the level of the stone.  The stone was then engaged with a 365 micron laser fiber set initially on 0.5 joules and 10 hertz, and eventually 1 joule and 5 hertz.  The stone fragmented readily at manageable fragments.  A Nitinol basket was then used to extract all fragments greater than 2 mm.  Some residual dust was left in the ureter post extraction, but these fragments were too small to retrieve and were felt to be sufficiently small to pass readily.  Final inspection of the ureter revealed no significant ureteral trauma or edema related to the procedure and it was felt that replacement of the stent was not indicated.  The ureteroscope was removed.  The cystoscope was reinserted over the wire.  The bladder was drained and the wire was removed.  B and O suppository was placed.  He was taken down from lithotomy position.  His anesthetic was reversed.  He was moved to recovery room in stable condition.  There were no complications.     Excell Seltzer. Annabell Howells, M.D.     JJW/MEDQ  D:  03/31/2013  T:  03/31/2013  Job:  [id number] 

## 2013-04-01 ENCOUNTER — Encounter (HOSPITAL_COMMUNITY): Payer: Self-pay | Admitting: Urology

## 2013-05-13 ENCOUNTER — Telehealth: Payer: Self-pay

## 2013-05-13 NOTE — Telephone Encounter (Signed)
Patient needs med refill CPE 06/2012  Medication List and allergies: done  [facility] for 90 day supply [facility] or local prescriptions  Immunizations due: admin flu vaccine upon arrival  A/P:  LAST: HM due: PSA: UTD Urology  CCS: Due Cancelled  X 2 due to other health related issues   DM: Due 06/2012 HTN: Due 06/2012 Lipids: Due 06/2012

## 2013-05-15 ENCOUNTER — Ambulatory Visit (INDEPENDENT_AMBULATORY_CARE_PROVIDER_SITE_OTHER): Payer: Medicare Other | Admitting: Internal Medicine

## 2013-05-15 ENCOUNTER — Encounter: Payer: Self-pay | Admitting: Internal Medicine

## 2013-05-15 VITALS — BP 132/85 | HR 64 | Temp 98.5°F | Ht 73.0 in | Wt 222.6 lb

## 2013-05-15 DIAGNOSIS — R7309 Other abnormal glucose: Secondary | ICD-10-CM

## 2013-05-15 DIAGNOSIS — I10 Essential (primary) hypertension: Secondary | ICD-10-CM

## 2013-05-15 DIAGNOSIS — D126 Benign neoplasm of colon, unspecified: Secondary | ICD-10-CM

## 2013-05-15 DIAGNOSIS — Z23 Encounter for immunization: Secondary | ICD-10-CM

## 2013-05-15 DIAGNOSIS — E782 Mixed hyperlipidemia: Secondary | ICD-10-CM

## 2013-05-15 LAB — HEPATIC FUNCTION PANEL
Albumin: 4.1 g/dL (ref 3.5–5.2)
Total Protein: 6.7 g/dL (ref 6.0–8.3)

## 2013-05-15 LAB — LIPID PANEL
HDL: 57 mg/dL (ref >39.00)
Triglycerides: 67 mg/dL (ref 0.0–149.0)

## 2013-05-15 NOTE — Assessment & Plan Note (Signed)
A1c

## 2013-05-15 NOTE — Progress Notes (Signed)
Subjective:    Patient ID: Richard [name], male    DOB: 09-29-1945, 67 y.o.   MRN: [id number]  HPI  He is here for reassessment of his dyslipidemia and hypertension and for medication refills.  He has had a complicated course with his renal calculi over past 24 months. This has required cystoscopy, lithotripsy as well as invasive percutaneous procedure for stone removal. In 8/14 he had stenting temporarily.This was complicated by renal insufficiency. His creatinine was 2.29, BUN 18, and GFR 28 on 03/23/13. Renal function has gradually improved. As of 9/2 BUN was 26, creatinine 1.24, and GFR 59.  Last lipids on record were 07/04/12; LDL was 74, HDL 58.4, and triglycerides 58. His glucoses have ranged from 110-124.   Review of Systems  He is on a modified heart healthy diet; he exercises as walking  30 minutes 6 times per week  without symptoms. Specifically he denies chest pain, palpitations, dyspnea, or claudication.  Family history is positive for  coronary disease in his father with MI @ 52. Advanced cholesterol testing reveals his LDL goal is less than 100. There is medication compliance with the statin. Significant abdominal symptoms, memory deficit, or myalgias denied. He denies significant headaches, epistaxis, or lightheadedness. BP not monitored. He denies polyuria, polyphagia, polydipsia. There's no blurred vision, double vision, or loss of vision. Ophthalmologic exam is current ;no retinopathy present. He also denies numbness, tingling, or burning in his extremities. He has no nonhealing skin lesions. Weight is down 20# with CVE & improved diet..       Objective:   Physical Exam Gen.: Healthy and well-nourished in appearance. Alert, appropriate and cooperative throughout exam.  Head: Normocephalic without obvious abnormalities; moustache & beard; no  alopecia  Eyes: No corneal or conjunctival inflammation noted. Pupils equal round reactive to light and accommodation.  Extraocular  motion intact.   Nose: External nasal exam reveals no deformity or inflammation. Nasal mucosa are pink and moist. No lesions or exudates noted.   Mouth: Oral mucosa and oropharynx reveal no lesions or exudates. Teeth in good repair. Neck: No deformities, masses, or tenderness noted. Range of motion & Thyroid normal. Lungs: Normal respiratory effort; chest expands symmetrically. Lungs are clear to auscultation without rales, wheezes, or increased work of breathing. Heart: Normal rate and rhythm. Normal S1 and S2. No gallop, click, or rub. S4 w/o murmur. Abdomen: Bowel sounds normal; abdomen soft and nontender. No masses, organomegaly or hernias noted. Genitalia: As per Dr Annabell Howells                                 Musculoskeletal/extremities: No deformity or scoliosis noted of  the thoracic or lumbar spine.   No clubbing, cyanosis, edema, or significant extremity  deformity noted. Range of motion normal .Tone & strength  Normal. Joints normal. Nail health good. Able to lie down & sit up w/o help. Negative SLR bilaterally Vascular: Carotid, radial artery, dorsalis pedis and  posterior tibial pulses are full and equal. No bruits present. Neurologic: Alert and oriented x3. Deep tendon reflexes symmetrical and normal.         Skin: Intact without suspicious lesions or rashes. Lymph: No cervical, axillary lymphadenopathy present. Psych: Mood and affect are normal. Normally interactive  Assessment & Plan:  See Current Assessment & Plan in Problem List under specific Diagnosis

## 2013-05-15 NOTE — Assessment & Plan Note (Signed)
fasting Labs : Lipids, hepatic panel,  TSH. 

## 2013-05-15 NOTE — Assessment & Plan Note (Addendum)
BMET 03/31/13 reviewed; BP goals discussed

## 2013-05-15 NOTE — Patient Instructions (Signed)
Minimal Blood Pressure Goal= AVERAGE < 140/90;  Ideal is an AVERAGE < 135/85. This AVERAGE should be calculated from @ least 5-7 BP readings taken @ different times of day on different days of week. You should not respond to isolated BP readings , but rather the AVERAGE for that week .Please bring your  blood pressure cuff to office visits to verify that it is reliable.It  can also be checked against the blood pressure device at the pharmacy. Finger or wrist cuffs are not dependable; an arm cuff is. Stay well hydrated. Drink to thirst up to 40 ounces of fluids daily.

## 2013-05-18 ENCOUNTER — Encounter: Payer: Self-pay | Admitting: *Deleted

## 2013-05-18 NOTE — Progress Notes (Signed)
Letter mailed to patient.

## 2013-07-13 ENCOUNTER — Other Ambulatory Visit: Payer: Self-pay | Admitting: Internal Medicine

## 2013-07-14 NOTE — Telephone Encounter (Signed)
Med filled.  

## 2013-07-21 ENCOUNTER — Other Ambulatory Visit: Payer: Self-pay | Admitting: Internal Medicine

## 2013-07-21 NOTE — Telephone Encounter (Signed)
Amlodipine refilled per protocol. JG//CMA 

## 2013-09-21 ENCOUNTER — Other Ambulatory Visit: Payer: Self-pay | Admitting: Vascular Surgery

## 2013-09-22 ENCOUNTER — Other Ambulatory Visit: Payer: 59

## 2013-09-22 ENCOUNTER — Ambulatory Visit: Payer: 59 | Admitting: Vascular Surgery

## 2014-01-01 ENCOUNTER — Other Ambulatory Visit: Payer: Self-pay | Admitting: Vascular Surgery

## 2014-01-01 LAB — BUN: BUN: 24 mg/dL — AB (ref 6–23)

## 2014-01-01 LAB — CREATININE, SERUM: Creat: 1.33 mg/dL (ref 0.50–1.35)

## 2014-01-04 ENCOUNTER — Encounter: Payer: Self-pay | Admitting: Vascular Surgery

## 2014-01-04 ENCOUNTER — Other Ambulatory Visit: Payer: Self-pay | Admitting: Vascular Surgery

## 2014-01-04 DIAGNOSIS — I723 Aneurysm of iliac artery: Secondary | ICD-10-CM

## 2014-01-05 ENCOUNTER — Ambulatory Visit
Admission: RE | Admit: 2014-01-05 | Discharge: 2014-01-05 | Disposition: A | Discharge status: Home / Self Care | Payer: Medicare Other | Source: Ambulatory Visit | Attending: Vascular Surgery | Admitting: Vascular Surgery

## 2014-01-05 ENCOUNTER — Encounter: Payer: Self-pay | Admitting: Vascular Surgery

## 2014-01-05 ENCOUNTER — Ambulatory Visit (INDEPENDENT_AMBULATORY_CARE_PROVIDER_SITE_OTHER): Payer: Medicare Other | Admitting: Vascular Surgery

## 2014-01-05 ENCOUNTER — Other Ambulatory Visit: Payer: Medicare Other

## 2014-01-05 VITALS — BP 142/92 | HR 56 | Ht 73.0 in | Wt 235.0 lb

## 2014-01-05 DIAGNOSIS — I723 Aneurysm of iliac artery: Secondary | ICD-10-CM

## 2014-01-05 DIAGNOSIS — I712 Thoracic aortic aneurysm, without rupture, unspecified: Secondary | ICD-10-CM | POA: Insufficient documentation

## 2014-01-05 DIAGNOSIS — I728 Aneurysm of other specified arteries: Secondary | ICD-10-CM

## 2014-01-05 MED ORDER — IOHEXOL 350 MG/ML SOLN
80.0000 mL | Freq: Once | INTRAVENOUS | Status: AC | PRN
  Administered 2014-01-05: 80 mL via INTRAVENOUS

## 2014-01-05 NOTE — Addendum Note (Signed)
Addended by: Mena Goes on: 01/05/2014 04:02 PM   Modules accepted: Orders

## 2014-01-05 NOTE — Progress Notes (Signed)
Subjective:     Patient ID: Richard [name], male   DOB: 1945/12/05, 68 y.o.   MRN: [id number]  HPI this 68 year old male returns for followup regarding his left common iliac artery aneurysm. He denies any abdominal or back symptoms in the past year. He has also had no chest pain, dyspnea on exertion, PND, orthopnea, or hemoptysis. He did have some surgery for kidney stone problems by Dr. Sandrea Matte.  Past Medical History  Diagnosis Date  . Squamous cell carcinoma, face     Dr Sarajane Jews  . Nephrolithiasis     left   . Hyperlipidemia     LDL goal = < 100 based on NMR Lipoproprofile  . Fasting hyperglycemia   . Hypertension   . Arthritis   . Aneurysm     left common iliac-stable  . Sepsis 03/24/2013  . Ureteral stone with hydronephrosis 03/24/2013    left  . Renal insufficiency     History  Substance Use Topics  . Smoking status: Never Smoker   . Smokeless tobacco: Never Used  . Alcohol Use: No     Comment:  rarely    Family History  Problem Relation Age of Onset  . Heart attack Father 18     CBAG X5 vessels  . Hyperlipidemia Father   . Hypertension Father   . Heart disease Father     before age 13  . Hypertension Brother   . Aortic aneurysm Paternal Uncle     AAA ruptured  . Heart disease Mother   . Hyperlipidemia Mother   . Hypertension Mother   . Diabetes Neg Hx     Allergies  Allergen Reactions  . Angiotensin Receptor Blockers     Angioedema with ACE-I  . Ciprofloxacin     Rash on combination of Cipro & Pyridium after cystoscopic & open resection of renal calculi  . Lisinopril     Swelling of uvula  . Pyridium [Phenazopyridine]     In combo with Cipro    Current outpatient prescriptions:amLODipine (NORVASC) 5 MG tablet, TAKE 1 TABLET BY MOUTH ONCE DAILY, Disp: 90 tablet, Rfl: 1;  aspirin 81 MG EC tablet, Take 81 mg by mouth daily. , Disp: , Rfl: ;  atorvastatin (LIPITOR) 20 MG tablet, TAKE 1 TABLET BY MOUTH ONCE DAILY ON MON, WED, FRI AND SUN ONLY, Disp: 90 tablet,  Rfl: 1;  Ibuprofen (ADVIL) 200 MG CAPS, Take 600-800 mg by mouth every 6 (six) hours as needed. For pain, Disp: , Rfl:  oxyCODONE-acetaminophen (PERCOCET/ROXICET) 5-325 MG per tablet, Take 1 tablet by mouth every 4 (four) hours as needed for pain., Disp: 15 tablet, Rfl: 0;  metoprolol tartrate (LOPRESSOR) 25 MG tablet, TAKE 1 TABLET BY MOUTH TWICE DAILY, Disp: 180 tablet, Rfl: 1  BP 142/92  Pulse 56  Ht 6\' 1"  (1.854 m)  Wt 235 lb (106.595 kg)  BMI 31.01 kg/m2  SpO2 100%  Body mass index is 31.01 kg/(m^2).           Review of Systems systems negative and  complete review of systems except that covered in present illness     Objective:   Physical Exam BP 142/92  Pulse 56  Ht 6\' 1"  (1.854 m)  Wt 235 lb (106.595 kg)  BMI 31.01 kg/m2  SpO2 100%  Gen.-alert and oriented x3 in no apparent distress HEENT normal for age Lungs no rhonchi or wheezing Cardiovascular regular rhythm no murmurs carotid pulses 3+ palpable no bruits audible Abdomen soft nontender no palpable masses  Musculoskeletal free of  major deformities Skin clear -no rashes Neurologic normal Lower extremities 3+ femoral and dorsalis pedis pulses palpable bilaterally with no edema  Today I ordered a CT angiogram of the chest and abdomen which are reviewed and interpreted. He has a 4.2 cm in diameter a descending aortic arch which is quite stable and has been noted in the past. The left common iliac artery aneurysm is 30.4 mm in maximum diameter very similar to last year study of the 2029 and 30 mm. 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       Assessment:     Stable left common iliac artery aneurysm measuring 30.4 mm in maximum diameter-essentially unchanged from 16 months ago Stable A. sending arch at 4.2 cm in maximum diameter    Plan:     Return in one year with CT angiogram of chest and abdomen for continued followup

## 2014-01-12 ENCOUNTER — Other Ambulatory Visit: Payer: Self-pay | Admitting: Internal Medicine

## 2014-01-18 ENCOUNTER — Other Ambulatory Visit: Payer: Self-pay | Admitting: Internal Medicine

## 2014-05-24 ENCOUNTER — Encounter: Payer: Self-pay | Admitting: Internal Medicine

## 2014-05-24 ENCOUNTER — Other Ambulatory Visit (INDEPENDENT_AMBULATORY_CARE_PROVIDER_SITE_OTHER): Payer: Medicare Other

## 2014-05-24 ENCOUNTER — Ambulatory Visit (INDEPENDENT_AMBULATORY_CARE_PROVIDER_SITE_OTHER): Payer: Medicare Other | Admitting: Internal Medicine

## 2014-05-24 ENCOUNTER — Other Ambulatory Visit: Payer: Self-pay | Admitting: Internal Medicine

## 2014-05-24 VITALS — BP 128/90 | HR 54 | Temp 97.8°F | Resp 13 | Wt 223.0 lb

## 2014-05-24 DIAGNOSIS — I10 Essential (primary) hypertension: Secondary | ICD-10-CM

## 2014-05-24 DIAGNOSIS — R7301 Impaired fasting glucose: Secondary | ICD-10-CM

## 2014-05-24 DIAGNOSIS — E782 Mixed hyperlipidemia: Secondary | ICD-10-CM

## 2014-05-24 DIAGNOSIS — Z86010 Personal history of colonic polyps: Secondary | ICD-10-CM

## 2014-05-24 LAB — CBC WITH DIFFERENTIAL/PLATELET
BASOS PCT: 0.3 % (ref 0.0–3.0)
Basophils Absolute: 0 10*3/uL (ref 0.0–0.1)
EOS ABS: 0.2 10*3/uL (ref 0.0–0.7)
EOS PCT: 1.9 % (ref 0.0–5.0)
HCT: 48.4 % (ref 39.0–52.0)
HEMOGLOBIN: 15.8 g/dL (ref 13.0–17.0)
LYMPHS PCT: 26.8 % (ref 12.0–46.0)
Lymphs Abs: 2.1 10*3/uL (ref 0.7–4.0)
MCHC: 32.7 g/dL (ref 30.0–36.0)
MCV: 80.6 fl (ref 78.0–100.0)
MONOS PCT: 8.2 % (ref 3.0–12.0)
Monocytes Absolute: 0.7 10*3/uL (ref 0.1–1.0)
NEUTROS ABS: 5 10*3/uL (ref 1.4–7.7)
NEUTROS PCT: 62.8 % (ref 43.0–77.0)
Platelets: 223 10*3/uL (ref 150.0–400.0)
RBC: 6 Mil/uL — AB (ref 4.22–5.81)
RDW: 15.3 % (ref 11.5–15.5)
WBC: 8 10*3/uL (ref 4.0–10.5)

## 2014-05-24 LAB — BASIC METABOLIC PANEL
BUN: 24 mg/dL — ABNORMAL HIGH (ref 6–23)
CHLORIDE: 103 meq/L (ref 96–112)
CO2: 23 mEq/L (ref 19–32)
Calcium: 9.3 mg/dL (ref 8.4–10.5)
Creatinine, Ser: 1.5 mg/dL (ref 0.4–1.5)
GFR: 48.21 mL/min — ABNORMAL LOW (ref >60.00)
GLUCOSE: 95 mg/dL (ref 70–99)
POTASSIUM: 4.8 meq/L (ref 3.5–5.1)
Sodium: 141 mEq/L (ref 135–145)

## 2014-05-24 LAB — HEPATIC FUNCTION PANEL
ALT: 16 U/L (ref 0–53)
AST: 19 U/L (ref 0–37)
Albumin: 3.7 g/dL (ref 3.5–5.2)
Alkaline Phosphatase: 55 U/L (ref 39–117)
BILIRUBIN TOTAL: 0.9 mg/dL (ref 0.2–1.2)
Bilirubin, Direct: 0.2 mg/dL (ref 0.0–0.3)
Total Protein: 6.8 g/dL (ref 6.0–8.3)

## 2014-05-24 LAB — HEMOGLOBIN A1C: HEMOGLOBIN A1C: 5.9 % (ref 4.6–6.5)

## 2014-05-24 MED ORDER — AMLODIPINE BESYLATE 5 MG PO TABS: ORAL_TABLET | ORAL | Status: DC

## 2014-05-24 MED ORDER — METOPROLOL TARTRATE 25 MG PO TABS: ORAL_TABLET | ORAL | Status: DC

## 2014-05-24 MED ORDER — ATORVASTATIN CALCIUM 20 MG PO TABS: ORAL_TABLET | ORAL | Status: DC

## 2014-05-24 MED ORDER — SILDENAFIL CITRATE 20 MG PO TABS: ORAL_TABLET | ORAL | Status: DC

## 2014-05-24 NOTE — Progress Notes (Signed)
   Subjective:    Patient ID: Richard [name], male    DOB: 1946/01/15, 68 y.o.   MRN: [id number]  HPI   He is here to assess active health issues & conditions. PMH, FH, & Social history verified & updated   He has been compliant with his medications without adverse effect  He does not monitor blood pressure at home.  He as been on a modified heart healthy diet. He does restrict sodium  He's walking 30 minutes daily as well as lifting weights every other day w/o cardiopulmonary symptoms.  Ophthalmologic exam is pending next week   He is requesting ColoGuard in lieu of colonoscopy he had 2 adenomatous polyps in 2007. Followup was deferred because his iliac artery aneurysm & subsequently because of his wife's health issues.He has no GI symptoms.  Family history includes resection of a tumor from the abdomen in his brother. He doess not know the pathology.    Review of Systems   Chest pain, palpitations, tachycardia, exertional dyspnea, paroxysmal nocturnal dyspnea, claudication or edema are absent.  He recently has had pain in the left heel in the morning. Arch supports have not been of partial benefit. Stretching does help. It does resolve once he is up and mobile. He questions plantar fasciitis.  Unexplained weight loss, abdominal pain, significant dyspepsia, dysphagia, melena, rectal bleeding, or persistently small caliber stools are denied.   His wife has had bilateral mastectomies & apparently has expressed to him she feels she is not attractive to him. This has affected erectile function. He has never had to take medication for ED but requests prn  med trial.    Objective:   Physical Exam   Positive pertinent findings include: He has bilateral ptosis; left greater than right. He has a beard, mustache, and pony tail.Hair is graying diffusely. The toenails were thickened, particularly right great toenail greater than left. There is tenderness to percussion over the left  posterior heel. There is no tenderness to palpation of the Achilles tendon.  Appears healthy and well-nourished & in no acute distress No carotid bruits are present.No neck vein distention present at 10 - 15 degrees. Thyroid normal to palpation Heart rhythm and rate are normal with no gallop or murmur Chest is clear with no increased work of breathing There is no evidence of aortic aneurysm or renal artery bruits Abdomen soft with no organomegaly or masses. No HJR No clubbing, cyanosis or edema present. Pedal pulses are intact  No ischemic skin changes are present . Fingernails healthy  Alert and oriented. Strength, tone, DTRs reflexes equal & 1/2+          Assessment & Plan:  #1See Current Assessment & Plan in Problem List under specific Diagnosis #2 plantar fascitis #3 ED  See orders & AVS

## 2014-05-24 NOTE — Assessment & Plan Note (Signed)
A1c

## 2014-05-24 NOTE — Patient Instructions (Addendum)
Your next office appointment will be determined based upon review of your pending labs . Those instructions will be transmitted to you through My Chart   Roll the affected foot over a tennis ball 20 times twice a day. After this soak the foot in warm Epsom salts for 15-20 minutes. Wear arch supports in both shoes. Podiatry referral if symptoms persist. Consider glucosamine sulfate 1500 mg daily for the fasciitis for 2-4 weeks ;this will rehydrate the cartilages.   ColoGuard may be an option in place of colonoscopy; you should explore the cost of this testing with your insurance company. If that test is positive; colonoscopy is mandatory. Medicare Part B covers this ONCE every 3 years for patient's meet the following conditions: #1 Ages 30-85 #2 No signs or symptoms of colorectal disease including but not limited to lower gastrointestinal pain; blood in the stool; positive stool cards; or abnormal immunochemical stool studies. #3 Patient must be of average risk of developing colorectal cancer and have NO personal history of adenomatous polyps, colorectal cancer or inflammatory bowel disease such as Crohn's disease and ulcerative colitis. There also may be no family history of colorectal cancers or adenomatous polyps, familial adenomatous polyposis or hereditary non-polyposis colorectal cancer.  Minimal Blood Pressure Goal= AVERAGE < 140/90;  Ideal is an AVERAGE < 135/85. This AVERAGE should be calculated from @ least 5-7 BP readings taken @ different times of day on different days of week. You should not respond to isolated BP readings , but rather the AVERAGE for that week .Please bring your  blood pressure cuff to office visits to verify that it is reliable.It  can also be checked against the blood pressure device at the pharmacy. Finger or wrist cuffs are not dependable; an arm cuff is.

## 2014-05-24 NOTE — Assessment & Plan Note (Signed)
Blood pressure goals reviewed. BMET 

## 2014-05-24 NOTE — Progress Notes (Signed)
Pre visit review using our clinic review tool, if applicable. No additional management support is needed unless otherwise documented below in the visit note. 

## 2014-05-24 NOTE — Assessment & Plan Note (Signed)
NMR Lipoprofile, hepatic panel,TSH 

## 2014-05-25 ENCOUNTER — Telehealth: Payer: Self-pay | Admitting: Internal Medicine

## 2014-05-25 LAB — TSH: TSH: 2.29 u[IU]/mL (ref 0.35–4.50)

## 2014-05-25 NOTE — Telephone Encounter (Signed)
emmi mailed  °

## 2014-05-25 NOTE — Assessment & Plan Note (Addendum)
Colo Guard appears to be contraindicated because of PMH of polyps PMH of iliac aneurysm problematic in reference to repeat colonoscopy I shall review with Dr Carlean Purl

## 2014-05-26 LAB — NMR LIPOPROFILE WITH LIPIDS
CHOLESTEROL, TOTAL: 158 mg/dL (ref 100–199)
HDL PARTICLE NUMBER: 33.1 umol/L (ref >=30.5)
HDL Size: 9.1 nm — ABNORMAL LOW (ref >=9.2)
HDL-C: 63 mg/dL (ref >39)
LARGE HDL: 6.8 umol/L (ref >=4.8)
LDL (calc): 79 mg/dL (ref 0–99)
LDL Particle Number: 1137 nmol/L — ABNORMAL HIGH (ref <1000)
LDL Size: 20.8 nm (ref >=20.8)
LP-IR Score: 25 (ref <=45)
Small LDL Particle Number: 447 nmol/L (ref <=527)
Triglycerides: 78 mg/dL (ref 0–149)
VLDL Size: 42.5 nm (ref <=46.6)

## 2014-09-08 ENCOUNTER — Encounter: Payer: Self-pay | Admitting: Cardiology

## 2015-01-07 ENCOUNTER — Encounter: Payer: Self-pay | Admitting: Vascular Surgery

## 2015-01-11 ENCOUNTER — Ambulatory Visit: Payer: Medicare Other | Admitting: Vascular Surgery

## 2015-01-11 ENCOUNTER — Other Ambulatory Visit: Payer: Medicare Other

## 2015-01-24 ENCOUNTER — Other Ambulatory Visit: Payer: Self-pay

## 2015-07-04 ENCOUNTER — Other Ambulatory Visit (INDEPENDENT_AMBULATORY_CARE_PROVIDER_SITE_OTHER): Payer: Medicare Other

## 2015-07-04 ENCOUNTER — Ambulatory Visit (INDEPENDENT_AMBULATORY_CARE_PROVIDER_SITE_OTHER): Payer: Medicare Other | Admitting: Family

## 2015-07-04 ENCOUNTER — Encounter: Payer: Self-pay | Admitting: Family

## 2015-07-04 ENCOUNTER — Telehealth: Payer: Self-pay | Admitting: Family

## 2015-07-04 VITALS — BP 118/82 | HR 51 | Temp 98.0°F | Resp 18 | Ht 73.0 in | Wt 222.0 lb

## 2015-07-04 DIAGNOSIS — E785 Hyperlipidemia, unspecified: Secondary | ICD-10-CM

## 2015-07-04 DIAGNOSIS — I10 Essential (primary) hypertension: Secondary | ICD-10-CM

## 2015-07-04 DIAGNOSIS — Z125 Encounter for screening for malignant neoplasm of prostate: Secondary | ICD-10-CM

## 2015-07-04 DIAGNOSIS — Z0000 Encounter for general adult medical examination without abnormal findings: Secondary | ICD-10-CM

## 2015-07-04 DIAGNOSIS — Z23 Encounter for immunization: Secondary | ICD-10-CM | POA: Diagnosis not present

## 2015-07-04 LAB — LIPID PANEL
CHOLESTEROL: 173 mg/dL (ref 0–200)
HDL: 60.7 mg/dL (ref >39.00)
LDL CALC: 96 mg/dL (ref 0–99)
NonHDL: 111.86
Total CHOL/HDL Ratio: 3
Triglycerides: 77 mg/dL (ref 0.0–149.0)
VLDL: 15.4 mg/dL (ref 0.0–40.0)

## 2015-07-04 LAB — COMPREHENSIVE METABOLIC PANEL
ALT: 15 U/L (ref 0–53)
AST: 16 U/L (ref 0–37)
Albumin: 4.4 g/dL (ref 3.5–5.2)
Alkaline Phosphatase: 62 U/L (ref 39–117)
BUN: 21 mg/dL (ref 6–23)
CO2: 30 mEq/L (ref 19–32)
Calcium: 9.7 mg/dL (ref 8.4–10.5)
Chloride: 103 mEq/L (ref 96–112)
Creatinine, Ser: 1.45 mg/dL (ref 0.40–1.50)
GFR: 51.5 mL/min — ABNORMAL LOW (ref >60.00)
Glucose, Bld: 105 mg/dL — ABNORMAL HIGH (ref 70–99)
Potassium: 5.1 mEq/L (ref 3.5–5.1)
Sodium: 141 mEq/L (ref 135–145)
Total Bilirubin: 0.8 mg/dL (ref 0.2–1.2)
Total Protein: 6.7 g/dL (ref 6.0–8.3)

## 2015-07-04 LAB — CBC
HCT: 51.3 % (ref 39.0–52.0)
Hemoglobin: 16.6 g/dL (ref 13.0–17.0)
MCHC: 32.4 g/dL (ref 30.0–36.0)
MCV: 82.2 fl (ref 78.0–100.0)
PLATELETS: 235 10*3/uL (ref 150.0–400.0)
RBC: 6.24 Mil/uL — ABNORMAL HIGH (ref 4.22–5.81)
RDW: 14.6 % (ref 11.5–15.5)
WBC: 10.3 10*3/uL (ref 4.0–10.5)

## 2015-07-04 LAB — PSA: PSA: 1.3 ng/mL (ref 0.10–4.00)

## 2015-07-04 LAB — TSH: TSH: 4.14 u[IU]/mL (ref 0.35–4.50)

## 2015-07-04 NOTE — Patient Instructions (Addendum)
Thank you for choosing  HealthCare.  Summary/Instructions:  Please stop by the lab on the basement level of the building for your blood work. Your results will be released to MyChart (or called to you) after review, usually within 72 hours after test completion. If any changes need to be made, you will be notified at that same time.  If your symptoms worsen or fail to improve, please contact our office for further instruction, or in case of emergency go directly to the emergency room at the closest medical facility.    Health Maintenance, Male A healthy lifestyle and preventative care can promote health and wellness.  Maintain regular health, dental, and eye exams.  Eat a healthy diet. Foods like vegetables, fruits, whole grains, low-fat dairy products, and lean protein foods contain the nutrients you need and are low in calories. Decrease your intake of foods high in solid fats, added sugars, and salt. Get information about a proper diet from your health care provider, if necessary.  Regular physical exercise is one of the most important things you can do for your health. Most adults should get at least 150 minutes of moderate-intensity exercise (any activity that increases your heart rate and causes you to sweat) each week. In addition, most adults need muscle-strengthening exercises on 2 or more days a week.   Maintain a healthy weight. The body mass index (BMI) is a screening tool to identify possible weight problems. It provides an estimate of body fat based on height and weight. Your health care provider can find your BMI and can help you achieve or maintain a healthy weight. For males 20 years and older:  A BMI below 18.5 is considered underweight.  A BMI of 18.5 to 24.9 is normal.  A BMI of 25 to 29.9 is considered overweight.  A BMI of 30 and above is considered obese.  Maintain normal blood lipids and cholesterol by exercising and minimizing your intake of saturated fat.  Eat a balanced diet with plenty of fruits and vegetables. Blood tests for lipids and cholesterol should begin at age 20 and be repeated every 5 years. If your lipid or cholesterol levels are high, you are over age 50, or you are at high risk for heart disease, you may need your cholesterol levels checked more frequently.Ongoing high lipid and cholesterol levels should be treated with medicines if diet and exercise are not working.  If you smoke, find out from your health care provider how to quit. If you do not use tobacco, do not start.  Lung cancer screening is recommended for adults aged 55-80 years who are at high risk for developing lung cancer because of a history of smoking. A yearly low-dose CT scan of the lungs is recommended for people who have at least a 30-pack-year history of smoking and are current smokers or have quit within the past 15 years. A pack year of smoking is smoking an average of 1 pack of cigarettes a day for 1 year (for example, a 30-pack-year history of smoking could mean smoking 1 pack a day for 30 years or 2 packs a day for 15 years). Yearly screening should continue until the smoker has stopped smoking for at least 15 years. Yearly screening should be stopped for people who develop a health problem that would prevent them from having lung cancer treatment.  If you choose to drink alcohol, do not have more than 2 drinks per day. One drink is considered to be 12 oz (360 mL)   of beer, 5 oz (150 mL) of wine, or 1.5 oz (45 mL) of liquor.  Avoid the use of street drugs. Do not share needles with anyone. Ask for help if you need support or instructions about stopping the use of drugs.  High blood pressure causes heart disease and increases the risk of stroke. High blood pressure is more likely to develop in:  People who have blood pressure in the end of the normal range (100-139/85-89 mm Hg).  People who are overweight or obese.  People who are African American.  If you are  18-39 years of age, have your blood pressure checked every 3-5 years. If you are 40 years of age or older, have your blood pressure checked every year. You should have your blood pressure measured twice--once when you are at a hospital or clinic, and once when you are not at a hospital or clinic. Record the average of the two measurements. To check your blood pressure when you are not at a hospital or clinic, you can use:  An automated blood pressure machine at a pharmacy.  A home blood pressure monitor.  If you are 45-79 years old, ask your health care provider if you should take aspirin to prevent heart disease.  Diabetes screening involves taking a blood sample to check your fasting blood sugar level. This should be done once every 3 years after age 45 if you are at a normal weight and without risk factors for diabetes. Testing should be considered at a younger age or be carried out more frequently if you are overweight and have at least 1 risk factor for diabetes.  Colorectal cancer can be detected and often prevented. Most routine colorectal cancer screening begins at the age of 50 and continues through age 75. However, your health care provider may recommend screening at an earlier age if you have risk factors for colon cancer. On a yearly basis, your health care provider may provide home test kits to check for hidden blood in the stool. A small camera at the end of a tube may be used to directly examine the colon (sigmoidoscopy or colonoscopy) to detect the earliest forms of colorectal cancer. Talk to your health care provider about this at age 50 when routine screening begins. A direct exam of the colon should be repeated every 5-10 years through age 75, unless early forms of precancerous polyps or small growths are found.  People who are at an increased risk for hepatitis B should be screened for this virus. You are considered at high risk for hepatitis B if:  You were born in a country where  hepatitis B occurs often. Talk with your health care provider about which countries are considered high risk.  Your parents were born in a high-risk country and you have not received a shot to protect against hepatitis B (hepatitis B vaccine).  You have HIV or AIDS.  You use needles to inject street drugs.  You live with, or have sex with, someone who has hepatitis B.  You are a man who has sex with other men (MSM).  You get hemodialysis treatment.  You take certain medicines for conditions like cancer, organ transplantation, and autoimmune conditions.  Hepatitis C blood testing is recommended for all people born from 1945 through 1965 and any individual with known risk factors for hepatitis C.  Healthy men should no longer receive prostate-specific antigen (PSA) blood tests as part of routine cancer screening. Talk to your health care provider about   prostate cancer screening.  Testicular cancer screening is not recommended for adolescents or adult males who have no symptoms. Screening includes self-exam, a health care provider exam, and other screening tests. Consult with your health care provider about any symptoms you have or any concerns you have about testicular cancer.  Practice safe sex. Use condoms and avoid high-risk sexual practices to reduce the spread of sexually transmitted infections (STIs).  You should be screened for STIs, including gonorrhea and chlamydia if:  You are sexually active and are younger than 24 years.  You are older than 24 years, and your health care provider tells you that you are at risk for this type of infection.  Your sexual activity has changed since you were last screened, and you are at an increased risk for chlamydia or gonorrhea. Ask your health care provider if you are at risk.  If you are at risk of being infected with HIV, it is recommended that you take a prescription medicine daily to prevent HIV infection. This is called pre-exposure  prophylaxis (PrEP). You are considered at risk if:  You are a man who has sex with other men (MSM).  You are a heterosexual man who is sexually active with multiple partners.  You take drugs by injection.  You are sexually active with a partner who has HIV.  Talk with your health care provider about whether you are at high risk of being infected with HIV. If you choose to begin PrEP, you should first be tested for HIV. You should then be tested every 3 months for as long as you are taking PrEP.  Use sunscreen. Apply sunscreen liberally and repeatedly throughout the day. You should seek shade when your shadow is shorter than you. Protect yourself by wearing long sleeves, pants, a wide-brimmed hat, and sunglasses year round whenever you are outdoors.  Tell your health care provider of new moles or changes in moles, especially if there is a change in shape or color. Also, tell your health care provider if a mole is larger than the size of a pencil eraser.  A one-time screening for abdominal aortic aneurysm (AAA) and surgical repair of large AAAs by ultrasound is recommended for men aged 80-75 years who are current or former smokers.  Stay current with your vaccines (immunizations).   This information is not intended to replace advice given to you by your health care provider. Make sure you discuss any questions you have with your health care provider.   Document Released: 01/12/2008 Document Revised: 08/06/2014 Document Reviewed: 12/11/2010 Elsevier Interactive Patient Education 2016 [place] Maintenance  Topic Date Due  . Hepatitis C Screening  [birth year]  . TETANUS/TDAP  12/03/1966  . COLONOSCOPY  12/02/1997  . ZOSTAVAX  12/03/2007  . PNA vac Low Risk Adult (1 of 2 - PCV13) 12/02/2012  . INFLUENZA VACCINE  02/28/2015

## 2015-07-04 NOTE — Progress Notes (Signed)
Pre visit review using our clinic review tool, if applicable. No additional management support is needed unless otherwise documented below in the visit note. 

## 2015-07-04 NOTE — Telephone Encounter (Signed)
Please inform patient that his blood work shows that his kidney function, liver function, electrolytes, thyroid function, white/red blood cells and cholesterol are within the normal limits. Therefore no further action is required at this time and he can plan to follow up in 1 year.   NOTE: Please send results via mail per patient request. Thank you.

## 2015-07-04 NOTE — Assessment & Plan Note (Signed)
1) Anticipatory Guidance: Discussed importance of wearing a seatbelt while driving and not texting while driving; changing batteries in smoke detector at least once annually; wearing suntan lotion when outside; eating a balanced and moderate diet; getting physical activity at least 30 minutes per day.  2) Immunizations / Screenings / Labs:  Tetanus and influenza update today. Declined pneumonia and shingles. All other immunizations are up to date per recommendations. Due for a dental screen which which will be scheduled independently. Due for prostate screening - PSA ordered. Hepatitis screen ordered. Due for colonoscopy - patient wishes to wait at this time. All other screenings are up to date per recommendations.   Overall well exam with risk factors for cardiovascular disease including hypertension and hyperlipidemia. Currently maintained on medications for both conditions appear to be well controlled with no side effects. Recommend increasing number of meals per day to 2-3 that are moderate, varied, and focused on nutrient dense foods and low in saturated/transfer fats. Continue physical activity. Follow-up prevention exam in 1 year. Follow-up office visit pending blood work as needed.  Obtain CBC, BMET, Lipid profile and TSH.

## 2015-07-04 NOTE — Assessment & Plan Note (Signed)
Reviewed and updated patient's medical, surgical, family and social history. Medications and allergies were also reviewed. Basic screenings for depression, activities of daily living, hearing, cognition and safety were performed. Provider list was updated and health plan was provided to the patient.  

## 2015-07-04 NOTE — Progress Notes (Signed)
Subjective:    Patient ID: Richard [name], male    DOB: [birth year], 69 y.o.   MRN: [id number]  Chief Complaint  Patient presents with  . CPE    fasting    HPI:  Richard [name] is a 69 y.o. male who presents today for an annual wellness visit.   1) Health Maintenance -   Diet - Averages about 1 meal per day consisting of nuts, meat, fruits and vegetables. Caffeine intake of about 1-2 cups per day.   Exercise - Resistance training every other day.   2) Preventative Exams / Immunizations:  Dental -- Due for exam  Vision -- Up to date   Health Maintenance  Topic Date Due  . Hepatitis C Screening  1946-03-26  . TETANUS/TDAP  12/03/1966  . COLONOSCOPY  12/02/1997  . ZOSTAVAX  12/03/2007  . PNA vac Low Risk Adult (1 of 2 - PCV13) 12/02/2012  . INFLUENZA VACCINE  02/28/2015    Immunization History  Administered Date(s) Administered  . Influenza Whole 04/29/2012  . Influenza, High Dose Seasonal PF 05/15/2013  . Influenza,inj,Quad PF,36+ Mos 07/04/2015  . Tdap 07/04/2015     RISK FACTORS  Tobacco History  Smoking status  . Never Smoker   Smokeless tobacco  . Never Used     Cardiac risk factors: advanced age (older than 68 for men, 51 for women), dyslipidemia, hypertension, male gender and obesity (BMI >= 30 kg/m2).  Depression Screen  Q1: Over the past two weeks, have you felt down, depressed or hopeless? No  Q2: Over the past two weeks, have you felt little interest or pleasure in doing things? No  Have you lost interest or pleasure in daily life? No  Do you often feel hopeless? No  Do you cry easily over simple problems? No   Activities of Daily Living In your present state of health, do you have any difficulty performing the following activities?:  Driving? No Managing money?  No Feeding yourself? No Getting from bed to chair? No Climbing a flight of stairs? No Preparing food and eating?: No Bathing or showering? No Getting dressed: No Getting  to the toilet? No Using the toilet: No Moving around from place to place: No In the past year have you fallen or had a near fall?:No   Home Safety Has smoke detector and wears seat belts. No excess sun exposure. Are there smokers in your home (other than you)?  No Do you feel safe at home?  Yes  Hearing Difficulties: No Do you often ask people to speak up or repeat themselves? No Do you experience ringing or noises in your ears? No  Do you have difficulty understanding soft or whispered voices? No    Cognitive Testing  Alert? Yes   Normal Appearance? Yes  Oriented to person? Yes  Place? Yes   Time? Yes  Recall of three objects?  Yes  Can perform simple calculations? Yes  Displays appropriate judgment? Yes  Can read the correct time from a watch face? Yes  Do you feel that you have a problem with memory? No  Do you often misplace items? No   Advanced Directives have been discussed with the patient? Yes  Current Physicians/Providers and Suppliers  1. Terri Piedra, FNP - Internal Medicine 2. Unice Cobble, MD - Internal Medicine  Indicate any recent Medical Services you may have received from other than [facility] providers in the past year (date may be approximate).  All answers were reviewed with the  patient and necessary referrals were made:  Mauricio Po, [place]   07/04/2015    Review of Systems  Constitutional: Denies fever, chills, fatigue, or significant weight gain/loss. HENT: Head: Denies headache or neck pain Ears: Denies changes in hearing, ringing in ears, earache, drainage Nose: Denies discharge, stuffiness, itching, nosebleed, sinus pain Throat: Denies sore throat, hoarseness, dry mouth, sores, thrush Eyes: Denies loss/changes in vision, pain, redness, blurry/double vision, flashing lights Cardiovascular: Denies chest pain/discomfort, tightness, palpitations, shortness of breath with activity, difficulty lying down, swelling, sudden awakening with shortness of  breath Respiratory: Denies shortness of breath, cough, sputum production, wheezing Gastrointestinal: Denies dysphasia, heartburn, change in appetite, nausea, change in bowel habits, rectal bleeding, constipation, diarrhea, yellow skin or eyes Genitourinary: Denies frequency, urgency, burning/pain, blood in urine, incontinence, change in urinary strength. Musculoskeletal: Denies muscle/joint pain, stiffness, back pain, redness or swelling of joints, trauma Skin: Denies rashes, lumps, itching, dryness, color changes, or hair/nail changes Neurological: Denies dizziness, fainting, seizures, weakness, numbness, tingling, tremor Psychiatric - Denies nervousness, stress, depression or memory loss Endocrine: Denies heat or cold intolerance, sweating, frequent urination, excessive thirst, changes in appetite Hematologic: Denies ease of bruising or bleeding    Objective:     BP 118/82 mmHg  Pulse 51  Temp(Src) 98 F (36.7 C) (Oral)  Resp 18  Ht 6\' 1"  (1.854 m)  Wt 239 lb (108.41 kg)  BMI 31.54 kg/m2  SpO2 98% Nursing note and vital signs reviewed.  Physical Exam  Constitutional: He is oriented to person, place, and time. He appears well-developed and well-nourished.  HENT:  Head: Normocephalic.  Right Ear: Hearing, tympanic membrane, external ear and ear canal normal.  Left Ear: Hearing, tympanic membrane, external ear and ear canal normal.  Nose: Nose normal.  Mouth/Throat: Uvula is midline, oropharynx is clear and moist and mucous membranes are normal.  Eyes: Conjunctivae and EOM are normal. Pupils are equal, round, and reactive to light.  Neck: Neck supple. No JVD present. No tracheal deviation present. No thyromegaly present.  Cardiovascular: Normal rate, regular rhythm, normal heart sounds and intact distal pulses.   Pulmonary/Chest: Effort normal and breath sounds normal.  Abdominal: Soft. Bowel sounds are normal. He exhibits no distension and no mass. There is no tenderness. There  is no rebound and no guarding.  Musculoskeletal: Normal range of motion. He exhibits no edema or tenderness.  Lymphadenopathy:    He has no cervical adenopathy.  Neurological: He is alert and oriented to person, place, and time. He has normal reflexes. No cranial nerve deficit. He exhibits normal muscle tone. Coordination normal.  Skin: Skin is warm and dry.  Psychiatric: He has a normal mood and affect. His behavior is normal. Judgment and thought content normal.       Assessment & Plan:   During the course of the visit the patient was educated and counseled about appropriate screening and preventive services including:    Pneumococcal vaccine   Influenza vaccine  Td vaccine  Prostate cancer screening  Colorectal cancer screening  Diabetes screening  Nutrition counseling   Diet review for nutrition referral? Yes ____  Not Indicated _X___   Patient Instructions (the written plan) was given to the patient.  Medicare Attestation I have personally reviewed: The patient's medical and social history Their use of alcohol, tobacco or illicit drugs Their current medications and supplements The patient's functional ability including ADLs,fall risks, home safety risks, cognitive, and hearing and visual impairment Diet and physical activities Evidence for depression or mood disorders  The patient's weight, height, BMI,  have been recorded in the chart.  I have made referrals, counseling, and provided education to the patient based on review of the above and I have provided the patient with a written personalized care plan for preventive services.     Mauricio Po, FNP   07/04/2015    Problem List Items Addressed This Visit      Other   Medicare annual wellness visit, subsequent    Reviewed and updated patient's medical, surgical, family and social history. Medications and allergies were also reviewed. Basic screenings for depression, activities of daily living, hearing,  cognition and safety were performed. Provider list was updated and health plan was provided to the patient.       Routine general medical examination at a health care facility    1) Anticipatory Guidance: Discussed importance of wearing a seatbelt while driving and not texting while driving; changing batteries in smoke detector at least once annually; wearing suntan lotion when outside; eating a balanced and moderate diet; getting physical activity at least 30 minutes per day.  2) Immunizations / Screenings / Labs:  Tetanus and influenza update today. Declined pneumonia and shingles. All other immunizations are up to date per recommendations. Due for a dental screen which which will be scheduled independently. Due for prostate screening - PSA ordered. Hepatitis screen ordered. Due for colonoscopy - patient wishes to wait at this time. All other screenings are up to date per recommendations.   Overall well exam with risk factors for cardiovascular disease including hypertension and hyperlipidemia. Currently maintained on medications for both conditions appear to be well controlled with no side effects. Recommend increasing number of meals per day to 2-3 that are moderate, varied, and focused on nutrient dense foods and low in saturated/transfer fats. Continue physical activity. Follow-up prevention exam in 1 year. Follow-up office visit pending blood work as needed.  Obtain CBC, BMET, Lipid profile and TSH.        Relevant Orders   CBC   Comprehensive metabolic panel   PSA   TSH   Lipid panel   Hepatitis C antibody    Other Visit Diagnoses    Encounter for immunization    -  Primary    Need for diphtheria-tetanus-pertussis (Tdap) vaccine, adult/adolescent        Relevant Orders    Tdap vaccine greater than or equal to 7yo IM (Completed)

## 2015-07-07 ENCOUNTER — Encounter: Payer: Self-pay | Admitting: Family

## 2015-07-07 ENCOUNTER — Telehealth: Payer: Self-pay

## 2015-07-07 NOTE — Telephone Encounter (Signed)
Pt. called to report a "knot" in (L) groin that has developed in the past 5-7 days.  Denied any tenderness, pressure, redness, or inflammation.  Described the raised knot as being approx. 1" in diam., and "hard."  Reported he does lift weights, and also does a lot of heavy physical labor. Stated the knot has remained the same size since he found it.  Advised to have his PCP evaluate the knot.  Pt. Verb. Understanding.

## 2015-07-07 NOTE — Telephone Encounter (Signed)
Pt aware of results 

## 2015-07-07 NOTE — Telephone Encounter (Signed)
Results have been mailed

## 2015-07-09 ENCOUNTER — Other Ambulatory Visit: Payer: Self-pay | Admitting: Internal Medicine

## 2015-07-11 ENCOUNTER — Other Ambulatory Visit: Payer: Self-pay | Admitting: Emergency Medicine

## 2015-07-11 DIAGNOSIS — I10 Essential (primary) hypertension: Secondary | ICD-10-CM

## 2015-07-11 MED ORDER — AMLODIPINE BESYLATE 5 MG PO TABS: ORAL_TABLET | ORAL | Status: DC

## 2015-07-11 MED ORDER — METOPROLOL TARTRATE 25 MG PO TABS: ORAL_TABLET | ORAL | Status: DC

## 2015-07-16 ENCOUNTER — Other Ambulatory Visit: Payer: Self-pay | Admitting: Family

## 2015-12-12 ENCOUNTER — Other Ambulatory Visit: Payer: Self-pay | Admitting: Family

## 2016-01-05 ENCOUNTER — Other Ambulatory Visit: Payer: Self-pay | Admitting: Family

## 2016-05-24 ENCOUNTER — Encounter: Payer: Self-pay | Admitting: Family

## 2016-05-24 ENCOUNTER — Other Ambulatory Visit (INDEPENDENT_AMBULATORY_CARE_PROVIDER_SITE_OTHER): Payer: Medicare Other

## 2016-05-24 ENCOUNTER — Other Ambulatory Visit: Payer: Self-pay | Admitting: Family

## 2016-05-24 ENCOUNTER — Ambulatory Visit (INDEPENDENT_AMBULATORY_CARE_PROVIDER_SITE_OTHER): Payer: Medicare Other | Admitting: Family

## 2016-05-24 VITALS — BP 118/88 | HR 56 | Temp 97.5°F | Wt 222.0 lb

## 2016-05-24 DIAGNOSIS — Z23 Encounter for immunization: Secondary | ICD-10-CM | POA: Diagnosis not present

## 2016-05-24 DIAGNOSIS — E782 Mixed hyperlipidemia: Secondary | ICD-10-CM

## 2016-05-24 DIAGNOSIS — Z0000 Encounter for general adult medical examination without abnormal findings: Secondary | ICD-10-CM

## 2016-05-24 DIAGNOSIS — I10 Essential (primary) hypertension: Secondary | ICD-10-CM

## 2016-05-24 LAB — COMPREHENSIVE METABOLIC PANEL
ALT: 16 U/L (ref 0–53)
AST: 19 U/L (ref 0–37)
Albumin: 4.2 g/dL (ref 3.5–5.2)
Alkaline Phosphatase: 56 U/L (ref 39–117)
BUN: 20 mg/dL (ref 6–23)
CHLORIDE: 105 meq/L (ref 96–112)
CO2: 30 meq/L (ref 19–32)
Calcium: 9.3 mg/dL (ref 8.4–10.5)
Creatinine, Ser: 1.33 mg/dL (ref 0.40–1.50)
GFR: 56.75 mL/min — ABNORMAL LOW (ref >60.00)
GLUCOSE: 102 mg/dL — AB (ref 70–99)
POTASSIUM: 4.4 meq/L (ref 3.5–5.1)
SODIUM: 141 meq/L (ref 135–145)
Total Bilirubin: 0.7 mg/dL (ref 0.2–1.2)
Total Protein: 6.5 g/dL (ref 6.0–8.3)

## 2016-05-24 LAB — LIPID PANEL
CHOL/HDL RATIO: 3
Cholesterol: 144 mg/dL (ref 0–200)
HDL: 54 mg/dL (ref >39.00)
LDL CALC: 73 mg/dL (ref 0–99)
NONHDL: 90.12
Triglycerides: 86 mg/dL (ref 0.0–149.0)
VLDL: 17.2 mg/dL (ref 0.0–40.0)

## 2016-05-24 MED ORDER — SILDENAFIL CITRATE 20 MG PO TABS: ORAL_TABLET | ORAL | 1 refills | Status: DC

## 2016-05-24 MED ORDER — ATORVASTATIN CALCIUM 20 MG PO TABS: ORAL_TABLET | ORAL | 2 refills | Status: DC

## 2016-05-24 MED ORDER — SILDENAFIL CITRATE 20 MG PO TABS: 20.0000 mg | ORAL_TABLET | Freq: Every day | ORAL | 1 refills | Status: DC | PRN

## 2016-05-24 NOTE — Progress Notes (Signed)
Subjective:    Patient ID: Richard [name], male    DOB: 1946-07-16, 70 y.o.   MRN: [id number]  Chief Complaint  Patient presents with  . Hyperlipidemia  . Hypertension    HPI:  Richard [name] is a 70 y.o. male who  has a past medical history of Aneurysm ([place]); Arthritis; Fasting hyperglycemia; Hyperlipidemia; Hypertension; Nephrolithiasis; Renal insufficiency; Sepsis ([place]) (03/24/2013); Squamous cell carcinoma, face; and Ureteral stone with hydronephrosis (03/24/2013). and presents today for an office follow up.  1.) Hyperlipidemia - Currently maintained on atorvastatin. Reports taking the medication as prescribed and denies adverse side effects or myalgias. Previous cholesterol reviewed and within goals. Denies chest pain, shortness of breath, or heart palpitations.  Allergies  Allergen Reactions  . Angiotensin Receptor Blockers     Angioedema with ACE-I  . Ciprofloxacin     Rash on combination of Cipro & Pyridium after cystoscopic & open resection of renal calculi  . Lisinopril     Swelling of uvula  . Pyridium [Phenazopyridine]     In combo with Cipro   2.) Hypertension - currently maintained on amlodipine and metoprolol. Reports taken medications prescribed and denies adverse side effects. Blood pressures at home have been well-controlled below goal 140/90. Denies worse headache of life or symptoms of end organ damage. Indicates he tries to follow a heart healthy diet.  BP Readings from Last 3 Encounters:  05/24/16 118/88  07/04/15 118/82  05/24/14 128/90      Outpatient Medications Prior to Visit  Medication Sig Dispense Refill  . amLODipine (NORVASC) 5 MG tablet TAKE 1 TABLET BY MOUTH EVERY DAY 90 tablet 1  . aspirin 81 MG EC tablet Take 81 mg by mouth daily.     . Ibuprofen (ADVIL) 200 MG CAPS Take 600-800 mg by mouth every 6 (six) hours as needed. For pain    . metoprolol tartrate (LOPRESSOR) 25 MG tablet TAKE 1 TABLET BY MOUTH TWICE DAILY 180 tablet 1  .  atorvastatin (LIPITOR) 20 MG tablet TAKE 1 TABLET DAILY ON MONDAY, WEDNESDAY, FRIDAY, AND SUNDAY ONLY. 90 tablet 0  . sildenafil (REVATIO) 20 MG tablet 2-5 qd prn 50 tablet 1   No facility-administered medications prior to visit.      Review of Systems  Constitutional: Negative for chills and fever.  Eyes:       Negative for changes in vision  Respiratory: Negative for cough, chest tightness and wheezing.   Cardiovascular: Negative for chest pain, palpitations and leg swelling.  Neurological: Negative for dizziness, weakness and light-headedness.      Objective:    BP 118/88   Pulse (!) 56   Temp 97.5 F (36.4 C)   Wt 222 lb (100.7 kg)   SpO2 98%   BMI 29.29 kg/m  Nursing note and vital signs reviewed.  Physical Exam  Constitutional: He is oriented to person, place, and time. He appears well-developed and well-nourished. No distress.  Cardiovascular: Normal rate, regular rhythm, normal heart sounds and intact distal pulses.  Exam reveals no gallop and no friction rub.   No murmur heard. Pulmonary/Chest: Effort normal and breath sounds normal. No respiratory distress. He has no wheezes. He has no rales. He exhibits no tenderness.  Neurological: He is alert and oriented to person, place, and time.  Skin: Skin is warm and dry.  Psychiatric: He has a normal mood and affect. His behavior is normal. Judgment and thought content normal.       Assessment & Plan:  Problem List Items Addressed This Visit      Cardiovascular and Mediastinum   Essential hypertension    Hypertension is well-controlled below goal 140/90 with current regimen and no adverse side effects. Blood pressures at home have been well controlled as well. Denies symptoms of worse headache of her life and no symptoms of end organ damage noted upon exam. Did have chronic kidney disease stage III it last blood work checked. Obtain complete metabolic profile. Continue to monitor blood pressure at home and continue to  follow a heart healthy diet.       Relevant Medications   sildenafil (REVATIO) 20 MG tablet     Other   HYPERLIPIDEMIA    Hyperlipidemia appears medically controlled current regimen and no adverse side effects or myalgias. Obtain lipid profile and complete metabolic panel. Continue current dosage of atorvastatin pending lipid profile results.      Relevant Medications   sildenafil (REVATIO) 20 MG tablet    Other Visit Diagnoses   None.      I have discontinued Mr. Shehan sildenafil and atorvastatin. I have also changed his sildenafil. Additionally, I am having him maintain his Ibuprofen, aspirin, metoprolol tartrate, and amLODipine.   Meds ordered this encounter  Medications  . DISCONTD: atorvastatin (LIPITOR) 20 MG tablet    Sig: TAKE 1 TABLET DAILY ON MONDAY, WEDNESDAY, FRIDAY, AND SUNDAY ONLY.    Dispense:  90 tablet    Refill:  2    Order Specific Question:   Supervising Provider    Answer:   Pricilla Holm A [id number]  . DISCONTD: sildenafil (REVATIO) 20 MG tablet    Sig: 2-5 qd prn    Dispense:  50 tablet    Refill:  1    Order Specific Question:   Supervising Provider    Answer:   Pricilla Holm A [id number]  . sildenafil (REVATIO) 20 MG tablet    Sig: Take 1-5 tablets (20-100 mg total) by mouth daily as needed.    Dispense:  50 tablet    Refill:  1    Order Specific Question:   Supervising Provider    Answer:   Pricilla Holm A [id number]     Follow-up: Return in about 6 months (around 11/22/2016), or if symptoms worsen or fail to improve.  Mauricio Po, FNP

## 2016-05-24 NOTE — Patient Instructions (Signed)
Thank you for choosing [facility].  SUMMARY AND INSTRUCTIONS:  Medication:  Please continue to take your medication as prescribed.   Your prescription(s) have been submitted to your pharmacy or been printed and provided for you. Please take as directed and contact our office if you believe you are having problem(s) with the medication(s) or have any questions.  Labs:  Please stop by the lab on the lower level of the building for your blood work. Your results will be released to MyChart (or called to you) after review, usually within 72 hours after test completion. If any changes need to be made, you will be notified at that same time.  1.) The lab is open from 7:30am to 5:30 pm Monday-Friday 2.) No appointment is necessary 3.) Fasting (if needed) is 6-8 hours after food and drink; black coffee and water are okay    Follow up:  If your symptoms worsen or fail to improve, please contact our office for further instruction, or in case of emergency go directly to the emergency room at the closest medical facility.     

## 2016-05-24 NOTE — Progress Notes (Signed)
Pre visit review using our clinic review tool, if applicable. No additional management support is needed unless otherwise documented below in the visit note. 

## 2016-05-24 NOTE — Assessment & Plan Note (Signed)
Hyperlipidemia appears medically controlled current regimen and no adverse side effects or myalgias. Obtain lipid profile and complete metabolic panel. Continue current dosage of atorvastatin pending lipid profile results.

## 2016-05-24 NOTE — Assessment & Plan Note (Signed)
Hypertension is well-controlled below goal 140/90 with current regimen and no adverse side effects. Blood pressures at home have been well controlled as well. Denies symptoms of worse headache of her life and no symptoms of end organ damage noted upon exam. Did have chronic kidney disease stage III it last blood work checked. Obtain complete metabolic profile. Continue to monitor blood pressure at home and continue to follow a heart healthy diet.

## 2016-05-25 LAB — HEPATITIS C ANTIBODY: HCV Ab: NEGATIVE

## 2016-07-04 ENCOUNTER — Other Ambulatory Visit: Payer: Self-pay | Admitting: Family

## 2017-03-13 DIAGNOSIS — Z961 Presence of intraocular lens: Secondary | ICD-10-CM | POA: Diagnosis not present

## 2017-06-25 ENCOUNTER — Other Ambulatory Visit: Payer: Self-pay | Admitting: *Deleted

## 2017-06-25 NOTE — Telephone Encounter (Signed)
Rec'd fax pt requesting refill on his Amlodipine. Rx Denied.Marland KitchenMarland KitchenNeed OV for renewal have not seen provider since 04/2016.Marland KitchenJohny Chess

## 2017-06-26 ENCOUNTER — Ambulatory Visit: Payer: Medicare Other | Admitting: Family Medicine

## 2017-06-26 ENCOUNTER — Encounter: Payer: Self-pay | Admitting: Family Medicine

## 2017-06-26 VITALS — BP 122/86 | HR 62 | Temp 98.2°F | Wt 229.4 lb

## 2017-06-26 DIAGNOSIS — E782 Mixed hyperlipidemia: Secondary | ICD-10-CM | POA: Diagnosis not present

## 2017-06-26 DIAGNOSIS — R7301 Impaired fasting glucose: Secondary | ICD-10-CM

## 2017-06-26 DIAGNOSIS — I10 Essential (primary) hypertension: Secondary | ICD-10-CM | POA: Diagnosis not present

## 2017-06-26 DIAGNOSIS — N529 Male erectile dysfunction, unspecified: Secondary | ICD-10-CM | POA: Diagnosis not present

## 2017-06-26 MED ORDER — SILDENAFIL CITRATE 20 MG PO TABS: 20.0000 mg | ORAL_TABLET | Freq: Every day | ORAL | 1 refills | Status: DC | PRN

## 2017-06-26 MED ORDER — AMLODIPINE BESYLATE 5 MG PO TABS: 5.0000 mg | ORAL_TABLET | Freq: Every day | ORAL | 3 refills | Status: DC

## 2017-06-26 MED ORDER — METOPROLOL TARTRATE 25 MG PO TABS
25.0000 mg | ORAL_TABLET | Freq: Two times a day (BID) | ORAL | 3 refills | Status: DC
Start: 2017-06-26 — End: 2018-06-15

## 2017-06-26 MED ORDER — ATORVASTATIN CALCIUM 20 MG PO TABS: ORAL_TABLET | ORAL | 0 refills | Status: DC

## 2017-06-26 NOTE — Assessment & Plan Note (Signed)
Stable.  Continue sildenafil.  Prescription was given today to patient.

## 2017-06-26 NOTE — Progress Notes (Signed)
    Subjective:  Richard [name] is a 71 y.o. male who presents today with a chief complaint of hypertension.   HPI:  Hypertension, Chronic Problem, Stable BP Readings from Last 3 Encounters:  06/26/17 122/86  05/24/16 118/88  07/04/15 118/82   Current Medications: Norvasc 5 mg daily, Lopressor 25 mg twice daily, compliant without side effects.  ROS: Denies any chest pain, shortness of breath, dyspnea on exertion, leg edema.   Hyperlipidemia, Chronic Problem, Stable Current medication(s): Atorvastatin 20 mg daily Side Effects: None  ROS: No chest pain or shortness of breath. No myalgias.  ED, chronic problem, stable Is a sildenafil as needed.  Stable since last visit.  Needs refill today.  ROS: Per HPI  PMH: Smoking history reviewed. Never smoker.   Objective:  Physical Exam: BP 122/86   Pulse 62   Temp 98.2 F (36.8 C) (Oral)   Wt 229 lb 6.4 oz (104.1 kg)   SpO2 97%   BMI 30.27 kg/m   Gen: NAD, resting comfortably CV: RRR with no murmurs appreciated Pulm: NWOB, CTAB with no crackles, wheezes, or rhonchi GI: Normal bowel sounds present. Soft, Nontender, Nondistended. MSK: No edema, cyanosis, or clubbing noted Skin: Warm, dry Neuro: Grossly normal, moves all extremities Psych: Normal affect and thought content  Assessment/Plan:  Essential hypertension At goal.  Continue Norvasc 5 mg of metoprolol 25 mg twice daily.  Check CMET today.  HYPERLIPIDEMIA Check lipid panel today.  Continue atorvastatin.  Fasting hyperglycemia Check A1c today.  Erectile dysfunction Stable.  Continue sildenafil.  Prescription was given today to patient.  Preventive healthcare Patient up-to-date on flu shot.  Discussed colon cancer screening- will check a cologuard.  Also discussed pneumonia shot-however patient deferred today.  PSA normal 2 years ago-no indication to recheck today.  Algis Greenhouse. Jerline Pain, MD 06/26/2017 2:55 PM

## 2017-06-26 NOTE — Assessment & Plan Note (Signed)
At goal.  Continue Norvasc 5 mg of metoprolol 25 mg twice daily.  Check CMET today.

## 2017-06-26 NOTE — Assessment & Plan Note (Signed)
Check lipid panel today. Continue atorvastatin.  

## 2017-06-26 NOTE — Patient Instructions (Signed)
Preventive Care 65 Years and Older, Male Preventive care refers to lifestyle choices and visits with your health care provider that can promote health and wellness. What does preventive care include?  A yearly physical exam. This is also called an annual well check.  Dental exams once or twice a year.  Routine eye exams. Ask your health care provider how often you should have your eyes checked.  Personal lifestyle choices, including: ? Daily care of your teeth and gums. ? Regular physical activity. ? Eating a healthy diet. ? Avoiding tobacco and drug use. ? Limiting alcohol use. ? Practicing safe sex. ? Taking low doses of aspirin every day. ? Taking vitamin and mineral supplements as recommended by your health care provider. What happens during an annual well check? The services and screenings done by your health care provider during your annual well check will depend on your age, overall health, lifestyle risk factors, and family history of disease. Counseling Your health care provider may ask you questions about your:  Alcohol use.  Tobacco use.  Drug use.  Emotional well-being.  Home and relationship well-being.  Sexual activity.  Eating habits.  History of falls.  Memory and ability to understand (cognition).  Work and work environment.  Screening You may have the following tests or measurements:  Height, weight, and BMI.  Blood pressure.  Lipid and cholesterol levels. These may be checked every 5 years, or more frequently if you are over 50 years old.  Skin check.  Lung cancer screening. You may have this screening every year starting at age 55 if you have a 30-pack-year history of smoking and currently smoke or have quit within the past 15 years.  Fecal occult blood test (FOBT) of the stool. You may have this test every year starting at age 50.  Flexible sigmoidoscopy or colonoscopy. You may have a sigmoidoscopy every 5 years or a colonoscopy every 10  years starting at age 50.  Prostate cancer screening. Recommendations will vary depending on your family history and other risks.  Hepatitis C blood test.  Hepatitis B blood test.  Sexually transmitted disease (STD) testing.  Diabetes screening. This is done by checking your blood sugar (glucose) after you have not eaten for a while (fasting). You may have this done every 1-3 years.  Abdominal aortic aneurysm (AAA) screening. You may need this if you are a current or former smoker.  Osteoporosis. You may be screened starting at age 70 if you are at high risk.  Talk with your health care provider about your test results, treatment options, and if necessary, the need for more tests. Vaccines Your health care provider may recommend certain vaccines, such as:  Influenza vaccine. This is recommended every year.  Tetanus, diphtheria, and acellular pertussis (Tdap, Td) vaccine. You may need a Td booster every 10 years.  Varicella vaccine. You may need this if you have not been vaccinated.  Zoster vaccine. You may need this after age 60.  Measles, mumps, and rubella (MMR) vaccine. You may need at least one dose of MMR if you were born in 1957 or later. You may also need a second dose.  Pneumococcal 13-valent conjugate (PCV13) vaccine. One dose is recommended after age 65.  Pneumococcal polysaccharide (PPSV23) vaccine. One dose is recommended after age 65.  Meningococcal vaccine. You may need this if you have certain conditions.  Hepatitis A vaccine. You may need this if you have certain conditions or if you travel or work in places where you   may be exposed to hepatitis A.  Hepatitis B vaccine. You may need this if you have certain conditions or if you travel or work in places where you may be exposed to hepatitis B.  Haemophilus influenzae type b (Hib) vaccine. You may need this if you have certain risk factors.  Talk to your health care provider about which screenings and vaccines  you need and how often you need them. This information is not intended to replace advice given to you by your health care provider. Make sure you discuss any questions you have with your health care provider. Document Released: 08/12/2015 Document Revised: 04/04/2016 Document Reviewed: 05/17/2015 Elsevier Interactive Patient Education  2017 [employer].

## 2017-06-26 NOTE — Assessment & Plan Note (Signed)
Check A1c today.

## 2017-06-27 LAB — COMPREHENSIVE METABOLIC PANEL
ALBUMIN: 4.3 g/dL (ref 3.5–5.2)
ALK PHOS: 62 U/L (ref 39–117)
ALT: 25 U/L (ref 0–53)
AST: 20 U/L (ref 0–37)
BILIRUBIN TOTAL: 0.5 mg/dL (ref 0.2–1.2)
BUN: 23 mg/dL (ref 6–23)
CO2: 29 mEq/L (ref 19–32)
Calcium: 9.2 mg/dL (ref 8.4–10.5)
Chloride: 104 mEq/L (ref 96–112)
Creatinine, Ser: 1.37 mg/dL (ref 0.40–1.50)
GFR: 54.67 mL/min — AB (ref >60.00)
Glucose, Bld: 90 mg/dL (ref 70–99)
POTASSIUM: 4.5 meq/L (ref 3.5–5.1)
Sodium: 142 mEq/L (ref 135–145)
TOTAL PROTEIN: 6.5 g/dL (ref 6.0–8.3)

## 2017-06-27 LAB — CBC
HEMATOCRIT: 48.6 % (ref 39.0–52.0)
HEMOGLOBIN: 15.6 g/dL (ref 13.0–17.0)
MCHC: 32.1 g/dL (ref 30.0–36.0)
MCV: 83 fl (ref 78.0–100.0)
PLATELETS: 220 10*3/uL (ref 150.0–400.0)
RBC: 5.86 Mil/uL — AB (ref 4.22–5.81)
RDW: 15.2 % (ref 11.5–15.5)
WBC: 8 10*3/uL (ref 4.0–10.5)

## 2017-06-27 LAB — LIPID PANEL
CHOL/HDL RATIO: 3
Cholesterol: 172 mg/dL (ref 0–200)
HDL: 56.7 mg/dL (ref >39.00)
LDL CALC: 99 mg/dL (ref 0–99)
NONHDL: 115.34
Triglycerides: 84 mg/dL (ref 0.0–149.0)
VLDL: 16.8 mg/dL (ref 0.0–40.0)

## 2017-06-27 LAB — HEMOGLOBIN A1C: Hgb A1c MFr Bld: 5.8 % (ref 4.6–6.5)

## 2017-06-27 LAB — LDL CHOLESTEROL, DIRECT: LDL DIRECT: 91 mg/dL

## 2017-06-27 NOTE — Progress Notes (Signed)
Labs all normal. Please inform patient. No changes needed to his current treatment plan.  Algis Greenhouse. Jerline Pain, MD 06/27/2017 12:57 PM

## 2017-07-15 DIAGNOSIS — Z1211 Encounter for screening for malignant neoplasm of colon: Secondary | ICD-10-CM | POA: Diagnosis not present

## 2017-07-15 DIAGNOSIS — Z1212 Encounter for screening for malignant neoplasm of rectum: Secondary | ICD-10-CM | POA: Diagnosis not present

## 2017-07-25 LAB — COLOGUARD: COLOGUARD: NEGATIVE

## 2017-08-08 ENCOUNTER — Telehealth: Payer: Self-pay | Admitting: Family Medicine

## 2017-08-08 ENCOUNTER — Telehealth: Payer: Self-pay | Admitting: Family

## 2017-08-08 NOTE — Telephone Encounter (Signed)
Letter has been written and a copy of labs and Cologuard result have been placed in the mail.  Labs and Cologuard were normal.  LM for patient to return call.

## 2017-08-08 NOTE — Telephone Encounter (Signed)
Copied from [place] [id number]. Topic: Quick Communication - See Telephone Encounter >> Aug 08, 2017 12:27 PM Clack, Laban Emperor wrote: CRM for notification. See Telephone encounter for: Pt would like a letter mailed out to him about his lab results from [place] 06/26/17 and would like a call back about his labs on his cologuard. He states that he does not look at his MyChart but would like to keep it active.   08/08/17.

## 2017-08-08 NOTE — Telephone Encounter (Signed)
Copied from [place] [phone]. Topic: Quick Communication - See Telephone Encounter >> Aug 08, 2017 12:24 PM Clack, Laban Emperor wrote: CRM for notification. See Telephone encounter for: Pt would like a letter mailed out to him about his lab results from [place] 06/26/17 and would like a call back about his labs on his cologuard. He states that he does not look at his MyChart but would like to keep it active.  08/08/17.

## 2017-08-08 NOTE — Telephone Encounter (Signed)
SEE NOTE

## 2017-08-12 NOTE — Telephone Encounter (Signed)
LM for patient to return call.

## 2017-09-20 ENCOUNTER — Other Ambulatory Visit: Payer: Self-pay

## 2017-09-20 MED ORDER — ATORVASTATIN CALCIUM 20 MG PO TABS: ORAL_TABLET | ORAL | 0 refills | Status: DC

## 2017-11-19 ENCOUNTER — Encounter: Payer: Self-pay | Admitting: Family Medicine

## 2017-11-19 ENCOUNTER — Ambulatory Visit: Payer: Medicare Other | Admitting: Family Medicine

## 2017-11-19 ENCOUNTER — Ambulatory Visit (INDEPENDENT_AMBULATORY_CARE_PROVIDER_SITE_OTHER): Payer: Medicare Other

## 2017-11-19 VITALS — BP 120/82 | HR 88 | Temp 98.4°F | Ht 74.0 in | Wt 238.0 lb

## 2017-11-19 DIAGNOSIS — R319 Hematuria, unspecified: Secondary | ICD-10-CM

## 2017-11-19 DIAGNOSIS — N23 Unspecified renal colic: Secondary | ICD-10-CM | POA: Diagnosis not present

## 2017-11-19 DIAGNOSIS — N200 Calculus of kidney: Secondary | ICD-10-CM

## 2017-11-19 LAB — BASIC METABOLIC PANEL
BUN: 25 mg/dL — ABNORMAL HIGH (ref 6–23)
CALCIUM: 9.4 mg/dL (ref 8.4–10.5)
CO2: 30 mEq/L (ref 19–32)
Chloride: 98 mEq/L (ref 96–112)
Creatinine, Ser: 2.36 mg/dL — ABNORMAL HIGH (ref 0.40–1.50)
GFR: 29.15 mL/min — AB (ref >60.00)
GLUCOSE: 112 mg/dL — AB (ref 70–99)
Potassium: 4.7 mEq/L (ref 3.5–5.1)
SODIUM: 134 meq/L — AB (ref 135–145)

## 2017-11-19 LAB — POCT URINALYSIS DIPSTICK
Bilirubin, UA: NEGATIVE
Glucose, UA: NEGATIVE
Ketones, UA: NEGATIVE
LEUKOCYTES UA: NEGATIVE
Nitrite, UA: NEGATIVE
PROTEIN UA: NEGATIVE
SPEC GRAV UA: 1.01 (ref 1.010–1.025)
UROBILINOGEN UA: 0.2 U/dL
pH, UA: 5.5 (ref 5.0–8.0)

## 2017-11-19 MED ORDER — TAMSULOSIN HCL 0.4 MG PO CAPS: 0.4000 mg | ORAL_CAPSULE | Freq: Every day | ORAL | 0 refills | Status: DC

## 2017-11-19 MED ORDER — HYDROCODONE-ACETAMINOPHEN 5-325 MG PO TABS: 1.0000 | ORAL_TABLET | Freq: Four times a day (QID) | ORAL | 0 refills | Status: AC | PRN

## 2017-11-19 NOTE — Progress Notes (Signed)
    Subjective:  Richard [name] is a 72 y.o. male who presents today for same-day appointment with a chief complaint of abdominal pain.   HPI:  Abdominal Pain/recurrent nephrolithiasis, Acute Issue Patient with several year history of recurrent nephrolithiasis.  He has seen urology in the past.  Last saw them about 5 years ago.  Over the past couple of days had a significant worsening of his symptoms.  He has found a couple of passed stones in his toilet after urinating.  No gross hematuria, however as noted urine is been a little bit darker.  He has tried taking expired hydrocodone which has not significantly helped.  Pain mostly located in the lower abdomen.  He has had some decreased appetite.  No nausea or vomiting.  No reported fevers or chills.  ROS: Per HPI  PMH: He reports that he has never smoked. He has never used smokeless tobacco. He reports that he does not drink alcohol or use drugs.  Objective:  Physical Exam: BP 120/82 (BP Location: Left Arm)   Pulse 88   Temp 98.4 F (36.9 C) (Oral)   Ht 6\' 2"  (1.88 m)   Wt 238 lb (108 kg)   SpO2 98%   BMI 30.56 kg/m   Gen: NAD, resting comfortably CV: RRR with no murmurs appreciated Pulm: NWOB, CTAB with no crackles, wheezes, or rhonchi GI: Normal bowel sounds present. Soft, Nontender, Nondistended. MSK: No CVA tenderness  Results for orders placed or performed in visit on 11/19/17 (from the past 24 hour(s))  POCT urinalysis dipstick     Status: Abnormal   Collection Time: 11/19/17  1:18 PM  Result Value Ref Range   Color, UA Yellow    Clarity, UA Clear    Glucose, UA Negative    Bilirubin, UA Negative    Ketones, UA Negative    Spec Grav, UA 1.010 1.010 - 1.025   Blood, UA Large    pH, UA 5.5 5.0 - 8.0   Protein, UA Negative    Urobilinogen, UA 0.2 0.2 or 1.0 E.U./dL   Nitrite, UA Negative    Leukocytes, UA Negative Negative   Appearance     Odor     Assessment/Plan:  Abdominal pain/recurrent  nephrolithiasis/hematuria Large hemoglobin on UA.  His KUB is without obvious nephrolithiasis - we will await radiology read.  Given his CKD, we will avoid NSAIDs for symptom control.  I will send in a short supply of hydrocodone to help him with his pain.  We will also start Flomax 0.4 mg daily.  We will check BMET today to assess his renal function.  If he has had an acute worsening of his renal function, will need stat imaging to rule out hydronephrosis.  If renal function is stable, we will wait for another 1 to 2 days to see if stone passes spontaneously.  If stone does not spontaneously passed after a few days, he will need imaging and/or urological evaluation.  Discussed reasons to return to care and seek emergent care including worsening of pain, and/or development of severe nausea, vomiting, fevers, or chills.   Algis Greenhouse. Jerline Pain, MD 11/19/2017 2:18 PM

## 2017-11-19 NOTE — Patient Instructions (Signed)
Please start the Flomax and hydrocodone for your kidney pain.  We will check blood work to make sure your kidney function is stable.  Depending on the results of your blood work, we may be able to wait a few days to see if your stone passes.  If your kidney function has worsened, we will need to get an image of your kidneys to make sure that you are not having a urinary blockage.  If your pain severely worsens, or if you develop severe nausea, vomiting, fevers, or chills, please go to the emergency department.  Take Care, Dr. Jerline Pain

## 2017-11-20 ENCOUNTER — Telehealth: Payer: Self-pay | Admitting: Family Medicine

## 2017-11-20 ENCOUNTER — Other Ambulatory Visit: Payer: Self-pay | Admitting: Family Medicine

## 2017-11-20 ENCOUNTER — Ambulatory Visit (INDEPENDENT_AMBULATORY_CARE_PROVIDER_SITE_OTHER)
Admission: RE | Admit: 2017-11-20 | Discharge: 2017-11-20 | Disposition: A | Discharge status: Home / Self Care | Payer: Medicare Other | Source: Ambulatory Visit | Attending: Family Medicine | Admitting: Family Medicine

## 2017-11-20 DIAGNOSIS — R109 Unspecified abdominal pain: Secondary | ICD-10-CM | POA: Diagnosis not present

## 2017-11-20 DIAGNOSIS — N200 Calculus of kidney: Secondary | ICD-10-CM | POA: Diagnosis not present

## 2017-11-20 NOTE — Telephone Encounter (Signed)
Copied from [place] [phone]. Topic: Quick Communication - See Telephone Encounter >> Nov 20, 2017  4:04 PM Antonieta Iba C wrote: CRM for notification. See Telephone encounter for: 11/20/17.   Stacy with [facility] - Call in to give report she said that It is not urgent. [phone]

## 2017-11-21 ENCOUNTER — Telehealth: Payer: Self-pay

## 2017-11-21 NOTE — Telephone Encounter (Signed)
Documented in result note of CT that Dr. Jerline Pain reviewed results and pt was contacted.

## 2017-11-21 NOTE — Telephone Encounter (Signed)
[facility] and pt has an appt Friday 2:30.

## 2017-11-22 DIAGNOSIS — N201 Calculus of ureter: Secondary | ICD-10-CM | POA: Diagnosis not present

## 2017-12-11 ENCOUNTER — Other Ambulatory Visit: Payer: Self-pay | Admitting: Family Medicine

## 2017-12-13 ENCOUNTER — Other Ambulatory Visit: Payer: Self-pay | Admitting: Family Medicine

## 2018-01-05 ENCOUNTER — Other Ambulatory Visit: Payer: Self-pay | Admitting: Family Medicine

## 2018-03-06 ENCOUNTER — Other Ambulatory Visit: Payer: Self-pay | Admitting: Family Medicine

## 2018-04-04 DIAGNOSIS — Z961 Presence of intraocular lens: Secondary | ICD-10-CM | POA: Diagnosis not present

## 2018-06-06 ENCOUNTER — Other Ambulatory Visit: Payer: Self-pay | Admitting: Family Medicine

## 2018-06-15 ENCOUNTER — Other Ambulatory Visit: Payer: Self-pay | Admitting: Family Medicine

## 2018-06-17 ENCOUNTER — Ambulatory Visit (INDEPENDENT_AMBULATORY_CARE_PROVIDER_SITE_OTHER): Payer: Medicare Other | Admitting: Family Medicine

## 2018-06-17 ENCOUNTER — Encounter: Payer: Self-pay | Admitting: Family Medicine

## 2018-06-17 VITALS — BP 138/86 | HR 63 | Temp 97.8°F | Ht 74.0 in | Wt 203.4 lb

## 2018-06-17 DIAGNOSIS — E782 Mixed hyperlipidemia: Secondary | ICD-10-CM | POA: Diagnosis not present

## 2018-06-17 DIAGNOSIS — I10 Essential (primary) hypertension: Secondary | ICD-10-CM

## 2018-06-17 DIAGNOSIS — N529 Male erectile dysfunction, unspecified: Secondary | ICD-10-CM

## 2018-06-17 DIAGNOSIS — R739 Hyperglycemia, unspecified: Secondary | ICD-10-CM

## 2018-06-17 DIAGNOSIS — R2242 Localized swelling, mass and lump, left lower limb: Secondary | ICD-10-CM | POA: Diagnosis not present

## 2018-06-17 NOTE — Assessment & Plan Note (Signed)
Recommended ultrasound versus surgical referral, however patient elected to wait until after the new year.  It is reassuring that symptoms have been present for the past 3 to 4 years and have not changed significantly however given size and atypical location, I think this needs further evaluation.  We will check CBC and CMET.  Discussed reasons return to care.  He will contact our office when he is ready for surgical referral or ultrasound.

## 2018-06-17 NOTE — Progress Notes (Signed)
   Subjective:  Richard [name] is a 72 y.o. male who presents today with a chief complaint of HTN.   HPI:  Leg Lump Started 3 to 4 years ago.  Located in left inner thigh.  Thinks that it may have grown in size over the last several months.  No pain.  No obvious precipitating events.  No treatments tried.  No fever chills.  He has lost 30+ pounds in the last few months, however this is been intentional.  No reported night sweats.  HTN, chronic problem Currently on Norvasc 5 mg daily and metoprolol tartrate 25 mg twice daily.  Tolerating this well without side effects.  No reported chest pain or shortness of breath.  HLD Currently on Lipitor 20 mg daily and tolerating well.  No reported myalgias.  ROS: Per HPI  PMH: He reports that he has never smoked. He has never used smokeless tobacco. He reports that he does not drink alcohol or use drugs.  Objective:  Physical Exam: BP 138/86 (BP Location: Left Arm, Patient Position: Sitting, Cuff Size: Normal)   Pulse 63   Temp 97.8 F (36.6 C) (Oral)   Ht 6\' 2"  (1.88 m)   Wt 203 lb 6.4 oz (92.3 kg)   SpO2 98%   BMI 26.12 kg/m   Gen: NAD, resting comfortably CV: RRR with no murmurs appreciated Pulm: NWOB, CTAB with no crackles, wheezes, or rhonchi MSK: Approximately 10 to 12 cm mass on left anterior medial thigh.  Mass is mobile.  No fluctuance.  No spreading erythema.  Assessment/Plan:  Mass of left lower extremity Recommended ultrasound versus surgical referral, however patient elected to wait until after the new year.  It is reassuring that symptoms have been present for the past 3 to 4 years and have not changed significantly however given size and atypical location, I think this needs further evaluation.  We will check CBC and CMET.  Discussed reasons return to care.  He will contact our office when he is ready for surgical referral or ultrasound.  HYPERLIPIDEMIA Patient will return for lipid panel.  Continue Lipitor 20 mg  daily.  Essential hypertension At goal.  Continue Norvasc 5 mg daily and metoprolol tartrate 25 mg twice daily.  Check CBC and CMET with blood draw.  Hyperglycemia Check A1c.  Algis Greenhouse. Jerline Pain, MD 06/17/2018 12:28 PM

## 2018-06-17 NOTE — Patient Instructions (Signed)
It was very nice to see you today!  Please let me know if you would like to have an ultrasound of the area on your leg or if you would like to see a surgeon.  Please come back soon for blood work.  No changes today.  Keep up the good work with your weight loss.  I will see you back in 3 to 6 months depending on the results of your blood work, or sooner as needed.  Take care, Dr Jerline Pain

## 2018-06-17 NOTE — Assessment & Plan Note (Addendum)
Patient will return for lipid panel.  Continue Lipitor 20 mg daily.

## 2018-06-17 NOTE — Assessment & Plan Note (Signed)
At goal.  Continue Norvasc 5 mg daily and metoprolol tartrate 25 mg twice daily.  Check CBC and CMET with blood draw.

## 2018-06-30 ENCOUNTER — Other Ambulatory Visit: Payer: Self-pay | Admitting: Family Medicine

## 2018-07-14 DIAGNOSIS — R338 Other retention of urine: Secondary | ICD-10-CM | POA: Diagnosis not present

## 2018-07-14 DIAGNOSIS — N202 Calculus of kidney with calculus of ureter: Secondary | ICD-10-CM | POA: Diagnosis not present

## 2018-07-14 DIAGNOSIS — N132 Hydronephrosis with renal and ureteral calculous obstruction: Secondary | ICD-10-CM | POA: Diagnosis not present

## 2018-07-15 ENCOUNTER — Encounter (HOSPITAL_COMMUNITY)
Admission: RE | Disposition: A | Discharge status: Home / Self Care | Payer: Self-pay | Source: Other Acute Inpatient Hospital | Attending: Urology

## 2018-07-15 ENCOUNTER — Other Ambulatory Visit: Payer: Self-pay

## 2018-07-15 ENCOUNTER — Ambulatory Visit (HOSPITAL_COMMUNITY): Payer: Medicare Other | Admitting: Certified Registered Nurse Anesthetist

## 2018-07-15 ENCOUNTER — Observation Stay (HOSPITAL_COMMUNITY)
Admission: RE | Admit: 2018-07-15 | Discharge: 2018-07-16 | Disposition: A | Discharge status: Home / Self Care | Payer: Medicare Other | Source: Other Acute Inpatient Hospital | Attending: Urology | Admitting: Urology

## 2018-07-15 ENCOUNTER — Other Ambulatory Visit: Payer: Self-pay | Admitting: Urology

## 2018-07-15 ENCOUNTER — Observation Stay (HOSPITAL_COMMUNITY): Payer: Medicare Other

## 2018-07-15 ENCOUNTER — Encounter (HOSPITAL_COMMUNITY): Payer: Self-pay | Admitting: *Deleted

## 2018-07-15 DIAGNOSIS — E785 Hyperlipidemia, unspecified: Secondary | ICD-10-CM | POA: Insufficient documentation

## 2018-07-15 DIAGNOSIS — N289 Disorder of kidney and ureter, unspecified: Secondary | ICD-10-CM

## 2018-07-15 DIAGNOSIS — N132 Hydronephrosis with renal and ureteral calculous obstruction: Principal | ICD-10-CM | POA: Diagnosis present

## 2018-07-15 DIAGNOSIS — Z881 Allergy status to other antibiotic agents status: Secondary | ICD-10-CM | POA: Diagnosis not present

## 2018-07-15 DIAGNOSIS — Z85828 Personal history of other malignant neoplasm of skin: Secondary | ICD-10-CM | POA: Insufficient documentation

## 2018-07-15 DIAGNOSIS — R9431 Abnormal electrocardiogram [ECG] [EKG]: Secondary | ICD-10-CM | POA: Diagnosis not present

## 2018-07-15 DIAGNOSIS — N201 Calculus of ureter: Secondary | ICD-10-CM | POA: Diagnosis not present

## 2018-07-15 DIAGNOSIS — Z87442 Personal history of urinary calculi: Secondary | ICD-10-CM | POA: Diagnosis not present

## 2018-07-15 DIAGNOSIS — Z8249 Family history of ischemic heart disease and other diseases of the circulatory system: Secondary | ICD-10-CM | POA: Insufficient documentation

## 2018-07-15 DIAGNOSIS — I719 Aortic aneurysm of unspecified site, without rupture: Secondary | ICD-10-CM | POA: Insufficient documentation

## 2018-07-15 DIAGNOSIS — M1990 Unspecified osteoarthritis, unspecified site: Secondary | ICD-10-CM | POA: Insufficient documentation

## 2018-07-15 DIAGNOSIS — R2242 Localized swelling, mass and lump, left lower limb: Secondary | ICD-10-CM | POA: Diagnosis not present

## 2018-07-15 DIAGNOSIS — R739 Hyperglycemia, unspecified: Secondary | ICD-10-CM | POA: Insufficient documentation

## 2018-07-15 DIAGNOSIS — N179 Acute kidney failure, unspecified: Secondary | ICD-10-CM | POA: Diagnosis not present

## 2018-07-15 DIAGNOSIS — I10 Essential (primary) hypertension: Secondary | ICD-10-CM | POA: Diagnosis not present

## 2018-07-15 DIAGNOSIS — Z791 Long term (current) use of non-steroidal anti-inflammatories (NSAID): Secondary | ICD-10-CM | POA: Diagnosis not present

## 2018-07-15 DIAGNOSIS — Z888 Allergy status to other drugs, medicaments and biological substances status: Secondary | ICD-10-CM | POA: Insufficient documentation

## 2018-07-15 DIAGNOSIS — Z79899 Other long term (current) drug therapy: Secondary | ICD-10-CM | POA: Diagnosis not present

## 2018-07-15 HISTORY — PX: CYSTOSCOPY WITH STENT PLACEMENT: SHX5790

## 2018-07-15 HISTORY — DX: Personal history of urinary calculi: Z87.442

## 2018-07-15 HISTORY — PX: HOLMIUM LASER APPLICATION: SHX5852

## 2018-07-15 HISTORY — PX: CYSTOSCOPY WITH RETROGRADE PYELOGRAM, URETEROSCOPY AND STENT PLACEMENT: SHX5789

## 2018-07-15 LAB — CBC
HCT: 41 % (ref 39.0–52.0)
Hemoglobin: 12.9 g/dL — ABNORMAL LOW (ref 13.0–17.0)
MCH: 27.6 pg (ref 26.0–34.0)
MCHC: 31.5 g/dL (ref 30.0–36.0)
MCV: 87.8 fL (ref 80.0–100.0)
Platelets: 168 10*3/uL (ref 150–400)
RBC: 4.67 MIL/uL (ref 4.22–5.81)
RDW: 15 % (ref 11.5–15.5)
WBC: 11.4 10*3/uL — ABNORMAL HIGH (ref 4.0–10.5)
nRBC: 0 % (ref 0.0–0.2)

## 2018-07-15 LAB — BASIC METABOLIC PANEL
Anion gap: 14 (ref 5–15)
BUN: 65 mg/dL — ABNORMAL HIGH (ref 8–23)
CO2: 20 mmol/L — ABNORMAL LOW (ref 22–32)
Calcium: 8.3 mg/dL — ABNORMAL LOW (ref 8.9–10.3)
Chloride: 101 mmol/L (ref 98–111)
Creatinine, Ser: 5.81 mg/dL — ABNORMAL HIGH (ref 0.61–1.24)
GFR calc Af Amer: 10 mL/min — ABNORMAL LOW (ref >60)
GFR calc non Af Amer: 9 mL/min — ABNORMAL LOW (ref >60)
Glucose, Bld: 105 mg/dL — ABNORMAL HIGH (ref 70–99)
POTASSIUM: 3.8 mmol/L (ref 3.5–5.1)
Sodium: 135 mmol/L (ref 135–145)

## 2018-07-15 SURGERY — CYSTOURETEROSCOPY, WITH RETROGRADE PYELOGRAM AND STENT INSERTION
Anesthesia: General | Site: Ureter | Laterality: Right

## 2018-07-15 MED ORDER — MEPERIDINE HCL 50 MG/ML IJ SOLN: 6.2500 mg | INTRAMUSCULAR | Status: DC | PRN

## 2018-07-15 MED ORDER — PROPOFOL 10 MG/ML IV BOLUS
INTRAVENOUS | Status: AC
  Filled 2018-07-15: qty 20

## 2018-07-15 MED ORDER — FLEET ENEMA 7-19 GM/118ML RE ENEM: 1.0000 | ENEMA | Freq: Once | RECTAL | Status: DC | PRN

## 2018-07-15 MED ORDER — FENTANYL CITRATE (PF) 100 MCG/2ML IJ SOLN
INTRAMUSCULAR | Status: AC
  Filled 2018-07-15: qty 2

## 2018-07-15 MED ORDER — ONDANSETRON HCL 4 MG/2ML IJ SOLN
INTRAMUSCULAR | Status: AC
  Filled 2018-07-15: qty 2

## 2018-07-15 MED ORDER — IOHEXOL 300 MG/ML  SOLN
INTRAMUSCULAR | Status: DC | PRN
  Administered 2018-07-15: 6 mL

## 2018-07-15 MED ORDER — FENTANYL CITRATE (PF) 100 MCG/2ML IJ SOLN
INTRAMUSCULAR | Status: DC | PRN
  Administered 2018-07-15 (×2): 25 ug via INTRAVENOUS
  Administered 2018-07-15: 100 ug via INTRAVENOUS
  Administered 2018-07-15: 50 ug via INTRAVENOUS

## 2018-07-15 MED ORDER — HYDROMORPHONE HCL 1 MG/ML IJ SOLN: 0.5000 mg | INTRAMUSCULAR | Status: DC | PRN

## 2018-07-15 MED ORDER — PROPOFOL 10 MG/ML IV BOLUS
INTRAVENOUS | Status: DC | PRN
  Administered 2018-07-15: 180 mg via INTRAVENOUS

## 2018-07-15 MED ORDER — HYOSCYAMINE SULFATE 0.125 MG SL SUBL
0.1250 mg | SUBLINGUAL_TABLET | SUBLINGUAL | Status: DC | PRN
  Filled 2018-07-15: qty 1

## 2018-07-15 MED ORDER — METOCLOPRAMIDE HCL 5 MG/ML IJ SOLN: 10.0000 mg | Freq: Once | INTRAMUSCULAR | Status: DC | PRN

## 2018-07-15 MED ORDER — KCL IN DEXTROSE-NACL 10-5-0.45 MEQ/L-%-% IV SOLN
INTRAVENOUS | Status: DC
  Administered 2018-07-15: 23:00:00 via INTRAVENOUS
  Filled 2018-07-15 (×2): qty 1000

## 2018-07-15 MED ORDER — PHENYLEPHRINE 40 MCG/ML (10ML) SYRINGE FOR IV PUSH (FOR BLOOD PRESSURE SUPPORT)
PREFILLED_SYRINGE | INTRAVENOUS | Status: DC | PRN
  Administered 2018-07-15 (×6): 80 ug via INTRAVENOUS

## 2018-07-15 MED ORDER — LACTATED RINGERS IV SOLN
INTRAVENOUS | Status: DC
  Administered 2018-07-15 (×2): via INTRAVENOUS

## 2018-07-15 MED ORDER — PHENYLEPHRINE 40 MCG/ML (10ML) SYRINGE FOR IV PUSH (FOR BLOOD PRESSURE SUPPORT)
PREFILLED_SYRINGE | INTRAVENOUS | Status: AC
  Filled 2018-07-15: qty 10

## 2018-07-15 MED ORDER — FENTANYL CITRATE (PF) 100 MCG/2ML IJ SOLN: 25.0000 ug | INTRAMUSCULAR | Status: DC | PRN

## 2018-07-15 MED ORDER — ONDANSETRON HCL 4 MG/2ML IJ SOLN: 4.0000 mg | INTRAMUSCULAR | Status: DC | PRN

## 2018-07-15 MED ORDER — TAMSULOSIN HCL 0.4 MG PO CAPS: 0.8000 mg | ORAL_CAPSULE | Freq: Every day | ORAL | Status: DC

## 2018-07-15 MED ORDER — DEXAMETHASONE SODIUM PHOSPHATE 10 MG/ML IJ SOLN
INTRAMUSCULAR | Status: DC | PRN
  Administered 2018-07-15: 10 mg via INTRAVENOUS

## 2018-07-15 MED ORDER — CEFAZOLIN SODIUM-DEXTROSE 2-4 GM/100ML-% IV SOLN
2.0000 g | INTRAVENOUS | Status: AC
  Administered 2018-07-15: 2 g via INTRAVENOUS
  Filled 2018-07-15: qty 100

## 2018-07-15 MED ORDER — ONDANSETRON HCL 4 MG/2ML IJ SOLN
INTRAMUSCULAR | Status: DC | PRN
  Administered 2018-07-15: 4 mg via INTRAVENOUS

## 2018-07-15 MED ORDER — HYDROCODONE-ACETAMINOPHEN 5-325 MG PO TABS: 1.0000 | ORAL_TABLET | Freq: Four times a day (QID) | ORAL | Status: DC | PRN

## 2018-07-15 MED ORDER — BISACODYL 10 MG RE SUPP: 10.0000 mg | Freq: Every day | RECTAL | Status: DC | PRN

## 2018-07-15 MED ORDER — AMLODIPINE BESYLATE 5 MG PO TABS: 5.0000 mg | ORAL_TABLET | Freq: Every day | ORAL | Status: DC

## 2018-07-15 MED ORDER — SENNOSIDES-DOCUSATE SODIUM 8.6-50 MG PO TABS: 1.0000 | ORAL_TABLET | Freq: Every evening | ORAL | Status: DC | PRN

## 2018-07-15 MED ORDER — SODIUM CHLORIDE 0.9 % IR SOLN
Status: DC | PRN
  Administered 2018-07-15: 3000 mL via INTRAVESICAL

## 2018-07-15 MED ORDER — ACETAMINOPHEN 325 MG PO TABS: 650.0000 mg | ORAL_TABLET | ORAL | Status: DC | PRN

## 2018-07-15 MED ORDER — SUCCINYLCHOLINE CHLORIDE 200 MG/10ML IV SOSY
PREFILLED_SYRINGE | INTRAVENOUS | Status: AC
  Filled 2018-07-15: qty 10

## 2018-07-15 MED ORDER — DEXAMETHASONE SODIUM PHOSPHATE 10 MG/ML IJ SOLN
INTRAMUSCULAR | Status: AC
  Filled 2018-07-15: qty 1

## 2018-07-15 MED ORDER — SODIUM CHLORIDE 0.9 % IV SOLN
1.0000 g | INTRAVENOUS | Status: DC
  Administered 2018-07-15: 1 g via INTRAVENOUS
  Filled 2018-07-15: qty 1

## 2018-07-15 MED ORDER — LIDOCAINE 2% (20 MG/ML) 5 ML SYRINGE
INTRAMUSCULAR | Status: AC
  Filled 2018-07-15: qty 5

## 2018-07-15 MED ORDER — SUCCINYLCHOLINE CHLORIDE 200 MG/10ML IV SOSY
PREFILLED_SYRINGE | INTRAVENOUS | Status: DC | PRN
  Administered 2018-07-15: 120 mg via INTRAVENOUS

## 2018-07-15 MED ORDER — EPHEDRINE 5 MG/ML INJ
INTRAVENOUS | Status: AC
  Filled 2018-07-15: qty 10

## 2018-07-15 MED ORDER — ATORVASTATIN CALCIUM 20 MG PO TABS: 20.0000 mg | ORAL_TABLET | ORAL | Status: DC

## 2018-07-15 MED ORDER — EPHEDRINE SULFATE-NACL 50-0.9 MG/10ML-% IV SOSY
PREFILLED_SYRINGE | INTRAVENOUS | Status: DC | PRN
  Administered 2018-07-15 (×4): 10 mg via INTRAVENOUS

## 2018-07-15 MED ORDER — LIDOCAINE 2% (20 MG/ML) 5 ML SYRINGE
INTRAMUSCULAR | Status: DC | PRN
  Administered 2018-07-15: 100 mg via INTRAVENOUS

## 2018-07-15 MED ORDER — METOPROLOL TARTRATE 25 MG PO TABS
25.0000 mg | ORAL_TABLET | Freq: Two times a day (BID) | ORAL | Status: DC
  Administered 2018-07-15: 25 mg via ORAL
  Filled 2018-07-15: qty 1

## 2018-07-15 SURGICAL SUPPLY — 27 items
BAG URINE DRAINAGE (UROLOGICAL SUPPLIES) ×1 IMPLANT
BAG URO CATCHER STRL LF (MISCELLANEOUS) ×4 IMPLANT
BASKET STONE NCOMPASS (UROLOGICAL SUPPLIES) IMPLANT
CATH FOLEY 2WAY  5CC 16FR SIL (CATHETERS) ×1
CATH FOLEY 2WAY 5CC 16FR SIL (CATHETERS) IMPLANT
CATH URET 5FR 28IN OPEN ENDED (CATHETERS) IMPLANT
CATH URET DUAL LUMEN 6-10FR 50 (CATHETERS) ×3 IMPLANT
CLOTH BEACON ORANGE TIMEOUT ST (SAFETY) ×4 IMPLANT
COVER SURGICAL LIGHT HANDLE (MISCELLANEOUS) ×4 IMPLANT
COVER WAND RF STERILE (DRAPES) IMPLANT
EXTRACTOR STONE NITINOL NGAGE (UROLOGICAL SUPPLIES) ×3 IMPLANT
FIBER LASER FLEXIVA 1000 (UROLOGICAL SUPPLIES) IMPLANT
FIBER LASER FLEXIVA 365 (UROLOGICAL SUPPLIES) ×1 IMPLANT
FIBER LASER FLEXIVA 550 (UROLOGICAL SUPPLIES) IMPLANT
FIBER LASER TRAC TIP (UROLOGICAL SUPPLIES) IMPLANT
GLOVE SURG SS PI 8.0 STRL IVOR (GLOVE) IMPLANT
GOWN STRL REUS W/TWL XL LVL3 (GOWN DISPOSABLE) ×4 IMPLANT
GUIDEWIRE STR DUAL SENSOR (WIRE) ×4 IMPLANT
IV NS 1000ML (IV SOLUTION) ×4
IV NS 1000ML BAXH (IV SOLUTION) ×3 IMPLANT
IV NS IRRIG 3000ML ARTHROMATIC (IV SOLUTION) ×4 IMPLANT
MANIFOLD NEPTUNE II (INSTRUMENTS) ×4 IMPLANT
PACK CYSTO (CUSTOM PROCEDURE TRAY) ×4 IMPLANT
SHEATH URETERAL 12FRX35CM (MISCELLANEOUS) ×5 IMPLANT
STENT URET 6FRX26 CONTOUR (STENTS) ×1 IMPLANT
TUBING CONNECTING 10 (TUBING) ×4 IMPLANT
TUBING UROLOGY SET (TUBING) ×4 IMPLANT

## 2018-07-15 NOTE — OR Nursing (Signed)
Dr. Jeffie Pollock at bedside requesting to have urine culture obtained from foley and sent to lab.

## 2018-07-15 NOTE — H&P (Signed)
CC: I have a history of kidney stones.  HPI: Richard Richard [name] is a 72 year-old male established Richard [name] who is here for F/U due to a history of renal calculi.  April 2019: Richard Richard [name] is a 72 year old Richard [name] of Dr. Jeffie Richard [name] with a longstanding history of recurrent urolithiasis. He more recently had evidence of calcium phosphate stones per his stone analysis in 2014. He has undergone multiple procedures including percutaneous nephrolithotomy, ureteroscopy, and shockwave lithotripsy. His stones have generally been resistant to shockwave lithotripsy previously. He developed Richard acute onset of left upper quadrant [name] on Sunday. This was not initially associated with any nausea or vomiting. He has not had any fever. As his symptoms persisted throughout Richard week, he did control these with hydrocodone which he had left over from prior stone episodes. By Wednesday, he did present to his primary care physician, Dr. Jerline [name] in underwent a CT scan that confirmed a 5 x 9 mm distal left ureteral calculus. Currently, his [name] is relatively minimal. He has had a poor appetite but has been able to force himself to eat and drink.   07/14/18: Pt passed his KS prior to planned URS on 04/30 with Dr Richard Richard [name]. Procedure was cancelled. He has not had f/u since then. He is c/o inability to void for Richard past 24 hours. It is associated with RLQ abdominal and flank [name]. He states symptoms began a few days ago. He states starting tamsulosin over Richard weekend with no improvement. He has not passed a stone since his ureteroscopy was cancelled. He denies fevers/chills. There has not been burning or painful urination. Denies gross hematuria.   Richard Richard [name] was last seen 11/22/2017.   Richard Richard [name] has had flank [name] since they were last seen. Richard Richard [name] has developed frequency and urgency. He has no seen blood in his urine since Richard last visit. He is currently having flank [name] and groin [name]. He denies having back [name], nausea, vomiting, fever,  and chills.     ALLERGIES: Cipro TABS Lisinopril TABS Phenazopyridine HCl TABS    MEDICATIONS: AmLODIPine Besylate 5 MG Oral Tablet Oral  Atorvastatin Calcium 20 MG Oral Tablet Oral  Daily Vitamin tablet Oral  Metoprolol Tartrate 25 MG Oral Tablet Oral     GU PSH: Cysto Uretero Lithotripsy - 2014, 2013 Cystoscopy Insert Stent - 2014 ESWL - 2009 Percut Stone Removal >2cm - 2013      PSH Notes: Cystoscopy With Ureteroscopy With Lithotripsy, Cystoscopy With Insertion Of Ureteral Stent Left, Percutaneous Lithotomy For Stone Over 2cm., Cystoscopy With Ureteroscopy With Lithotripsy, Lithotripsy   NON-GU PSH: No Non-GU PSH    GU PMH: Acute Cystitis/UTI, Acute Cystitis - 2014 Disorder Kidney/ureter, Unspec, Renal insufficiency - 2014 Flank [name], Diffuse Abdominal [name] - 2014 History of urolithiasis, Nephrolithiasis - 2014 LLQ [name], Abdominal [name] In Richard Left Lower Belly (LLQ) - 2014 Renal calculus, Nephrolithiasis - 2014 Tubulo-interstitial nephritis, not specified as acute or chronic, Pyelonephritis - 2014 Ureteral calculus, Calculus of ureter - 2014, Ureteral Stone, - 2014 Urinary Frequency, Increased urinary frequency - 2014 Urinary Tract Inf, Unspec site, Urinary tract infection - 2014, Pyuria, - 2014      PMH Notes:  2007-08-26 11:01:53 - Note: Blood In Urine  2012-03-03 10:53:31 - Note: Nephrolithiasis Of Richard Left Kidney  2012-02-06 08:51:42 - Note: Aneurysm Of Left Internal Iliac Artery   NON-GU PMH: Personal history of other diseases of Richard circulatory system, History of hypertension - 2014, History of hypertension, - 2014 Personal history of other endocrine, nutritional  and metabolic disease, History of hypercholesterolemia - 2014 Personal history of other specified conditions, History of fever - 2014 Encounter for general adult medical examination without abnormal findings, Encounter for preventive health examination    FAMILY HISTORY: Cardiac Failure - Uncle,  Uncle Death In Richard Family Father - Runs In Family Family Health Status - Mother's Age - Runs In Family Family Health Status Number - Runs In Family   SOCIAL HISTORY: Marital Status: Married Preferred Language: English; Ethnicity: Not Hispanic Or Latino; Race: White Current Smoking Status: Richard [name] has never smoked.   Tobacco Use Assessment Completed: Used Tobacco in last 30 days? Does drink.  Does not drink caffeine.     Notes: Never A Smoker, Marital History - Currently Married, Occupation:, Tobacco Use, Alcohol Use, Caffeine Use   REVIEW OF SYSTEMS:    GU Review Male:   Richard [name] reports frequent urination, hard to postpone urination, get up at night to urinate, trouble starting your stream, and have to strain to urinate . Richard [name] denies burning/ [name] with urination, leakage of urine, stream starts and stops, erection problems, and penile [name].  Gastrointestinal (Upper):   Richard [name] denies nausea, vomiting, and indigestion/ heartburn.  Gastrointestinal (Lower):   Richard [name] denies diarrhea and constipation.  Constitutional:   Richard [name] denies fever, night sweats, weight loss, and fatigue.  Skin:   Richard [name] denies skin rash/ lesion and itching.  Eyes:   Richard [name] denies blurred vision and double vision.  Ears/ Nose/ Throat:   Richard [name] denies sore throat and sinus problems.  Hematologic/Lymphatic:   Richard [name] denies swollen glands and easy bruising.  Cardiovascular:   Richard [name] denies leg swelling and chest pains.  Respiratory:   Richard [name] denies cough and shortness of breath.  Endocrine:   Richard [name] denies excessive thirst.  Musculoskeletal:   Richard [name] denies back [name] and joint [name].  Neurological:   Richard [name] denies headaches and dizziness.  Psychologic:   Richard [name] denies depression and anxiety.   VITAL SIGNS:      07/14/2018 02:47 PM  Weight 202.0 lb / 91.63 kg  Height 73 in / 185.42 cm  BP 146/79 mmHg  Pulse 63 /min  Temperature 98.1 F / 36.7 C  BMI 26.6 kg/m   GU PHYSICAL EXAMINATION:     Scrotum: No lesions. No edema. No cysts. No warts.  Epididymides: Right: no spermatocele, no masses, no cysts, no tenderness, no induration, no enlargement. Left: no spermatocele, no masses, no cysts, no tenderness, no induration, no enlargement.  Testes: No tenderness, no swelling, no enlargement left testes. No tenderness, no swelling, no enlargement right testes. Normal location left testes. Normal location right testes. No mass, no cyst, no varicocele, no hydrocele left testes. No mass, no cyst, no varicocele, no hydrocele right testes.  Urethral Meatus: Normal size. No lesion, no wart, no discharge, no polyp. Normal location.  Penis: Circumcised, no warts, no cracks. No dorsal Peyronie's plaques, no left corporal Peyronie's plaques, no right corporal Peyronie's plaques, no scarring, no warts. No balanitis, no meatal stenosis.   MULTI-SYSTEM PHYSICAL EXAMINATION:    Constitutional: Well-nourished. No physical deformities. Normally developed. Good grooming.  Neck: Neck symmetrical, not swollen. Normal tracheal position.  Respiratory: No labored breathing, no use of accessory muscles.   Skin: No paleness, no jaundice, no cyanosis. No lesion, no ulcer, no rash.  Neurologic / Psychiatric: Oriented to time, oriented to place, oriented to person. No depression, no anxiety, no agitation.  Gastrointestinal: No mass, no tenderness, no rigidity, non obese abdomen. No CVA, flank, or lower abdominal tenderness.  Musculoskeletal: Normal gait and station of head and neck. There is a non-tender soft tissue mass on Richard anterior left upper thigh. He tells me his PCP is aware and plans to have this further imaged with u/s and possible general surgery f/u.     PAST DATA REVIEWED:  Source Of History:  Richard [name]  Records Review:   Previous Richard [name] Records  Urine Test Review:   Urinalysis  Urodynamics Review:   Review Bladder Scan  X-Ray Review: C.T. Stone Protocol: Reviewed Films. Reviewed Report.     07/14/18   Urinalysis  Urine Appearance Cloudy   Urine Color Amber   Urine Glucose Neg mg/dL  Urine Bilirubin Neg mg/dL  Urine Ketones Trace mg/dL  Urine Specific Gravity 1.025   Urine Blood 3+ ery/uL  Urine pH <=5.0   Urine Protein 2+ mg/dL  Urine Urobilinogen 0.2 mg/dL  Urine Nitrites Neg   Urine Leukocyte Esterase Neg leu/uL  Urine WBC/hpf NS (Not Seen)   Urine RBC/hpf >60/hpf   Urine Epithelial Cells 0 - 5/hpf   Urine Bacteria Rare (0-9/hpf)   Urine Mucous Present   Urine Yeast NS (Not Seen)   Urine Trichomonas Not Present   Urine Cystals Amorph Urates   Urine Casts NS (Not Seen)   Urine Sperm Not Present    PROCEDURES:         C.T. Urogram - [id number]              PVR Ultrasound - [id number]  Scanned Volume: 0 cc        Simple Foley Catheterization - 02725  A 16 French Foley Coude catheter was inserted into Richard bladder using sterile technique. Richard Richard [name] was taught routine catheter care. A leg bag was connected. 200 cc of urine was obtained. Richard catheter was then removed prior to him leaving Richard office.          Urinalysis w/Scope Dipstick Dipstick Cont'd Micro  Color: Amber Bilirubin: Neg mg/dL WBC/hpf: NS (Not Seen)  Appearance: Cloudy Ketones: Trace mg/dL RBC/hpf: >60/hpf  Specific Gravity: 1.025 Blood: 3+ ery/uL Bacteria: Rare (0-9/hpf)  pH: <=5.0 Protein: 2+ mg/dL Cystals: Amorph Urates  Glucose: Neg mg/dL Urobilinogen: 0.2 mg/dL Casts: NS (Not Seen)    Nitrites: Neg Trichomonas: Not Present    Leukocyte Esterase: Neg leu/uL Mucous: Present      Epithelial Cells: 0 - 5/hpf      Yeast: NS (Not Seen)      Sperm: Not Present    ASSESSMENT:      ICD-10 Details  1 GU:   Urinary Retention - R33.8 Likely caused by poor PO intake and [name] from his obstructing stone. I will remove his catheter prior to leaving clinic today.   2   Renal and ureteral calculus - N20.2 Right   PLAN:            Medications New Meds: Tamsulosin Hcl 0.4 mg capsule 1 capsule PO Q HS   #30  11  Refill(s)    Refill Meds: Oxycodone-Acetaminophen 5 mg-325 mg tablet 1 tablet PO Q 6 H PRN   #20  0 Refill(s)    Stop Meds: Advil TABS Oral  Start: 07/25/2011  Discontinue: 07/14/2018  - Reason: Richard medication cycle was completed.  Aspirin Low Dose 81 MG TABS Oral  Start: 08/01/2007  Discontinue: 07/14/2018  - Reason: Richard medication cycle was completed.            Orders Labs Urine Culture CATH  X-Rays: C.T. Stone Protocol Without Contrast  X-Ray Notes: . History:  Hematuria: Yes/No  Richard [name] to see MD after exam: Yes/No  Previous exam: CT / IVP/ US/ KUB/ None  When:  Where:  Diabetic: Yes/ No  BUN/ Creatinine:  Date of last BUN Creatinine:  Weight in pounds:  Allergy- IV Contrast: Yes/ No  Conflicting diabetic meds: Yes/ No  Diabetic Meds:  Prior Authorization #: [id number] - NA per [facility]           Schedule Return Visit/Planned Activity: 1 Week - Office Visit          Document Letter(s):  Created for Richard [name]: Clinical Summary         Notes:   We were not able to register a PVR on Richard Richard [name]. I had staff place an indwelling catheter to drain Richard bladder and collect a urine specimen based on his report of not voiding in 24 hours. This resulted in only about 200cc of urine output and only when he stood up and relaxed his pelvis. I left catheter in place and proceeded with CT stone protocol examination.     **Further CT review showed a partially obstructing distal left ureteral calculi as well. He is going to return on 12/17 for stat BUN/Creatinine  This revealed an obstructing right mid-ureteral calculus measuring close to 104mm at greatest width. I removed his catheter after reviewing Richard CT imaging and observing his urine output. Richard Richard [name] would like to try and pass this stone on his own. He has historically been able to pass KS of this size. I have requested he take 0.8mg  tamsulosin and increase his hydration. I have given him a refill of percocet as well. He  will return next week for re-evaluation. If he has not passed his calculus then I'll discuss with Dr Richard Richard [name] about getting him set up for URS as previous ESWL attempts have been unsuccessful. SHould he have worsening symptoms over Richard next few days including painful inability to void, worsening right-sided [name]/discomfort, fevers, nausea/vomiting; I instructed him to telephone Richard request to be seen for further evaluation.

## 2018-07-15 NOTE — Transfer of Care (Signed)
Immediate Anesthesia Transfer of Care Note  Patient: Richard [name]  Procedure(s) Performed: CYSTOSCOPY WITH RETROGRADE PYELOGRAM, URETEROSCOPY (Bilateral Ureter) HOLMIUM LASER APPLICATION (Right Ureter) CYSTOSCOPY WITH STENT PLACEMENT (Left Ureter)  Patient Location: PACU  Anesthesia Type:General  Level of Consciousness: awake, alert  and oriented  Airway & Oxygen Therapy: Patient Spontanous Breathing and Patient connected to face mask oxygen  Post-op Assessment: Report given to RN and Post -op Vital signs reviewed and stable  Post vital signs: Reviewed and stable  Last Vitals:  Vitals Value Taken Time  BP 136/85 07/15/2018  8:18 PM  Temp    Pulse 79 07/15/2018  8:19 PM  Resp 15 07/15/2018  8:19 PM  SpO2 100 % 07/15/2018  8:19 PM  Vitals shown include unvalidated device data.  Last Pain:  Vitals:   07/15/18 1658  TempSrc:   PainSc: 4       Patients Stated Pain Goal: 2 (09/62/83 6629)  Complications: No apparent anesthesia complications

## 2018-07-15 NOTE — Anesthesia Preprocedure Evaluation (Signed)
Anesthesia Evaluation  Patient identified by MRN, date of birth, ID band Patient awake    Reviewed: Allergy & Precautions, NPO status , Patient's Chart, lab work & pertinent test results  Airway Mallampati: II  TM Distance: >3 FB Neck ROM: Full    Dental no notable dental hx.    Pulmonary neg pulmonary ROS,    Pulmonary exam normal breath sounds clear to auscultation       Cardiovascular hypertension, Pt. on medications negative cardio ROS Normal cardiovascular exam Rhythm:Regular Rate:Normal     Neuro/Psych negative neurological ROS  negative psych ROS   GI/Hepatic negative GI ROS, Neg liver ROS,   Endo/Other  negative endocrine ROS  Renal/GU negative Renal ROS  negative genitourinary   Musculoskeletal negative musculoskeletal ROS (+)   Abdominal   Peds negative pediatric ROS (+)  Hematology negative hematology ROS (+)   Anesthesia Other Findings   Reproductive/Obstetrics negative OB ROS                             Anesthesia Physical Anesthesia Plan  ASA: II  Anesthesia Plan: General   Post-op Pain Management:    Induction: Intravenous, Rapid sequence and Cricoid pressure planned  PONV Risk Score and Plan: 2 and Ondansetron and Treatment may vary due to age or medical condition  Airway Management Planned: Oral ETT  Additional Equipment:   Intra-op Plan:   Post-operative Plan: Extubation in OR  Informed Consent: I have reviewed the patients History and Physical, chart, labs and discussed the procedure including the risks, benefits and alternatives for the proposed anesthesia with the patient or authorized representative who has indicated his/her understanding and acceptance.   Dental advisory given  Plan Discussed with: CRNA  Anesthesia Plan Comments:         Anesthesia Quick Evaluation

## 2018-07-15 NOTE — OR Nursing (Signed)
Urine culture sent to lab.

## 2018-07-15 NOTE — H&P (View-Only) (Signed)
CC: I have a history of kidney stones.  HPI: Richard [name] is a 72 year-old male established patient who is here for F/U due to a history of renal calculi.  April 2019: Richard [name] is a 72 year old patient of Dr. Jeffie Pollock with a longstanding history of recurrent urolithiasis. He more recently had evidence of calcium phosphate stones per his stone analysis in 2014. He has undergone multiple procedures including percutaneous nephrolithotomy, ureteroscopy, and shockwave lithotripsy. His stones have generally been resistant to shockwave lithotripsy previously. He developed the acute onset of left upper quadrant pain on Sunday. This was not initially associated with any nausea or vomiting. He has not had any fever. As his symptoms persisted throughout the week, he did control these with hydrocodone which he had left over from prior stone episodes. By Wednesday, he did present to his primary care physician, Dr. Jerline Pain in underwent a CT scan that confirmed a 5 x 9 mm distal left ureteral calculus. Currently, his pain is relatively minimal. He has had a poor appetite but has been able to force himself to eat and drink.   07/14/18: Pt passed his KS prior to planned URS on 04/30 with Dr Jeffie Pollock. Procedure was cancelled. He has not had f/u since then. He is c/o inability to void for the past 24 hours. It is associated with RLQ abdominal and flank pain. He states symptoms began a few days ago. He states starting tamsulosin over the weekend with no improvement. He has not passed a stone since his ureteroscopy was cancelled. He denies fevers/chills. There has not been burning or painful urination. Denies gross hematuria.   The patient was last seen 11/22/2017.   The patient has had flank pain since they were last seen. The patient has developed frequency and urgency. He has no seen blood in his urine since the last visit. He is currently having flank pain and groin pain. He denies having back pain, nausea, vomiting, fever,  and chills.     ALLERGIES: Cipro TABS Lisinopril TABS Phenazopyridine HCl TABS    MEDICATIONS: AmLODIPine Besylate 5 MG Oral Tablet Oral  Atorvastatin Calcium 20 MG Oral Tablet Oral  Daily Vitamin tablet Oral  Metoprolol Tartrate 25 MG Oral Tablet Oral     GU PSH: Cysto Uretero Lithotripsy - 2014, 2013 Cystoscopy Insert Stent - 2014 ESWL - 2009 Percut Stone Removal >2cm - 2013      PSH Notes: Cystoscopy With Ureteroscopy With Lithotripsy, Cystoscopy With Insertion Of Ureteral Stent Left, Percutaneous Lithotomy For Stone Over 2cm., Cystoscopy With Ureteroscopy With Lithotripsy, Lithotripsy   NON-GU PSH: No Non-GU PSH    GU PMH: Acute Cystitis/UTI, Acute Cystitis - 2014 Disorder Kidney/ureter, Unspec, Renal insufficiency - 2014 Flank Pain, Diffuse Abdominal Pain - 2014 History of urolithiasis, Nephrolithiasis - 2014 LLQ pain, Abdominal Pain In The Left Lower Belly (LLQ) - 2014 Renal calculus, Nephrolithiasis - 2014 Tubulo-interstitial nephritis, not specified as acute or chronic, Pyelonephritis - 2014 Ureteral calculus, Calculus of ureter - 2014, Ureteral Stone, - 2014 Urinary Frequency, Increased urinary frequency - 2014 Urinary Tract Inf, Unspec site, Urinary tract infection - 2014, Pyuria, - 2014      PMH Notes:  2007-08-26 11:01:53 - Note: Blood In Urine  2012-03-03 10:53:31 - Note: Nephrolithiasis Of The Left Kidney  2012-02-06 08:51:42 - Note: Aneurysm Of Left Internal Iliac Artery   NON-GU PMH: Personal history of other diseases of the circulatory system, History of hypertension - 2014, History of hypertension, - 2014 Personal history of other endocrine, nutritional  and metabolic disease, History of hypercholesterolemia - 2014 Personal history of other specified conditions, History of fever - 2014 Encounter for general adult medical examination without abnormal findings, Encounter for preventive health examination    FAMILY HISTORY: Cardiac Failure - Uncle,  Uncle Death In The Family Father - Runs In Family Family Health Status - Mother's Age - Runs In Family Family Health Status Number - Runs In Family   SOCIAL HISTORY: Marital Status: Married Preferred Language: English; Ethnicity: Not Hispanic Or Latino; Race: White Current Smoking Status: Patient has never smoked.   Tobacco Use Assessment Completed: Used Tobacco in last 30 days? Does drink.  Does not drink caffeine.     Notes: Never A Smoker, Marital History - Currently Married, Occupation:, Tobacco Use, Alcohol Use, Caffeine Use   REVIEW OF SYSTEMS:    GU Review Male:   Patient reports frequent urination, hard to postpone urination, get up at night to urinate, trouble starting your stream, and have to strain to urinate . Patient denies burning/ pain with urination, leakage of urine, stream starts and stops, erection problems, and penile pain.  Gastrointestinal (Upper):   Patient denies nausea, vomiting, and indigestion/ heartburn.  Gastrointestinal (Lower):   Patient denies diarrhea and constipation.  Constitutional:   Patient denies fever, night sweats, weight loss, and fatigue.  Skin:   Patient denies skin rash/ lesion and itching.  Eyes:   Patient denies blurred vision and double vision.  Ears/ Nose/ Throat:   Patient denies sore throat and sinus problems.  Hematologic/Lymphatic:   Patient denies swollen glands and easy bruising.  Cardiovascular:   Patient denies leg swelling and chest pains.  Respiratory:   Patient denies cough and shortness of breath.  Endocrine:   Patient denies excessive thirst.  Musculoskeletal:   Patient denies back pain and joint pain.  Neurological:   Patient denies headaches and dizziness.  Psychologic:   Patient denies depression and anxiety.   VITAL SIGNS:      07/14/2018 02:47 PM  Weight 202.0 lb / 91.63 kg  Height 73 in / 185.42 cm  BP 146/79 mmHg  Pulse 63 /min  Temperature 98.1 F / 36.7 C  BMI 26.6 kg/m   GU PHYSICAL EXAMINATION:     Scrotum: No lesions. No edema. No cysts. No warts.  Epididymides: Right: no spermatocele, no masses, no cysts, no tenderness, no induration, no enlargement. Left: no spermatocele, no masses, no cysts, no tenderness, no induration, no enlargement.  Testes: No tenderness, no swelling, no enlargement left testes. No tenderness, no swelling, no enlargement right testes. Normal location left testes. Normal location right testes. No mass, no cyst, no varicocele, no hydrocele left testes. No mass, no cyst, no varicocele, no hydrocele right testes.  Urethral Meatus: Normal size. No lesion, no wart, no discharge, no polyp. Normal location.  Penis: Circumcised, no warts, no cracks. No dorsal Peyronie's plaques, no left corporal Peyronie's plaques, no right corporal Peyronie's plaques, no scarring, no warts. No balanitis, no meatal stenosis.   MULTI-SYSTEM PHYSICAL EXAMINATION:    Constitutional: Well-nourished. No physical deformities. Normally developed. Good grooming.  Neck: Neck symmetrical, not swollen. Normal tracheal position.  Respiratory: No labored breathing, no use of accessory muscles.   Skin: No paleness, no jaundice, no cyanosis. No lesion, no ulcer, no rash.  Neurologic / Psychiatric: Oriented to time, oriented to place, oriented to person. No depression, no anxiety, no agitation.  Gastrointestinal: No mass, no tenderness, no rigidity, non obese abdomen. No CVA, flank, or lower abdominal tenderness.  Musculoskeletal: Normal gait and station of head and neck. There is a non-tender soft tissue mass on the anterior left upper thigh. He tells me his PCP is aware and plans to have this further imaged with u/s and possible general surgery f/u.     PAST DATA REVIEWED:  Source Of History:  Patient  Records Review:   Previous Patient Records  Urine Test Review:   Urinalysis  Urodynamics Review:   Review Bladder Scan  X-Ray Review: C.T. Stone Protocol: Reviewed Films. Reviewed Report.     07/14/18   Urinalysis  Urine Appearance Cloudy   Urine Color Amber   Urine Glucose Neg mg/dL  Urine Bilirubin Neg mg/dL  Urine Ketones Trace mg/dL  Urine Specific Gravity 1.025   Urine Blood 3+ ery/uL  Urine pH <=5.0   Urine Protein 2+ mg/dL  Urine Urobilinogen 0.2 mg/dL  Urine Nitrites Neg   Urine Leukocyte Esterase Neg leu/uL  Urine WBC/hpf NS (Not Seen)   Urine RBC/hpf >60/hpf   Urine Epithelial Cells 0 - 5/hpf   Urine Bacteria Rare (0-9/hpf)   Urine Mucous Present   Urine Yeast NS (Not Seen)   Urine Trichomonas Not Present   Urine Cystals Amorph Urates   Urine Casts NS (Not Seen)   Urine Sperm Not Present    PROCEDURES:         C.T. Urogram - [id number]              PVR Ultrasound - [id number]  Scanned Volume: 0 cc        Simple Foley Catheterization - 55732  A 16 French Foley Coude catheter was inserted into the bladder using sterile technique. The patient was taught routine catheter care. A leg bag was connected. 200 cc of urine was obtained. The catheter was then removed prior to him leaving the office.          Urinalysis w/Scope Dipstick Dipstick Cont'd Micro  Color: Amber Bilirubin: Neg mg/dL WBC/hpf: NS (Not Seen)  Appearance: Cloudy Ketones: Trace mg/dL RBC/hpf: >60/hpf  Specific Gravity: 1.025 Blood: 3+ ery/uL Bacteria: Rare (0-9/hpf)  pH: <=5.0 Protein: 2+ mg/dL Cystals: Amorph Urates  Glucose: Neg mg/dL Urobilinogen: 0.2 mg/dL Casts: NS (Not Seen)    Nitrites: Neg Trichomonas: Not Present    Leukocyte Esterase: Neg leu/uL Mucous: Present      Epithelial Cells: 0 - 5/hpf      Yeast: NS (Not Seen)      Sperm: Not Present    ASSESSMENT:      ICD-10 Details  1 GU:   Urinary Retention - R33.8 Likely caused by poor PO intake and pain from his obstructing stone. I will remove his catheter prior to leaving clinic today.   2   Renal and ureteral calculus - N20.2 Right   PLAN:            Medications New Meds: Tamsulosin Hcl 0.4 mg capsule 1 capsule PO Q HS   #30  11  Refill(s)    Refill Meds: Oxycodone-Acetaminophen 5 mg-325 mg tablet 1 tablet PO Q 6 H PRN   #20  0 Refill(s)    Stop Meds: Advil TABS Oral  Start: 07/25/2011  Discontinue: 07/14/2018  - Reason: The medication cycle was completed.  Aspirin Low Dose 81 MG TABS Oral  Start: 08/01/2007  Discontinue: 07/14/2018  - Reason: The medication cycle was completed.            Orders Labs Urine Culture CATH  X-Rays: C.T. Stone Protocol Without Contrast  X-Ray Notes: . History:  Hematuria: Yes/No  Patient to see MD after exam: Yes/No  Previous exam: CT / IVP/ US/ KUB/ None  When:  Where:  Diabetic: Yes/ No  BUN/ Creatinine:  Date of last BUN Creatinine:  Weight in pounds:  Allergy- IV Contrast: Yes/ No  Conflicting diabetic meds: Yes/ No  Diabetic Meds:  Prior Authorization #: [id number] - NA per [facility]           Schedule Return Visit/Planned Activity: 1 Week - Office Visit          Document Letter(s):  Created for Patient: Clinical Summary         Notes:   We were not able to register a PVR on the patient. I had staff place an indwelling catheter to drain the bladder and collect a urine specimen based on his report of not voiding in 24 hours. This resulted in only about 200cc of urine output and only when he stood up and relaxed his pelvis. I left catheter in place and proceeded with CT stone protocol examination.     **Further CT review showed a partially obstructing distal left ureteral calculi as well. He is going to return on 12/17 for stat BUN/Creatinine  This revealed an obstructing right mid-ureteral calculus measuring close to 41mm at greatest width. I removed his catheter after reviewing the CT imaging and observing his urine output. The patient would like to try and pass this stone on his own. He has historically been able to pass KS of this size. I have requested he take 0.8mg  tamsulosin and increase his hydration. I have given him a refill of percocet as well. He  will return next week for re-evaluation. If he has not passed his calculus then I'll discuss with Dr Jeffie Pollock about getting him set up for URS as previous ESWL attempts have been unsuccessful. SHould he have worsening symptoms over the next few days including painful inability to void, worsening right-sided pain/discomfort, fevers, nausea/vomiting; I instructed him to telephone the request to be seen for further evaluation.

## 2018-07-15 NOTE — Anesthesia Procedure Notes (Signed)
Procedure Name: Intubation Performed by: Tranell Wojtkiewicz D, CRNA Pre-anesthesia Checklist: Patient identified, Emergency Drugs available, Suction available and Patient being monitored Patient Re-evaluated:Patient Re-evaluated prior to induction Oxygen Delivery Method: Circle system utilized Preoxygenation: Pre-oxygenation with 100% oxygen Induction Type: IV induction, Rapid sequence and Cricoid Pressure applied Laryngoscope Size: Mac and 4 Grade View: Grade I Tube type: Oral Tube size: 7.5 mm Number of attempts: 1 Airway Equipment and Method: Stylet Placement Confirmation: ETT inserted through vocal cords under direct vision,  positive ETCO2 and breath sounds checked- equal and bilateral Secured at: 22 cm Tube secured with: Tape Dental Injury: Teeth and Oropharynx as per pre-operative assessment

## 2018-07-15 NOTE — Op Note (Signed)
Procedure: 1.  Cystoscopy with bilateral retrograde pyelograms and interpretation. 2.  Left ureteroscopic stone extraction with holmium laser application. 3.  Right ureteroscopy. 4.  Right ureteral stent insertion.  Preop diagnosis: Left distal ureteral stone and right proximal ureteral stone with AKI.  Postop diagnosis: Same with possible UTI.  Surgeon: Dr. Irine Seal.  Anesthesia: General.  Specimen: Stone fragments from left distal ureteral stone.  Drain: 6 [place] by 26 cm right contour double-J stent.  37 French Foley catheter.  EBL: None.  Complications: None.  Indications: Richard [name] is a 72 year old white male with a history of stones who was recently seen in the office for right flank pain and was found to have a 7 mm right proximal ureteral stone with obstruction.  He was also found to have a 9 mm stone in the right distal ureter with some obstruction his creatinine was found to be over 5 and it was felt that cystoscopy with ureteroscopy, laser application possible stent for his bilateral stones was indicated.  Procedure: He was taken operating room where he was given 2 g of Ancef.  A general anesthetic was induced.  He was placed in lithotomy position and fitted with PAS hose.  His perineum and genitalia were prepped with Betadine solution he was draped in usual sterile fashion.  Cystoscopy was performed using a 23 [place] scope and 30 degree lens.  Examination revealed a normal urethra.  The external sphincter was intact.  The prostatic urethra was approximately 2 to 3 cm in length with mild trilobar hyperplasia without significant obstruction.  Examination of bladder revealed mild trabeculation.  There was some mucosal erythema directly posteriorly but also laterally that was felt to be secondary to Foley catheter placement earlier today.  Ureteral orifice ease were unremarkable.  The left ureteral orifice was cannulated with a 5 French opening catheter and contrast was  instilled.  Left retrograde pyelogram demonstrated a filling defect in the distal ureter consistent with a stone.  There was some dilation of the distal ureter.  A sensor guidewire was passed to the kidney under fluoroscopic guidance and the open-ended catheter and cystoscope were removed.  A 35 cm digital access sheath inner core was used to dilate the ureter.  This was easily done.  The assembled sheath was then used to further dilate the distal ureter to a 14 French diameter.  The 6.5 French semirigid ureteroscope was then passed per urethra into the bladder and then advanced alongside the wire into the left distal ureter.  The stone was readily identified and was felt to be too large to remove intact.  A 365 m laser fiber was inserted with power initially set on 0.5 J but increased to 0.8 J to aid fragmentation.  The rate was set at 10 Hz.  The stone was broken and the manageable fragments which were then moved to the bladder with an engage basket.  Once all of the fragments were removed inspection revealed minimal ureteral trauma so it was not felt that a stent was needed on this side.  The cystoscope was then reinserted over the wire and the wire was removed.  The bladder was then evacuated free of the stone fragments.  A 5 French opening catheter was then used to perform a right retrograde pyelogram.  The right retrograde pyelogram demonstrated a normal caliber mid and distal ureter with a filling defect in the proximal ureter consistent with the stone with some proximal hydronephrosis.  The sensor guidewire was then advanced to the  stone and negotiated by it into the kidney.  The 12/14 French digital access sheath inner core was advanced to just below the stone without difficulty.  The symbol 35 cm sheath was then advanced to just below the kidney stone without difficulty.  The inner core and wire were then removed.  The dual-lumen digital ureteroscope was then passed through the sheath.   The stone was not identified in the proximal ureter and appeared to have been flushed back into the midpole calyx.  The initial flush of urine alongside the wire once this past the stone was clear, but in the kidney when I was trying to engage the stone in the basket to bring it into a more favorable position for lasering I started up some inspissated debris and noted inflamed mucosa.  It was felt that this point that it would probably be best to place a stent and forego further manipulation as there was a possibility of an active infection.  The wire was reinserted to the kidney and the access sheath was removed.  The cystoscope was inserted over the wire and a 6 [place] by 26 cm contour double-J stent was advanced to the kidney under fluoroscopic guidance.  The wire was removed leaving a good coil in the kidney and a good coil in the bladder.  The cystoscope was then removed and a 16 French Foley catheter was placed.  The balloon was filled with 10 mL of sterile water and the catheter was placed to straight drainage.  He was then taken down from the lithotomy position, his anesthetic was reversed and he was moved to recovery in stable condition.  There were no complications.  The left ureteral stone fragments were given to his wife.Marland Kitchen

## 2018-07-15 NOTE — Discharge Instructions (Signed)

## 2018-07-16 ENCOUNTER — Encounter (HOSPITAL_COMMUNITY): Payer: Self-pay | Admitting: Urology

## 2018-07-16 ENCOUNTER — Other Ambulatory Visit: Payer: Self-pay | Admitting: Urology

## 2018-07-16 ENCOUNTER — Observation Stay (HOSPITAL_COMMUNITY): Payer: Medicare Other

## 2018-07-16 DIAGNOSIS — M1990 Unspecified osteoarthritis, unspecified site: Secondary | ICD-10-CM | POA: Diagnosis not present

## 2018-07-16 DIAGNOSIS — R2242 Localized swelling, mass and lump, left lower limb: Secondary | ICD-10-CM | POA: Diagnosis not present

## 2018-07-16 DIAGNOSIS — M7989 Other specified soft tissue disorders: Secondary | ICD-10-CM | POA: Diagnosis not present

## 2018-07-16 DIAGNOSIS — Z8249 Family history of ischemic heart disease and other diseases of the circulatory system: Secondary | ICD-10-CM | POA: Diagnosis not present

## 2018-07-16 DIAGNOSIS — Z888 Allergy status to other drugs, medicaments and biological substances status: Secondary | ICD-10-CM | POA: Diagnosis not present

## 2018-07-16 DIAGNOSIS — Z881 Allergy status to other antibiotic agents status: Secondary | ICD-10-CM | POA: Diagnosis not present

## 2018-07-16 DIAGNOSIS — Z87442 Personal history of urinary calculi: Secondary | ICD-10-CM | POA: Diagnosis not present

## 2018-07-16 DIAGNOSIS — I719 Aortic aneurysm of unspecified site, without rupture: Secondary | ICD-10-CM | POA: Diagnosis not present

## 2018-07-16 DIAGNOSIS — N179 Acute kidney failure, unspecified: Secondary | ICD-10-CM | POA: Diagnosis not present

## 2018-07-16 DIAGNOSIS — Z791 Long term (current) use of non-steroidal anti-inflammatories (NSAID): Secondary | ICD-10-CM | POA: Diagnosis not present

## 2018-07-16 DIAGNOSIS — R9431 Abnormal electrocardiogram [ECG] [EKG]: Secondary | ICD-10-CM | POA: Diagnosis not present

## 2018-07-16 DIAGNOSIS — R739 Hyperglycemia, unspecified: Secondary | ICD-10-CM | POA: Diagnosis not present

## 2018-07-16 DIAGNOSIS — I10 Essential (primary) hypertension: Secondary | ICD-10-CM | POA: Diagnosis not present

## 2018-07-16 DIAGNOSIS — Z85828 Personal history of other malignant neoplasm of skin: Secondary | ICD-10-CM | POA: Diagnosis not present

## 2018-07-16 DIAGNOSIS — E785 Hyperlipidemia, unspecified: Secondary | ICD-10-CM | POA: Diagnosis not present

## 2018-07-16 DIAGNOSIS — Z79899 Other long term (current) drug therapy: Secondary | ICD-10-CM | POA: Diagnosis not present

## 2018-07-16 DIAGNOSIS — N132 Hydronephrosis with renal and ureteral calculous obstruction: Secondary | ICD-10-CM | POA: Diagnosis not present

## 2018-07-16 LAB — BASIC METABOLIC PANEL
Anion gap: 14 (ref 5–15)
BUN: 65 mg/dL — ABNORMAL HIGH (ref 8–23)
CO2: 20 mmol/L — ABNORMAL LOW (ref 22–32)
Calcium: 8.3 mg/dL — ABNORMAL LOW (ref 8.9–10.3)
Chloride: 100 mmol/L (ref 98–111)
Creatinine, Ser: 4.8 mg/dL — ABNORMAL HIGH (ref 0.61–1.24)
GFR calc Af Amer: 13 mL/min — ABNORMAL LOW (ref >60)
GFR calc non Af Amer: 11 mL/min — ABNORMAL LOW (ref >60)
GLUCOSE: 175 mg/dL — AB (ref 70–99)
POTASSIUM: 4.4 mmol/L (ref 3.5–5.1)
Sodium: 134 mmol/L — ABNORMAL LOW (ref 135–145)

## 2018-07-16 LAB — CBC
HCT: 40.6 % (ref 39.0–52.0)
Hemoglobin: 12.7 g/dL — ABNORMAL LOW (ref 13.0–17.0)
MCH: 26.6 pg (ref 26.0–34.0)
MCHC: 31.3 g/dL (ref 30.0–36.0)
MCV: 85.1 fL (ref 80.0–100.0)
Platelets: 167 10*3/uL (ref 150–400)
RBC: 4.77 MIL/uL (ref 4.22–5.81)
RDW: 14.6 % (ref 11.5–15.5)
WBC: 7 10*3/uL (ref 4.0–10.5)
nRBC: 0 % (ref 0.0–0.2)

## 2018-07-16 LAB — HIV ANTIBODY (ROUTINE TESTING W REFLEX): HIV Screen 4th Generation wRfx: NONREACTIVE

## 2018-07-16 MED ORDER — CEPHALEXIN 250 MG PO CAPS: 250.0000 mg | ORAL_CAPSULE | Freq: Every day | ORAL | 0 refills | Status: AC

## 2018-07-16 NOTE — Anesthesia Postprocedure Evaluation (Signed)
Anesthesia Post Note  Patient: Richard [name]  Procedure(s) Performed: CYSTOSCOPY WITH RETROGRADE PYELOGRAM, URETEROSCOPY (Bilateral Ureter) HOLMIUM LASER APPLICATION (Right Ureter) CYSTOSCOPY WITH STENT PLACEMENT (Left Ureter)     Patient location during evaluation: PACU Anesthesia Type: General Level of consciousness: awake and alert Pain management: pain level controlled Vital Signs Assessment: post-procedure vital signs reviewed and stable Respiratory status: spontaneous breathing, nonlabored ventilation, respiratory function stable and patient connected to nasal cannula oxygen Cardiovascular status: blood pressure returned to baseline and stable Postop Assessment: no apparent nausea or vomiting Anesthetic complications: no    Last Vitals:  Vitals:   07/15/18 2145 07/15/18 2205  BP: 138/84 (!) 148/85  Pulse: 68 66  Resp: 11 14  Temp:  37.1 C  SpO2: 100% 98%    Last Pain:  Vitals:   07/15/18 2205  TempSrc: Oral  PainSc: 0-No pain                 Montez Hageman

## 2018-07-16 NOTE — Discharge Summary (Signed)
Physician Discharge Summary  Patient ID: Richard [name] MRN: [id number] DOB/AGE: 1945-11-14 72 y.o.  Admit date: 07/15/2018 Discharge date: 07/16/2018  Admission Diagnoses:  Ureteral stone with hydronephrosis  Discharge Diagnoses:  Principal Problem:   Ureteral stone with hydronephrosis Active Problems:   Renal insufficiency   Mass of left lower extremity   Past Medical History:  Diagnosis Date  . Aneurysm ([place])    left common iliac-stable  . Arthritis   . Fasting hyperglycemia   . History of kidney stones   . Hyperlipidemia    LDL goal = < 100 based on NMR Lipoproprofile  . Hypertension   . Nephrolithiasis    left   . Renal insufficiency   . Sepsis ([place]) 03/24/2013  . Squamous cell carcinoma, face    Dr Sarajane Jews  . Ureteral stone with hydronephrosis 03/24/2013   left    Surgeries: Procedure(s): CYSTOSCOPY WITH RETROGRADE PYELOGRAM, URETEROSCOPY HOLMIUM LASER APPLICATION CYSTOSCOPY WITH STENT PLACEMENT on 07/15/2018   Consultants (if any):   Discharged Condition: Improved  Hospital Course: Richard [name] is an 72 y.o. male who was admitted 07/15/2018 with a diagnosis of Ureteral stone with hydronephrosis and went to the operating room on 07/15/2018 and underwent the above named procedures.  He did well overnight with no pain or fever.  His Cr has declined to 4.81.  An US of the left femoral mass is consistent with a 6cm node.      He was given perioperative antibiotics:  Anti-infectives (From admission, onward)   Start     Dose/Rate Route Frequency Ordered Stop   07/16/18 0000  cephALEXin (KEFLEX) 250 MG capsule     250 mg Oral Daily 07/16/18 0813 07/23/18 2359   07/15/18 2300  cefTRIAXone (ROCEPHIN) 1 g in sodium chloride 0.9 % 100 mL IVPB     1 g 200 mL/hr over 30 Minutes Intravenous Every 24 hours 07/15/18 2208     07/15/18 1611  ceFAZolin (ANCEF) IVPB 2g/100 mL premix     2 g 200 mL/hr over 30 Minutes Intravenous 30 min pre-op 07/15/18 1611 07/15/18 1913     .  He was given sequential compression devices, for DVT prophylaxis.  He benefited maximally from the hospital stay and there were no complications.    Recent vital signs:  Vitals:   07/16/18 0213 07/16/18 0526  BP: 118/80 132/87  Pulse: 64 66  Resp: 18 12  Temp: (!) 97.5 F (36.4 C) 97.9 F (36.6 C)  SpO2: 94% 97%    Recent laboratory studies:  Lab Results  Component Value Date   HGB 12.7 (L) 07/16/2018   HGB 12.9 (L) 07/15/2018   HGB 15.6 06/26/2017   Lab Results  Component Value Date   WBC 7.0 07/16/2018   PLT 167 07/16/2018   Lab Results  Component Value Date   INR 1.03 02/28/2012   Lab Results  Component Value Date   NA 134 (L) 07/16/2018   K 4.4 07/16/2018   CL 100 07/16/2018   CO2 20 (L) 07/16/2018   BUN 65 (H) 07/16/2018   CREATININE 4.80 (H) 07/16/2018   GLUCOSE 175 (H) 07/16/2018    Discharge Medications:   Allergies as of 07/16/2018      Reactions   Angiotensin Receptor Blockers    Angioedema with ACE-I   Ciprofloxacin    Rash on combination of Cipro & Pyridium after cystoscopic & open resection of renal calculi   Lisinopril    Swelling of uvula   Pyridium [phenazopyridine]  In combo with Cipro      Medication List    TAKE these medications   amLODipine 5 MG tablet Commonly known as:  NORVASC TAKE 1 TABLET BY MOUTH EVERY DAY   atorvastatin 20 MG tablet Commonly known as:  LIPITOR TAKE ONE TABLET BY MOUTH DAILY ON MONDAY, WENESDAY, FRIDAY AND SUNDAY ONLY What changed:    how much to take  how to take this  when to take this  additional instructions   cephALEXin 250 MG capsule Commonly known as:  KEFLEX Take 1 capsule (250 mg total) by mouth daily for 7 days.   HYDROcodone-acetaminophen 5-325 MG tablet Commonly known as:  NORCO/VICODIN Take 1 tablet by mouth every 6 (six) hours as needed for moderate pain.   metoprolol tartrate 25 MG tablet Commonly known as:  LOPRESSOR TAKE 1 TABLET BY MOUTH TWICE A DAY    tamsulosin 0.4 MG Caps capsule Commonly known as:  FLOMAX Take 0.8 mg by mouth daily.       Diagnostic Studies: Dg C-arm 1-60 Min-no Report  Result Date: 07/15/2018 Fluoroscopy was utilized by the requesting physician.  No radiographic interpretation.   [place] Soft Tissue Non Vascular  Result Date: 07/16/2018 CLINICAL DATA:  Mass left lower extremity seen on CT. EXAM: ULTRASOUND LEFT LOWER EXTREMITY LIMITED TECHNIQUE: Ultrasound examination of the lower extremity soft tissues was performed in the area of clinical concern. COMPARISON:  CT 07/14/2018 FINDINGS: Large soft tissue mass noted in the left groin measures 6.1 x 5.0 x 2.7 cm. There is internal blood flow and fatty hilum. Other similarly appearing but smaller areas noted compatible with lymph nodes. This large mass likely reflects an enlarged lymph node. IMPRESSION: Soft tissue masses in the left inguinal region most compatible with enlarged lymph nodes/adenopathy, the largest measuring up to 6.1 cm. Electronically Signed   By: Rolm Baptise M.D.   On: 07/16/2018 07:37    Disposition: Discharge disposition: 01-Home or Self Care       Discharge Instructions    Discharge patient   Complete by:  As directed    Discharge disposition:  01-Home or Self Care   Discharge patient date:  07/16/2018   Discontinue IV   Complete by:  As directed       Follow-up Information    Karen Kays, NP On 07/24/2018.   Specialty:  Nurse Practitioner Why:  [place] information: [address] [place] [address] [phone]          I will contact [facility] regarding further evaluation of his left femoral node.  He will be set up for right ureteroscopy in about 2 weeks.    He was given keflex 250mg  daily based on his GFR calculation.   Signed: Irine Seal 07/16/2018, 8:15 AM

## 2018-07-17 ENCOUNTER — Encounter (HOSPITAL_BASED_OUTPATIENT_CLINIC_OR_DEPARTMENT_OTHER): Payer: Self-pay

## 2018-07-17 ENCOUNTER — Other Ambulatory Visit: Payer: Self-pay

## 2018-07-17 LAB — URINE CULTURE: Culture: NO GROWTH

## 2018-07-17 NOTE — Progress Notes (Signed)
Spoke with:  Remo Lipps NPO:  After Midnight, no gum, candy, or mints   Arrival time:  0945AM Labs: Istat 4 (EKG 07/15/2018 in epic)  AM medications: Amlodipine, Metoprolol Pre op orders:  Yes Ride home:  Velva Harman (wife) [phone]

## 2018-07-31 ENCOUNTER — Encounter (HOSPITAL_BASED_OUTPATIENT_CLINIC_OR_DEPARTMENT_OTHER): Payer: Self-pay | Admitting: *Deleted

## 2018-07-31 ENCOUNTER — Ambulatory Visit (HOSPITAL_BASED_OUTPATIENT_CLINIC_OR_DEPARTMENT_OTHER)
Admission: RE | Admit: 2018-07-31 | Discharge: 2018-07-31 | Disposition: A | Discharge status: Home / Self Care | Payer: Medicare Other | Attending: Urology | Admitting: Urology

## 2018-07-31 ENCOUNTER — Ambulatory Visit (HOSPITAL_BASED_OUTPATIENT_CLINIC_OR_DEPARTMENT_OTHER): Payer: Medicare Other | Admitting: Anesthesiology

## 2018-07-31 ENCOUNTER — Encounter (HOSPITAL_BASED_OUTPATIENT_CLINIC_OR_DEPARTMENT_OTHER)
Admission: RE | Disposition: A | Discharge status: Home / Self Care | Payer: Self-pay | Source: Home / Self Care | Attending: Urology

## 2018-07-31 DIAGNOSIS — Z888 Allergy status to other drugs, medicaments and biological substances status: Secondary | ICD-10-CM | POA: Diagnosis not present

## 2018-07-31 DIAGNOSIS — Z87442 Personal history of urinary calculi: Secondary | ICD-10-CM | POA: Diagnosis not present

## 2018-07-31 DIAGNOSIS — M1990 Unspecified osteoarthritis, unspecified site: Secondary | ICD-10-CM | POA: Insufficient documentation

## 2018-07-31 DIAGNOSIS — N201 Calculus of ureter: Secondary | ICD-10-CM | POA: Diagnosis not present

## 2018-07-31 DIAGNOSIS — N202 Calculus of kidney with calculus of ureter: Secondary | ICD-10-CM | POA: Diagnosis not present

## 2018-07-31 DIAGNOSIS — Z79899 Other long term (current) drug therapy: Secondary | ICD-10-CM | POA: Insufficient documentation

## 2018-07-31 DIAGNOSIS — E785 Hyperlipidemia, unspecified: Secondary | ICD-10-CM | POA: Diagnosis not present

## 2018-07-31 DIAGNOSIS — Z8249 Family history of ischemic heart disease and other diseases of the circulatory system: Secondary | ICD-10-CM | POA: Diagnosis not present

## 2018-07-31 DIAGNOSIS — Z791 Long term (current) use of non-steroidal anti-inflammatories (NSAID): Secondary | ICD-10-CM | POA: Insufficient documentation

## 2018-07-31 DIAGNOSIS — I10 Essential (primary) hypertension: Secondary | ICD-10-CM | POA: Insufficient documentation

## 2018-07-31 DIAGNOSIS — Z881 Allergy status to other antibiotic agents status: Secondary | ICD-10-CM | POA: Diagnosis not present

## 2018-07-31 DIAGNOSIS — N200 Calculus of kidney: Secondary | ICD-10-CM | POA: Diagnosis not present

## 2018-07-31 HISTORY — DX: Localized swelling, mass and lump, left lower limb: R22.42

## 2018-07-31 HISTORY — PX: CYSTOSCOPY/URETEROSCOPY/HOLMIUM LASER/STENT PLACEMENT: SHX6546

## 2018-07-31 LAB — POCT I-STAT 4, (NA,K, GLUC, HGB,HCT)
Glucose, Bld: 96 mg/dL (ref 70–99)
HCT: 44 % (ref 39.0–52.0)
Hemoglobin: 15 g/dL (ref 13.0–17.0)
POTASSIUM: 4.1 mmol/L (ref 3.5–5.1)
Sodium: 142 mmol/L (ref 135–145)

## 2018-07-31 LAB — CREATININE, SERUM
Creatinine, Ser: 1.72 mg/dL — ABNORMAL HIGH (ref 0.61–1.24)
GFR calc Af Amer: 46 mL/min — ABNORMAL LOW (ref >60)
GFR calc non Af Amer: 39 mL/min — ABNORMAL LOW (ref >60)

## 2018-07-31 LAB — BUN: BUN: 32 mg/dL — ABNORMAL HIGH (ref 8–23)

## 2018-07-31 SURGERY — CYSTOSCOPY/URETEROSCOPY/HOLMIUM LASER/STENT PLACEMENT
Anesthesia: General | Site: Renal | Laterality: Right

## 2018-07-31 MED ORDER — DEXAMETHASONE SODIUM PHOSPHATE 10 MG/ML IJ SOLN
INTRAMUSCULAR | Status: AC
  Filled 2018-07-31: qty 1

## 2018-07-31 MED ORDER — CEFAZOLIN SODIUM-DEXTROSE 2-4 GM/100ML-% IV SOLN
2.0000 g | INTRAVENOUS | Status: AC
  Administered 2018-07-31: 2 g via INTRAVENOUS
  Filled 2018-07-31: qty 100

## 2018-07-31 MED ORDER — PROPOFOL 10 MG/ML IV BOLUS
INTRAVENOUS | Status: DC | PRN
  Administered 2018-07-31: 150 mg via INTRAVENOUS

## 2018-07-31 MED ORDER — FENTANYL CITRATE (PF) 100 MCG/2ML IJ SOLN
INTRAMUSCULAR | Status: AC
  Filled 2018-07-31: qty 2

## 2018-07-31 MED ORDER — LACTATED RINGERS IV SOLN
INTRAVENOUS | Status: DC
  Administered 2018-07-31: 13:00:00 via INTRAVENOUS
  Filled 2018-07-31: qty 1000

## 2018-07-31 MED ORDER — ONDANSETRON HCL 4 MG/2ML IJ SOLN
INTRAMUSCULAR | Status: DC | PRN
  Administered 2018-07-31: 4 mg via INTRAVENOUS

## 2018-07-31 MED ORDER — SODIUM CHLORIDE 0.9 % IR SOLN
Status: DC | PRN
  Administered 2018-07-31: 3000 mL

## 2018-07-31 MED ORDER — CEFAZOLIN SODIUM-DEXTROSE 2-4 GM/100ML-% IV SOLN
INTRAVENOUS | Status: AC
  Filled 2018-07-31: qty 100

## 2018-07-31 MED ORDER — PROMETHAZINE HCL 25 MG/ML IJ SOLN
6.2500 mg | INTRAMUSCULAR | Status: DC | PRN
  Filled 2018-07-31: qty 1

## 2018-07-31 MED ORDER — LIDOCAINE HCL (CARDIAC) PF 100 MG/5ML IV SOSY
PREFILLED_SYRINGE | INTRAVENOUS | Status: DC | PRN
  Administered 2018-07-31: 80 mg via INTRAVENOUS

## 2018-07-31 MED ORDER — LIDOCAINE 2% (20 MG/ML) 5 ML SYRINGE
INTRAMUSCULAR | Status: AC
  Filled 2018-07-31: qty 5

## 2018-07-31 MED ORDER — ONDANSETRON HCL 4 MG/2ML IJ SOLN
INTRAMUSCULAR | Status: AC
  Filled 2018-07-31: qty 2

## 2018-07-31 MED ORDER — FENTANYL CITRATE (PF) 100 MCG/2ML IJ SOLN
INTRAMUSCULAR | Status: DC | PRN
  Administered 2018-07-31 (×2): 50 ug via INTRAVENOUS

## 2018-07-31 MED ORDER — LACTATED RINGERS IV SOLN
INTRAVENOUS | Status: DC
  Administered 2018-07-31: 11:00:00 via INTRAVENOUS
  Filled 2018-07-31: qty 1000

## 2018-07-31 MED ORDER — PROPOFOL 10 MG/ML IV BOLUS
INTRAVENOUS | Status: AC
  Filled 2018-07-31: qty 20

## 2018-07-31 MED ORDER — MEPERIDINE HCL 25 MG/ML IJ SOLN
6.2500 mg | INTRAMUSCULAR | Status: DC | PRN
  Filled 2018-07-31: qty 1

## 2018-07-31 MED ORDER — PHENYLEPHRINE 40 MCG/ML (10ML) SYRINGE FOR IV PUSH (FOR BLOOD PRESSURE SUPPORT)
PREFILLED_SYRINGE | INTRAVENOUS | Status: DC | PRN
  Administered 2018-07-31 (×3): 100 ug via INTRAVENOUS

## 2018-07-31 MED ORDER — FENTANYL CITRATE (PF) 100 MCG/2ML IJ SOLN
25.0000 ug | INTRAMUSCULAR | Status: DC | PRN
  Filled 2018-07-31: qty 1

## 2018-07-31 MED ORDER — EPHEDRINE SULFATE-NACL 50-0.9 MG/10ML-% IV SOSY
PREFILLED_SYRINGE | INTRAVENOUS | Status: DC | PRN
  Administered 2018-07-31 (×2): 5 mg via INTRAVENOUS
  Administered 2018-07-31 (×2): 7.5 mg via INTRAVENOUS
  Administered 2018-07-31: 5 mg via INTRAVENOUS

## 2018-07-31 MED ORDER — DEXAMETHASONE SODIUM PHOSPHATE 10 MG/ML IJ SOLN
INTRAMUSCULAR | Status: DC | PRN
  Administered 2018-07-31: 5 mg via INTRAVENOUS

## 2018-07-31 SURGICAL SUPPLY — 28 items
BAG DRAIN URO-CYSTO SKYTR STRL (DRAIN) ×3 IMPLANT
BAG DRN UROCATH (DRAIN) ×1
BASKET STONE 1.7 NGAGE (UROLOGICAL SUPPLIES) IMPLANT
BASKET ZERO TIP NITINOL 2.4FR (BASKET) IMPLANT
BSKT STON RTRVL ZERO TP 2.4FR (BASKET)
CATH URET 5FR 28IN CONE TIP (BALLOONS)
CATH URET 5FR 28IN OPEN ENDED (CATHETERS) IMPLANT
CATH URET 5FR 70CM CONE TIP (BALLOONS) IMPLANT
CLOTH BEACON ORANGE TIMEOUT ST (SAFETY) ×3 IMPLANT
ELECT REM PT RETURN 9FT ADLT (ELECTROSURGICAL)
ELECTRODE REM PT RTRN 9FT ADLT (ELECTROSURGICAL) IMPLANT
EXTRACTOR STONE 1.7FRX115CM (UROLOGICAL SUPPLIES) ×2 IMPLANT
FIBER LASER FLEXIVA 365 (UROLOGICAL SUPPLIES) IMPLANT
FIBER LASER TRAC TIP (UROLOGICAL SUPPLIES) IMPLANT
GLOVE SURG SS PI 8.0 STRL IVOR (GLOVE) ×3 IMPLANT
GOWN STRL REUS W/TWL XL LVL3 (GOWN DISPOSABLE) ×5 IMPLANT
GUIDEWIRE ANG ZIPWIRE 038X150 (WIRE) IMPLANT
GUIDEWIRE STR DUAL SENSOR (WIRE) ×3 IMPLANT
INFUSOR MANOMETER BAG 3000ML (MISCELLANEOUS) ×3 IMPLANT
IV NS IRRIG 3000ML ARTHROMATIC (IV SOLUTION) ×3 IMPLANT
KIT TURNOVER CYSTO (KITS) ×3 IMPLANT
MANIFOLD NEPTUNE II (INSTRUMENTS) ×3 IMPLANT
NS IRRIG 500ML POUR BTL (IV SOLUTION) ×3 IMPLANT
PACK CYSTO (CUSTOM PROCEDURE TRAY) ×3 IMPLANT
SHEATH URET ACCESS 12FR/55CM (UROLOGICAL SUPPLIES) ×2 IMPLANT
TUBE CONNECTING 12'X1/4 (SUCTIONS)
TUBE CONNECTING 12X1/4 (SUCTIONS) IMPLANT
TUBING UROLOGY SET (TUBING) IMPLANT

## 2018-07-31 NOTE — Anesthesia Preprocedure Evaluation (Addendum)
Anesthesia Evaluation  Patient identified by MRN, date of birth, ID band Patient awake    Reviewed: Allergy & Precautions, NPO status , Patient's Chart, lab work & pertinent test results  Airway Mallampati: III  TM Distance: >3 FB Neck ROM: Full    Dental  (+) Teeth Intact, Dental Advisory Given   Pulmonary neg pulmonary ROS,    breath sounds clear to auscultation       Cardiovascular hypertension, Pt. on medications and Pt. on home beta blockers  Rhythm:Regular Rate:Normal     Neuro/Psych negative neurological ROS     GI/Hepatic negative GI ROS, Neg liver ROS,   Endo/Other  negative endocrine ROS  Renal/GU Renal InsufficiencyRenal disease     Musculoskeletal  (+) Arthritis , Osteoarthritis,    Abdominal Normal abdominal exam  (+)   Peds  Hematology negative hematology ROS (+)   Anesthesia Other Findings - HLD  Reproductive/Obstetrics                            Anesthesia Physical Anesthesia Plan  ASA: II  Anesthesia Plan: General   Post-op Pain Management:    Induction: Intravenous  PONV Risk Score and Plan: 3 and Ondansetron, Dexamethasone, Treatment may vary due to age or medical condition and Midazolam  Airway Management Planned: LMA  Additional Equipment: None  Intra-op Plan:   Post-operative Plan: Extubation in OR  Informed Consent: I have reviewed the patients History and Physical, chart, labs and discussed the procedure including the risks, benefits and alternatives for the proposed anesthesia with the patient or authorized representative who has indicated his/her understanding and acceptance.   Dental advisory given  Plan Discussed with: CRNA  Anesthesia Plan Comments:        Anesthesia Quick Evaluation

## 2018-07-31 NOTE — Discharge Instructions (Addendum)
CYSTOSCOPY HOME CARE INSTRUCTIONS  Activity: Rest for the remainder of the day.  Do not drive or operate equipment today.  You may resume normal activities in one to two days as instructed by your physician.   Meals: Drink plenty of liquids and eat light foods such as gelatin or soup this evening.  You may return to a normal meal plan tomorrow.  Return to Work: You may return to work in one to two days or as instructed by your physician.  Special Instructions / Symptoms: Call your physician if any of these symptoms occur:   -persistent or heavy bleeding  -bleeding which continues after first few urination  -large blood clots that are difficult to pass  -urine stream diminishes or stops completely  -fever equal to or higher than 101 degrees Farenheit.  -cloudy urine with a strong, foul odor  -severe pain  Females should always wipe from front to back after elimination.  You may feel some burning pain when you urinate.  This should disappear with time.  Applying moist heat to the lower abdomen or a hot tub bath may help relieve the pain.    Post Anesthesia Home Care Instructions  Activity: Get plenty of rest for the remainder of the day. A responsible individual must stay with you for 24 hours following the procedure.  For the next 24 hours, DO NOT: -Drive a car -Paediatric nurse -Drink alcoholic beverages -Take any medication unless instructed by your physician -Make any legal decisions or sign important papers.  Meals: Start with liquid foods such as gelatin or soup. Progress to regular foods as tolerated. Avoid greasy, spicy, heavy foods. If nausea and/or vomiting occur, drink only clear liquids until the nausea and/or vomiting subsides. Call your physician if vomiting continues.  Special Instructions/Symptoms: Your throat may feel dry or sore from the anesthesia or the breathing tube placed in your throat during surgery. If this causes discomfort, gargle with warm salt  water. The discomfort should disappear within 24 hours.  If you had a scopolamine patch placed behind your ear for the management of post- operative nausea and/or vomiting:

## 2018-07-31 NOTE — Op Note (Signed)
Procedure: 1.  Cystoscopy with removal of right double-J stent. 2.  Right ureteroscopy with holmium laser application and stone removal. 3.  Exam under anesthesia.  Preop diagnosis: Right proximal ureteral stone.  Postop diagnosis: Same with stone bump in the kidney.  Surgeon: Dr. Irine Seal.  Anesthesia: General.  Drain: None.  Specimen: Stone fragments.  EBL: None.  Complications: None.  Indications: Richard [name] is a 73 year old male who had previously undergone ureteroscopy for a left distal ureteral stone and stenting for a right proximal stone earlier in the summer.  He returns now for treatment of the right proximal stone.  He is also been found on recent imaging to have a large left femoral lymph node and it was felt that examination anal area was indicated to rule out an anal neoplasm.  Procedure: He was given antibiotic.  A general anesthetic was induced.  He was placed in lithotomy position and fitted with PAS hose.  His perineum and genitalia were prepped with Betadine solution and he was draped in usual sterile fashion.  Cystoscopy was performed using a 23 [place] scope and 30 degree lens.  Examination revealed a normal urethra.  The external sphincter was intact.  The prostatic urethra was short with bilobar hyperplasia without significant obstruction.  Examination of bladder revealed mild trabeculation.  No tumors or stones were noted.  The left ureteral orifice had some moderate erythema from the recent procedure.  The right ureteral orifice had a stent loop present with some surrounding edema.  There was some yellowish grit and encrustation of the distal stent loop suggestive of uric acid material.  The stent loop was grasped with a grasping forceps and pulled the urethral meatus.  A sensor guidewire was then advanced to the kidney under fluoroscopic guidance through the stent and the stent was removed.  A 55 cm digital access sheath 12 French inner core was passed over the wire  and easily advanced to the kidney.  The assembled sheath was then passed.  The inner core and wire were then removed.  The dual-lumen digital flexible ureteroscope was advanced to the kidney and the stone was identified in the lower pole.  The stone was grasped with an engage basket and moved to the renal pelvis.  A 200 m laser was passed and set on 0.5 J and 10 Hz.  The stone was broken and the manageable fragments which were then removed with an engage basket.  Final inspection of the kidney demonstrated an additional small stone on an upper pole papillary tip.  That stone was grasped and removed with the engage basket as well.  Further inspection revealed only Randall's plaques.  The ureter was inspected upon removal of the scope and no significant injury or irritation was noted.  It was not felt that replacement of the stent was indicated.  Ureteroscope was removed and the cystoscope was replaced.  The bladder was drained and the cystoscope was removed.   He then underwent an examination of the anus and perineum.  He had some old hemorrhoidal tags but nothing suggestive of a neoplastic lesion was identified at the anus or in the anal canal.  Patient was taken down from lithotomy position, his anesthetic was reversed and he was moved to recovery room in stable condition.  The stone fragments were given to his wife.

## 2018-07-31 NOTE — Transfer of Care (Signed)
Immediate Anesthesia Transfer of Care Note  Patient: Richard [name]  Procedure(s) Performed: CYSTOSCOPY STENT REMOVAL  RIGHT URETEROSCOPY WITH HOLMIUM LASER POSSIBLE STENT PLACEMENT (Right Renal)  Patient Location: PACU  Anesthesia Type:General  Level of Consciousness: awake, alert , oriented and patient cooperative  Airway & Oxygen Therapy: Patient Spontanous Breathing and Patient connected to nasal cannula oxygen  Post-op Assessment: Report given to RN and Post -op Vital signs reviewed and stable  Post vital signs: Reviewed and stable  Last Vitals:  Vitals Value Taken Time  BP 129/78 07/31/2018 12:33 PM  Temp 36.4 C 07/31/2018 12:33 PM  Pulse 58 07/31/2018 12:33 PM  Resp 0 07/31/2018 12:33 PM  SpO2 98 % 07/31/2018 12:33 PM  Vitals shown include unvalidated device data.  Last Pain:  Vitals:   07/31/18 1012  TempSrc: Oral         Complications: No apparent anesthesia complications

## 2018-07-31 NOTE — Interval H&P Note (Signed)
History and Physical Interval Note:  07/31/2018 12:37 PM  Richard [name]  has presented today for surgery, with the diagnosis of RIGHT RENAL STONE  The various methods of treatment have been discussed with the patient and family. After consideration of risks, benefits and other options for treatment, the patient has consented to  Procedure(s): CYSTOSCOPY STENT REMOVAL  RIGHT URETEROSCOPY WITH HOLMIUM LASER POSSIBLE STENT PLACEMENT (Right) as a surgical intervention .  The patient's history has been reviewed, patient examined, no change in status, stable for surgery.  I have reviewed the patient's chart and labs.  Questions were answered to the patient's satisfaction.     Irine Seal

## 2018-07-31 NOTE — Anesthesia Procedure Notes (Signed)
Procedure Name: LMA Insertion Date/Time: 07/31/2018 11:52 AM Performed by: Raenette Rover, CRNA Pre-anesthesia Checklist: Patient identified, Emergency Drugs available, Suction available and Patient being monitored Patient Re-evaluated:Patient Re-evaluated prior to induction Oxygen Delivery Method: Circle system utilized Preoxygenation: Pre-oxygenation with 100% oxygen Induction Type: IV induction LMA: LMA inserted LMA Size: 4.0 Number of attempts: 1 Placement Confirmation: positive ETCO2,  CO2 detector and breath sounds checked- equal and bilateral Tube secured with: Tape Dental Injury: Teeth and Oropharynx as per pre-operative assessment

## 2018-07-31 NOTE — Anesthesia Postprocedure Evaluation (Signed)
Anesthesia Post Note  Patient: OSHAY STRANAHAN  Procedure(s) Performed: CYSTOSCOPY STENT REMOVAL  RIGHT URETEROSCOPY WITH HOLMIUM LASER POSSIBLE STENT PLACEMENT (Right Renal)     Patient location during evaluation: PACU Anesthesia Type: General Level of consciousness: awake and alert and awake Pain management: pain level controlled Vital Signs Assessment: post-procedure vital signs reviewed and stable Respiratory status: spontaneous breathing, nonlabored ventilation, respiratory function stable and patient connected to nasal cannula oxygen Cardiovascular status: blood pressure returned to baseline and stable Postop Assessment: no apparent nausea or vomiting Anesthetic complications: no    Last Vitals:  Vitals:   07/31/18 1245 07/31/18 1300  BP: 125/83 130/84  Pulse: (!) 52 (!) 53  Resp: 13 13  Temp:    SpO2: 100% 97%    Last Pain:  Vitals:   07/31/18 1300  TempSrc:   PainSc: 0-No pain                 Catalina Gravel

## 2018-08-01 ENCOUNTER — Encounter (HOSPITAL_BASED_OUTPATIENT_CLINIC_OR_DEPARTMENT_OTHER): Payer: Self-pay | Admitting: Urology

## 2018-08-07 DIAGNOSIS — N202 Calculus of kidney with calculus of ureter: Secondary | ICD-10-CM | POA: Diagnosis not present

## 2018-08-11 ENCOUNTER — Other Ambulatory Visit: Payer: Self-pay | Admitting: Surgery

## 2018-08-11 ENCOUNTER — Other Ambulatory Visit (HOSPITAL_COMMUNITY): Payer: Self-pay | Admitting: Surgery

## 2018-08-11 DIAGNOSIS — R590 Localized enlarged lymph nodes: Secondary | ICD-10-CM | POA: Diagnosis not present

## 2018-08-15 ENCOUNTER — Other Ambulatory Visit: Payer: Self-pay | Admitting: Family Medicine

## 2018-08-19 ENCOUNTER — Other Ambulatory Visit: Payer: Self-pay | Admitting: Student

## 2018-08-20 ENCOUNTER — Ambulatory Visit (HOSPITAL_COMMUNITY)
Admission: RE | Admit: 2018-08-20 | Discharge: 2018-08-20 | Disposition: A | Discharge status: Home / Self Care | Payer: Medicare Other | Source: Ambulatory Visit | Attending: Surgery | Admitting: Surgery

## 2018-08-20 ENCOUNTER — Other Ambulatory Visit: Payer: Self-pay

## 2018-08-20 ENCOUNTER — Encounter (HOSPITAL_COMMUNITY): Payer: Self-pay

## 2018-08-20 DIAGNOSIS — I898 Other specified noninfective disorders of lymphatic vessels and lymph nodes: Secondary | ICD-10-CM | POA: Diagnosis not present

## 2018-08-20 DIAGNOSIS — R590 Localized enlarged lymph nodes: Secondary | ICD-10-CM | POA: Insufficient documentation

## 2018-08-20 LAB — CBC
HCT: 45 % (ref 39.0–52.0)
Hemoglobin: 13.7 g/dL (ref 13.0–17.0)
MCH: 26.5 pg (ref 26.0–34.0)
MCHC: 30.4 g/dL (ref 30.0–36.0)
MCV: 87 fL (ref 80.0–100.0)
Platelets: 186 10*3/uL (ref 150–400)
RBC: 5.17 MIL/uL (ref 4.22–5.81)
RDW: 14.8 % (ref 11.5–15.5)
WBC: 7.3 10*3/uL (ref 4.0–10.5)
nRBC: 0 % (ref 0.0–0.2)

## 2018-08-20 LAB — PROTIME-INR
INR: 1.11
Prothrombin Time: 14.2 seconds (ref 11.4–15.2)

## 2018-08-20 LAB — APTT: aPTT: 31 seconds (ref 24–36)

## 2018-08-20 MED ORDER — LIDOCAINE HCL (PF) 1 % IJ SOLN
INTRAMUSCULAR | Status: AC
  Filled 2018-08-20: qty 30

## 2018-08-20 MED ORDER — SODIUM CHLORIDE 0.9 % IV SOLN: INTRAVENOUS | Status: DC

## 2018-08-20 NOTE — Procedures (Signed)
L inguinal LN Core Bx 18 g times four EBL - 5 cc Comp 0

## 2018-08-20 NOTE — Discharge Instructions (Addendum)
Needle Biopsy, Care After  This sheet gives you information about how to care for yourself after your procedure. Your health care provider may also give you more specific instructions. If you have problems or questions, contact your health care provider.  What can I expect after the procedure?  After the procedure, it is common to have soreness, bruising, or mild pain at the puncture site. This should go away in a few days.  Follow these instructions at home:  Needle insertion site care     Wash your hands with soap and water before you change your bandage (dressing). If you cannot use soap and water, use hand sanitizer.   Follow instructions from your health care provider about how to take care of your puncture site. This includes:  ? When and how to change your dressing.  ? When to remove your dressing.   Check your puncture site every day for signs of infection. Check for:  ? Redness, swelling, or pain.  ? Fluid or blood.  ? Pus or a bad smell.  ? Warmth.  General instructions   Return to your normal activities as told by your health care provider. Ask your health care provider what activities are safe for you.   Do not take baths, swim, or use a hot tub until your health care provider approves. Ask your health care provider if you may take showers. You may only be allowed to take sponge baths.   Take over-the-counter and prescription medicines only as told by your health care provider.   Keep all follow-up visits as told by your health care provider. This is important.  Contact a health care provider if:   You have a fever.   You have redness, swelling, or pain at the puncture site that lasts longer than a few days.   You have fluid, blood, or pus coming from your puncture site.   Your puncture site feels warm to the touch.  Get help right away if:   You have severe bleeding from the puncture site.  Summary   After the procedure, it is common to have soreness, bruising, or mild pain at the puncture  site. This should go away in a few days.   Check your puncture site every day for signs of infection, such as redness, swelling, or pain.   Get help right away if you have severe bleeding from your puncture site.  This information is not intended to replace advice given to you by your health care provider. Make sure you discuss any questions you have with your health care provider.  Document Released: 11/30/2014 Document Revised: 07/29/2017 Document Reviewed: 07/29/2017  [employer]vier Interactive Patient Education  2019 [employer]

## 2018-08-20 NOTE — Progress Notes (Signed)
Tolerated procedure well. Band aid applied to left groin and ice applied. D/C home walking with wife. Awake and alert. In no distress.

## 2018-08-20 NOTE — H&P (Signed)
Chief Complaint: Patient was seen in consultation today for (L)inguinal LN biopsy at the request of Cornett,Thomas  Referring Physician(s): Cornett,Thomas  Supervising Physician: Marybelle Killings  Patient Status: [facility] - Out-pt  History of Present Illness: Richard [name] is a 73 y.o. male being worked up for inguinal lymphadenopathy. He is referred for US guided biopsy. PMHx, meds, labs, imaging, allergies reviewed. Feels well, no recent fevers, chills, illness. Has been NPO today as directed. Family at bedside.   Past Medical History:  Diagnosis Date  . Aneurysm ([place])    left common iliac-stable  . Arthritis   . Fasting hyperglycemia   . History of kidney stones   . Hyperlipidemia    LDL goal = < 100 based on NMR Lipoproprofile  . Hypertension   . Mass of left lower extremity    inner thigh  . Nephrolithiasis    left   . Renal insufficiency   . Sepsis ([place]) 03/24/2013  . Squamous cell carcinoma, face    Dr Sarajane Jews  . Ureteral stone with hydronephrosis 03/24/2013   left    Past Surgical History:  Procedure Laterality Date  . CATARACT EXTRACTION  2005   OD   . COLONOSCOPY W/ POLYPECTOMY  2007   adenoma X2 ; [facility]  . CYSTOSCOPY W/ URETERAL STENT PLACEMENT Left 03/23/2013   Procedure: CYSTOSCOPY WITH RETROGRADE PYELOGRAM/URETERAL STENT PLACEMENT;  Surgeon: Fredricka Bonine, MD;  Location: [facility];  Service: Urology;  Laterality: Left;  . CYSTOSCOPY WITH RETROGRADE PYELOGRAM, URETEROSCOPY AND STENT PLACEMENT Bilateral 07/15/2018   Procedure: CYSTOSCOPY WITH RETROGRADE PYELOGRAM, URETEROSCOPY;  Surgeon: Irine Seal, MD;  Location: [facility];  Service: Urology;  Laterality: Bilateral;  . CYSTOSCOPY WITH STENT PLACEMENT Left 07/15/2018   Procedure: CYSTOSCOPY WITH STENT PLACEMENT;  Surgeon: Irine Seal, MD;  Location: [facility];  Service: Urology;  Laterality: Left;  . CYSTOSCOPY WITH URETEROSCOPY  02/28/2012   Procedure: CYSTOSCOPY WITH URETEROSCOPY;  Surgeon: Malka So, MD;  Location: [facility];  Service: Urology;  Laterality: Left;  . CYSTOSCOPY/URETEROSCOPY/HOLMIUM LASER/STENT PLACEMENT Right 07/31/2018   Procedure: CYSTOSCOPY STENT REMOVAL  RIGHT URETEROSCOPY WITH HOLMIUM LASER POSSIBLE STENT PLACEMENT;  Surgeon: Irine Seal, MD;  Location: [facility];  Service: Urology;  Laterality: Right;  . HOLMIUM LASER APPLICATION Left 10/06/7671   Procedure: HOLMIUM LASER APPLICATION;  Surgeon: Malka So, MD;  Location: [facility];  Service: Urology;  Laterality: Left;  . HOLMIUM LASER APPLICATION Right 41/93/7902   Procedure: HOLMIUM LASER APPLICATION;  Surgeon: Irine Seal, MD;  Location: [facility];  Service: Urology;  Laterality: Right;  . LITHOTRIPSY      X2 w/o benefit  . NEPHROLITHOTOMY  02/28/2012   Procedure: NEPHROLITHOTOMY PERCUTANEOUS;  Surgeon: Malka So, MD;  Location: [facility];  Service: Urology;  Laterality: Left;  . TONSILLECTOMY    . URETEROSCOPY Left 03/31/2013   Procedure: URETEROSCOPY;  Surgeon: Malka So, MD;  Location: [facility];  Service: Urology;  Laterality: Left;  Marland Kitchen VASECTOMY      Allergies: Angiotensin receptor blockers; Ciprofloxacin; Lisinopril; and Pyridium [phenazopyridine]  Medications: Prior to Admission medications   Medication Sig Start Date End Date Taking? Authorizing Provider  amLODipine (NORVASC) 5 MG tablet TAKE 1 TABLET BY MOUTH EVERY DAY Patient taking differently: Take 5 mg by mouth daily.  06/06/18  Yes Vivi Barrack, MD  atorvastatin (LIPITOR) 20 MG tablet Take 1 tablet (20 mg total) by mouth See admin instructions. Take 20 mg by mouth on Monday, Wednesday,  Friday and Sunday 08/15/18  Yes Vivi Barrack, MD  metoprolol tartrate (LOPRESSOR) 25 MG tablet TAKE 1 TABLET BY MOUTH TWICE A DAY Patient taking differently: Take 25 mg by mouth 2 (two) times daily.  06/16/18  Yes Vivi Barrack, MD     Family History  Problem Relation Age of Onset  . Heart attack Father 23        CBAG X5 vessels  .  Hyperlipidemia Father   . Hypertension Father   . Heart disease Father        before age 58  . Hypertension Brother   . Aortic aneurysm Paternal Uncle        AAA ruptured  . Heart disease Mother   . Hyperlipidemia Mother   . Hypertension Mother   . Diabetes Neg Hx     Social History   Socioeconomic History  . Marital status: Married    Spouse name: Not on file  . Number of children: 2  . Years of education: 80  . Highest education level: Not on file  Occupational History  . Occupation: Facilities manager (retired)    Fish farm manager: [employer]  . Financial resource strain: Not on file  . Food insecurity:    Worry: Not on file    Inability: Not on file  . Transportation needs:    Medical: Not on file    Non-medical: Not on file  Tobacco Use  . Smoking status: Never Smoker  . Smokeless tobacco: Never Used  Substance and Sexual Activity  . Alcohol use: No    Comment:  rarely  . Drug use: No  . Sexual activity: Not on file  Lifestyle  . Physical activity:    Days per week: Not on file    Minutes per session: Not on file  . Stress: Not on file  Relationships  . Social connections:    Talks on phone: Not on file    Gets together: Not on file    Attends religious service: Not on file    Active member of club or organization: Not on file    Attends meetings of clubs or organizations: Not on file    Relationship status: Not on file  Other Topics Concern  . Not on file  Social History Narrative   Fun: Work around [employer] and work out.      Review of Systems: A 12 point ROS discussed and pertinent positives are indicated in the HPI above.  All other systems are negative.  Review of Systems  Vital Signs: BP (!) 148/88   Pulse (!) 51   Temp 97.6 F (36.4 C) (Oral)   Resp 18   Ht 6\' 1"  (1.854 m)   Wt 90.7 kg   SpO2 99%   BMI 26.39 kg/m   Physical Exam Constitutional:      Appearance: Normal appearance.  HENT:     Mouth/Throat:     Mouth:  Mucous membranes are moist.     Pharynx: Oropharynx is clear.  Cardiovascular:     Rate and Rhythm: Normal rate and regular rhythm.     Heart sounds: Normal heart sounds.  Pulmonary:     Effort: Pulmonary effort is normal. No respiratory distress.     Breath sounds: Normal breath sounds.  Musculoskeletal:     Comments: Palpable large NT (L)inguinal adenopathy  Skin:    General: Skin is warm and dry.  Neurological:     General: No focal deficit present.  Mental Status: He is alert and oriented to person, place, and time.  Psychiatric:        Mood and Affect: Mood normal.        Judgment: Judgment normal.       Imaging: No results found.  Labs:  CBC: Recent Labs    07/15/18 1720 07/16/18 0616 07/31/18 1041  WBC 11.4* 7.0  --   HGB 12.9* 12.7* 15.0  HCT 41.0 40.6 44.0  PLT 168 167  --     COAGS: No results for input(s): INR, APTT in the last 8760 hours.  BMP: Recent Labs    11/19/17 1402 07/15/18 1720 07/16/18 0616 07/31/18 1041 07/31/18 1155  NA 134* 135 134* 142  --   K 4.7 3.8 4.4 4.1  --   CL 98 101 100  --   --   CO2 30 20* 20*  --   --   GLUCOSE 112* 105* 175* 96  --   BUN 25* 65* 65*  --  32*  CALCIUM 9.4 8.3* 8.3*  --   --   CREATININE 2.36* 5.81* 4.80*  --  1.72*  GFRNONAA  --  9* 11*  --  39*  GFRAA  --  10* 13*  --  46*    LIVER FUNCTION TESTS: No results for input(s): BILITOT, AST, ALT, ALKPHOS, PROT, ALBUMIN in the last 8760 hours.  TUMOR MARKERS: No results for input(s): AFPTM, CEA, CA199, CHROMGRNA in the last 8760 hours.  Assessment and Plan: (L)inguinal Lymphadenopathy For US guided biopsy. Discussed with pt and feel he would do fine without sedation, local anesthetic only. Labs pending. Risks and benefits discussed with the patient including, but not limited to bleeding, infection, damage to adjacent structures or low yield requiring additional tests.  All of the patient's questions were answered, patient is agreeable to  proceed. Consent signed and in chart.    Thank you for this interesting consult.  I greatly enjoyed meeting Richard [name] and look forward to participating in their care.  A copy of this report was sent to the requesting provider on this date.  Electronically Signed: Ascencion Dike, PA-C 08/20/2018, 11:44 AM   I spent a total of 20 minutes in face to face in clinical consultation, greater than 50% of which was counseling/coordinating care for LN biopsy

## 2018-08-23 ENCOUNTER — Ambulatory Visit: Payer: Self-pay | Admitting: Surgery

## 2018-09-04 DIAGNOSIS — N200 Calculus of kidney: Secondary | ICD-10-CM | POA: Diagnosis not present

## 2018-09-05 ENCOUNTER — Telehealth: Payer: Self-pay | Admitting: Family Medicine

## 2018-09-05 NOTE — Telephone Encounter (Signed)
See note

## 2018-09-05 NOTE — Telephone Encounter (Signed)
Spoke with patient informed him Dr.Parker is out of the office. I have received his message and will give it to Dr.Parker once he returns.

## 2018-09-05 NOTE — Telephone Encounter (Signed)
Copied from [place] [phone]. Topic: Quick Communication - See Telephone Encounter >> Sep 05, 2018  1:25 PM Sheran Luz wrote: CRM for notification. See Telephone encounter for: 09/05/18.  Patient requesting a call back to discuss "growth" on thigh that has been previously discussed with Dr. Jerline Pain at office visit. Patient states that while having a procedure, the growth was biopsied and the results came back inconclusive. Patient would like to know what Dr. Jerline Pain would recommend on how to proceed, going forward. Please advise.

## 2018-09-08 ENCOUNTER — Encounter (HOSPITAL_BASED_OUTPATIENT_CLINIC_OR_DEPARTMENT_OTHER): Payer: Self-pay | Admitting: *Deleted

## 2018-09-08 ENCOUNTER — Other Ambulatory Visit: Payer: Self-pay

## 2018-09-08 NOTE — Telephone Encounter (Signed)
I dont have any records. Where was the biopsy done? Would recommend following up with the physician that performed the biopsy to discuss next steps.  Algis Greenhouse. Jerline Pain, MD 09/08/2018 10:56 AM

## 2018-09-08 NOTE — Telephone Encounter (Signed)
Please advise 

## 2018-09-08 NOTE — Telephone Encounter (Signed)
Patient notified and voices understanding 

## 2018-09-11 ENCOUNTER — Encounter (HOSPITAL_BASED_OUTPATIENT_CLINIC_OR_DEPARTMENT_OTHER)
Admission: RE | Admit: 2018-09-11 | Discharge: 2018-09-11 | Disposition: A | Discharge status: Home / Self Care | Payer: Medicare Other | Source: Ambulatory Visit | Attending: Surgery | Admitting: Surgery

## 2018-09-11 DIAGNOSIS — Z79899 Other long term (current) drug therapy: Secondary | ICD-10-CM | POA: Diagnosis not present

## 2018-09-11 DIAGNOSIS — I10 Essential (primary) hypertension: Secondary | ICD-10-CM | POA: Diagnosis not present

## 2018-09-11 DIAGNOSIS — Z85828 Personal history of other malignant neoplasm of skin: Secondary | ICD-10-CM | POA: Diagnosis not present

## 2018-09-11 DIAGNOSIS — C8295 Follicular lymphoma, unspecified, lymph nodes of inguinal region and lower limb: Secondary | ICD-10-CM | POA: Diagnosis not present

## 2018-09-11 DIAGNOSIS — N289 Disorder of kidney and ureter, unspecified: Secondary | ICD-10-CM | POA: Diagnosis not present

## 2018-09-11 DIAGNOSIS — Z7982 Long term (current) use of aspirin: Secondary | ICD-10-CM | POA: Diagnosis not present

## 2018-09-11 DIAGNOSIS — R590 Localized enlarged lymph nodes: Secondary | ICD-10-CM | POA: Diagnosis present

## 2018-09-11 DIAGNOSIS — Z01812 Encounter for preprocedural laboratory examination: Secondary | ICD-10-CM | POA: Diagnosis not present

## 2018-09-11 DIAGNOSIS — E785 Hyperlipidemia, unspecified: Secondary | ICD-10-CM | POA: Diagnosis not present

## 2018-09-11 LAB — CBC WITH DIFFERENTIAL/PLATELET
Abs Immature Granulocytes: 0.02 10*3/uL (ref 0.00–0.07)
Basophils Absolute: 0 10*3/uL (ref 0.0–0.1)
Basophils Relative: 1 %
Eosinophils Absolute: 0.2 10*3/uL (ref 0.0–0.5)
Eosinophils Relative: 2 %
HCT: 43.8 % (ref 39.0–52.0)
Hemoglobin: 14 g/dL (ref 13.0–17.0)
Immature Granulocytes: 0 %
Lymphocytes Relative: 26 %
Lymphs Abs: 1.7 10*3/uL (ref 0.7–4.0)
MCH: 27.2 pg (ref 26.0–34.0)
MCHC: 32 g/dL (ref 30.0–36.0)
MCV: 85 fL (ref 80.0–100.0)
Monocytes Absolute: 0.7 10*3/uL (ref 0.1–1.0)
Monocytes Relative: 10 %
Neutro Abs: 4 10*3/uL (ref 1.7–7.7)
Neutrophils Relative %: 61 %
Platelets: 182 10*3/uL (ref 150–400)
RBC: 5.15 MIL/uL (ref 4.22–5.81)
RDW: 14.5 % (ref 11.5–15.5)
WBC: 6.6 10*3/uL (ref 4.0–10.5)
nRBC: 0 % (ref 0.0–0.2)

## 2018-09-11 LAB — COMPREHENSIVE METABOLIC PANEL
ALT: 17 U/L (ref 0–44)
ANION GAP: 7 (ref 5–15)
AST: 19 U/L (ref 15–41)
Albumin: 4 g/dL (ref 3.5–5.0)
Alkaline Phosphatase: 47 U/L (ref 38–126)
BUN: 25 mg/dL — ABNORMAL HIGH (ref 8–23)
CO2: 29 mmol/L (ref 22–32)
Calcium: 9.1 mg/dL (ref 8.9–10.3)
Chloride: 103 mmol/L (ref 98–111)
Creatinine, Ser: 1.64 mg/dL — ABNORMAL HIGH (ref 0.61–1.24)
GFR calc Af Amer: 48 mL/min — ABNORMAL LOW (ref >60)
GFR calc non Af Amer: 42 mL/min — ABNORMAL LOW (ref >60)
Glucose, Bld: 95 mg/dL (ref 70–99)
Potassium: 4.9 mmol/L (ref 3.5–5.1)
SODIUM: 139 mmol/L (ref 135–145)
Total Bilirubin: 0.9 mg/dL (ref 0.3–1.2)
Total Protein: 6.1 g/dL — ABNORMAL LOW (ref 6.5–8.1)

## 2018-09-11 NOTE — Progress Notes (Signed)
Ensure pre-surgery drink given to patient with instructions to complete by 0630 DOS.  Surgical scrub also given to patient with instructions for use.  Patient verbalized understanding of instructions.

## 2018-09-16 ENCOUNTER — Ambulatory Visit (HOSPITAL_BASED_OUTPATIENT_CLINIC_OR_DEPARTMENT_OTHER): Payer: Medicare Other | Admitting: Anesthesiology

## 2018-09-16 ENCOUNTER — Encounter (HOSPITAL_BASED_OUTPATIENT_CLINIC_OR_DEPARTMENT_OTHER)
Admission: RE | Disposition: A | Discharge status: Home / Self Care | Payer: Self-pay | Source: Home / Self Care | Attending: Surgery

## 2018-09-16 ENCOUNTER — Encounter (HOSPITAL_BASED_OUTPATIENT_CLINIC_OR_DEPARTMENT_OTHER): Payer: Self-pay | Admitting: *Deleted

## 2018-09-16 ENCOUNTER — Ambulatory Visit (HOSPITAL_BASED_OUTPATIENT_CLINIC_OR_DEPARTMENT_OTHER)
Admission: RE | Admit: 2018-09-16 | Discharge: 2018-09-16 | Disposition: A | Discharge status: Home / Self Care | Payer: Medicare Other | Attending: Surgery | Admitting: Surgery

## 2018-09-16 DIAGNOSIS — C8295 Follicular lymphoma, unspecified, lymph nodes of inguinal region and lower limb: Secondary | ICD-10-CM | POA: Diagnosis not present

## 2018-09-16 DIAGNOSIS — Z85828 Personal history of other malignant neoplasm of skin: Secondary | ICD-10-CM | POA: Insufficient documentation

## 2018-09-16 DIAGNOSIS — R591 Generalized enlarged lymph nodes: Secondary | ICD-10-CM | POA: Diagnosis not present

## 2018-09-16 DIAGNOSIS — E785 Hyperlipidemia, unspecified: Secondary | ICD-10-CM | POA: Insufficient documentation

## 2018-09-16 DIAGNOSIS — Z7982 Long term (current) use of aspirin: Secondary | ICD-10-CM | POA: Insufficient documentation

## 2018-09-16 DIAGNOSIS — I10 Essential (primary) hypertension: Secondary | ICD-10-CM | POA: Diagnosis not present

## 2018-09-16 DIAGNOSIS — Z79899 Other long term (current) drug therapy: Secondary | ICD-10-CM | POA: Diagnosis not present

## 2018-09-16 DIAGNOSIS — N289 Disorder of kidney and ureter, unspecified: Secondary | ICD-10-CM | POA: Diagnosis not present

## 2018-09-16 HISTORY — PX: INGUINAL LYMPH NODE BIOPSY: SHX5865

## 2018-09-16 SURGERY — BIOPSY, LYMPH NODE, INGUINAL, OPEN
Anesthesia: General | Site: Groin | Laterality: Left

## 2018-09-16 MED ORDER — CHLORHEXIDINE GLUCONATE CLOTH 2 % EX PADS: 6.0000 | MEDICATED_PAD | Freq: Once | CUTANEOUS | Status: DC

## 2018-09-16 MED ORDER — DEXAMETHASONE SODIUM PHOSPHATE 10 MG/ML IJ SOLN
INTRAMUSCULAR | Status: AC
  Filled 2018-09-16: qty 1

## 2018-09-16 MED ORDER — ONDANSETRON HCL 4 MG/2ML IJ SOLN
INTRAMUSCULAR | Status: DC | PRN
  Administered 2018-09-16: 4 mg via INTRAVENOUS

## 2018-09-16 MED ORDER — DEXTROSE 5 % IV SOLN
3.0000 g | INTRAVENOUS | Status: AC
  Administered 2018-09-16: 3 g via INTRAVENOUS

## 2018-09-16 MED ORDER — ONDANSETRON HCL 4 MG/2ML IJ SOLN
INTRAMUSCULAR | Status: AC
  Filled 2018-09-16: qty 2

## 2018-09-16 MED ORDER — GABAPENTIN 300 MG PO CAPS
300.0000 mg | ORAL_CAPSULE | ORAL | Status: AC
  Administered 2018-09-16: 300 mg via ORAL

## 2018-09-16 MED ORDER — FENTANYL CITRATE (PF) 100 MCG/2ML IJ SOLN
50.0000 ug | INTRAMUSCULAR | Status: AC | PRN
  Administered 2018-09-16 (×2): 25 ug via INTRAVENOUS
  Administered 2018-09-16: 50 ug via INTRAVENOUS

## 2018-09-16 MED ORDER — BUPIVACAINE-EPINEPHRINE 0.25% -1:200000 IJ SOLN
INTRAMUSCULAR | Status: DC | PRN
  Administered 2018-09-16: 20 mL

## 2018-09-16 MED ORDER — PROPOFOL 10 MG/ML IV BOLUS
INTRAVENOUS | Status: DC | PRN
  Administered 2018-09-16: 180 mg via INTRAVENOUS

## 2018-09-16 MED ORDER — MIDAZOLAM HCL 2 MG/2ML IJ SOLN
1.0000 mg | INTRAMUSCULAR | Status: DC | PRN
  Administered 2018-09-16: 1 mg via INTRAVENOUS

## 2018-09-16 MED ORDER — DEXAMETHASONE SODIUM PHOSPHATE 10 MG/ML IJ SOLN
INTRAMUSCULAR | Status: DC | PRN
  Administered 2018-09-16: 4 mg via INTRAVENOUS

## 2018-09-16 MED ORDER — GABAPENTIN 300 MG PO CAPS
ORAL_CAPSULE | ORAL | Status: AC
  Filled 2018-09-16: qty 1

## 2018-09-16 MED ORDER — ACETAMINOPHEN 10 MG/ML IV SOLN: 1000.0000 mg | Freq: Once | INTRAVENOUS | Status: DC | PRN

## 2018-09-16 MED ORDER — FENTANYL CITRATE (PF) 100 MCG/2ML IJ SOLN: 25.0000 ug | INTRAMUSCULAR | Status: DC | PRN

## 2018-09-16 MED ORDER — SCOPOLAMINE 1 MG/3DAYS TD PT72: 1.0000 | MEDICATED_PATCH | Freq: Once | TRANSDERMAL | Status: DC | PRN

## 2018-09-16 MED ORDER — EPHEDRINE SULFATE 50 MG/ML IJ SOLN
INTRAMUSCULAR | Status: DC | PRN
  Administered 2018-09-16 (×5): 10 mg via INTRAVENOUS

## 2018-09-16 MED ORDER — ACETAMINOPHEN 500 MG PO TABS
ORAL_TABLET | ORAL | Status: AC
  Filled 2018-09-16: qty 2

## 2018-09-16 MED ORDER — LIDOCAINE 2% (20 MG/ML) 5 ML SYRINGE
INTRAMUSCULAR | Status: AC
  Filled 2018-09-16: qty 5

## 2018-09-16 MED ORDER — CELECOXIB 200 MG PO CAPS
ORAL_CAPSULE | ORAL | Status: AC
  Filled 2018-09-16: qty 1

## 2018-09-16 MED ORDER — MIDAZOLAM HCL 2 MG/2ML IJ SOLN
INTRAMUSCULAR | Status: AC
  Filled 2018-09-16: qty 2

## 2018-09-16 MED ORDER — PROMETHAZINE HCL 25 MG/ML IJ SOLN: 6.2500 mg | INTRAMUSCULAR | Status: DC | PRN

## 2018-09-16 MED ORDER — OXYCODONE HCL 5 MG/5ML PO SOLN: 5.0000 mg | Freq: Once | ORAL | Status: DC | PRN

## 2018-09-16 MED ORDER — HYDROCODONE-ACETAMINOPHEN 5-325 MG PO TABS: 1.0000 | ORAL_TABLET | Freq: Four times a day (QID) | ORAL | 0 refills | Status: DC | PRN

## 2018-09-16 MED ORDER — OXYCODONE HCL 5 MG PO TABS: 5.0000 mg | ORAL_TABLET | Freq: Once | ORAL | Status: DC | PRN

## 2018-09-16 MED ORDER — LIDOCAINE HCL (CARDIAC) PF 100 MG/5ML IV SOSY
PREFILLED_SYRINGE | INTRAVENOUS | Status: DC | PRN
  Administered 2018-09-16: 100 mg via INTRAVENOUS

## 2018-09-16 MED ORDER — EPHEDRINE 5 MG/ML INJ
INTRAVENOUS | Status: AC
  Filled 2018-09-16: qty 10

## 2018-09-16 MED ORDER — LACTATED RINGERS IV SOLN
INTRAVENOUS | Status: DC
  Administered 2018-09-16: 09:00:00 via INTRAVENOUS

## 2018-09-16 MED ORDER — CELECOXIB 200 MG PO CAPS
200.0000 mg | ORAL_CAPSULE | ORAL | Status: AC
  Administered 2018-09-16: 200 mg via ORAL

## 2018-09-16 MED ORDER — FENTANYL CITRATE (PF) 100 MCG/2ML IJ SOLN
INTRAMUSCULAR | Status: AC
  Filled 2018-09-16: qty 2

## 2018-09-16 MED ORDER — CEFAZOLIN SODIUM-DEXTROSE 2-4 GM/100ML-% IV SOLN
INTRAVENOUS | Status: AC
  Filled 2018-09-16: qty 100

## 2018-09-16 MED ORDER — ACETAMINOPHEN 500 MG PO TABS
1000.0000 mg | ORAL_TABLET | ORAL | Status: AC
  Administered 2018-09-16: 1000 mg via ORAL

## 2018-09-16 SURGICAL SUPPLY — 60 items
ADH SKN CLS APL DERMABOND .7 (GAUZE/BANDAGES/DRESSINGS) ×1
APPLIER CLIP 9.375 MED OPEN (MISCELLANEOUS) ×2
APR CLP MED 9.3 20 MLT OPN (MISCELLANEOUS) ×1
BIOPATCH RED 1 DISK 7.0 (GAUZE/BANDAGES/DRESSINGS) ×1 IMPLANT
BLADE CLIPPER SURG (BLADE) ×1 IMPLANT
BLADE SURG 15 STRL LF DISP TIS (BLADE) ×1 IMPLANT
BLADE SURG 15 STRL SS (BLADE) ×2
CANISTER SUCT 1200ML W/VALVE (MISCELLANEOUS) ×1 IMPLANT
CHLORAPREP W/TINT 26ML (MISCELLANEOUS) ×2 IMPLANT
CLIP APPLIE 9.375 MED OPEN (MISCELLANEOUS) IMPLANT
COVER BACK TABLE 60X90IN (DRAPES) ×2 IMPLANT
COVER MAYO STAND STRL (DRAPES) ×2 IMPLANT
COVER WAND RF STERILE (DRAPES) IMPLANT
DECANTER SPIKE VIAL GLASS SM (MISCELLANEOUS) IMPLANT
DERMABOND ADVANCED (GAUZE/BANDAGES/DRESSINGS) ×1
DERMABOND ADVANCED .7 DNX12 (GAUZE/BANDAGES/DRESSINGS) ×1 IMPLANT
DRAIN CHANNEL 19F RND (DRAIN) ×1 IMPLANT
DRAIN PENROSE 1/2X12 LTX STRL (WOUND CARE) IMPLANT
DRAPE LAPAROTOMY TRNSV 102X78 (DRAPE) ×2 IMPLANT
DRAPE UTILITY XL STRL (DRAPES) ×2 IMPLANT
ELECT COATED BLADE 2.86 ST (ELECTRODE) ×2 IMPLANT
ELECT REM PT RETURN 9FT ADLT (ELECTROSURGICAL) ×2
ELECTRODE REM PT RTRN 9FT ADLT (ELECTROSURGICAL) ×1 IMPLANT
EVACUATOR SILICONE 100CC (DRAIN) ×1 IMPLANT
GAUZE 4X4 16PLY RFD (DISPOSABLE) IMPLANT
GAUZE SPONGE 4X4 12PLY STRL LF (GAUZE/BANDAGES/DRESSINGS) IMPLANT
GLOVE BIO SURGEON STRL SZ 6.5 (GLOVE) ×1 IMPLANT
GLOVE BIO SURGEON STRL SZ7 (GLOVE) ×1 IMPLANT
GLOVE BIOGEL PI IND STRL 7.5 (GLOVE) IMPLANT
GLOVE BIOGEL PI IND STRL 8 (GLOVE) ×1 IMPLANT
GLOVE BIOGEL PI INDICATOR 7.5 (GLOVE) ×1
GLOVE BIOGEL PI INDICATOR 8 (GLOVE) ×1
GLOVE ECLIPSE 8.0 STRL XLNG CF (GLOVE) ×2 IMPLANT
GLOVE SURG SS PI 7.0 STRL IVOR (GLOVE) ×2 IMPLANT
GOWN STRL REUS W/ TWL LRG LVL3 (GOWN DISPOSABLE) ×2 IMPLANT
GOWN STRL REUS W/ TWL XL LVL3 (GOWN DISPOSABLE) IMPLANT
GOWN STRL REUS W/TWL LRG LVL3 (GOWN DISPOSABLE) ×4
GOWN STRL REUS W/TWL XL LVL3 (GOWN DISPOSABLE) ×2
HEMOSTAT ARISTA ABSORB 3G PWDR (HEMOSTASIS) ×1 IMPLANT
NDL HYPO 25X1 1.5 SAFETY (NEEDLE) ×1 IMPLANT
NEEDLE HYPO 25X1 1.5 SAFETY (NEEDLE) ×2 IMPLANT
NS IRRIG 1000ML POUR BTL (IV SOLUTION) ×1 IMPLANT
PACK BASIN DAY SURGERY FS (CUSTOM PROCEDURE TRAY) ×2 IMPLANT
PENCIL BUTTON HOLSTER BLD 10FT (ELECTRODE) ×2 IMPLANT
SLEEVE SCD COMPRESS KNEE MED (MISCELLANEOUS) ×2 IMPLANT
SPONGE INTESTINAL PEANUT (DISPOSABLE) IMPLANT
SPONGE LAP 4X18 RFD (DISPOSABLE) ×3 IMPLANT
SUT ETHILON 3 0 PS 1 (SUTURE) ×1 IMPLANT
SUT MON AB 4-0 PC3 18 (SUTURE) ×2 IMPLANT
SUT VIC AB 2-0 SH 27 (SUTURE) ×2
SUT VIC AB 2-0 SH 27XBRD (SUTURE) ×1 IMPLANT
SUT VIC AB 3-0 54X BRD REEL (SUTURE) IMPLANT
SUT VIC AB 3-0 BRD 54 (SUTURE)
SUT VICRYL 3-0 CR8 SH (SUTURE) ×2 IMPLANT
SUT VICRYL AB 2 0 TIE (SUTURE) IMPLANT
SUT VICRYL AB 2 0 TIES (SUTURE)
SYR CONTROL 10ML LL (SYRINGE) ×2 IMPLANT
TOWEL GREEN STERILE FF (TOWEL DISPOSABLE) ×2 IMPLANT
TUBE CONNECTING 20X1/4 (TUBING) ×1 IMPLANT
YANKAUER SUCT BULB TIP NO VENT (SUCTIONS) ×1 IMPLANT

## 2018-09-16 NOTE — Interval H&P Note (Signed)
History and Physical Interval Note:  09/16/2018 10:00 AM  Richard [name]  has presented today for surgery, with the diagnosis of ENLARGED LYMPH NODE  The various methods of treatment have been discussed with the patient and family. After consideration of risks, benefits and other options for treatment, the patient has consented to  Procedure(s): LEFT INGUINAL LYMPH NODE BIOPSY (Left) as a surgical intervention .  The patient's history has been reviewed, patient examined, no change in status, stable for surgery.  I have reviewed the patient's chart and labs.  Questions were answered to the patient's satisfaction.     [place]

## 2018-09-16 NOTE — Discharge Instructions (Signed)
Surgical Drain Home Care °Surgical drains are used to remove extra fluid that normally builds up in a surgical wound after surgery. A surgical drain helps to heal a surgical wound. Different kinds of surgical drains include: °· Active drains. These drains use suction to pull drainage away from the surgical wound. Drainage flows through a tube to a container outside of the body. It is important to keep the bulb or the drainage container flat (compressed) at all times, except while you empty it. Flattening the bulb or container creates suction. The two most common types of active drains are bulb drains and Hemovac drains. °· Passive drains. These drains allow fluid to drain naturally, by gravity. Drainage flows through a tube to a bandage (dressing) or a container outside of the body. Passive drains do not need to be emptied. The most common type of passive drain is the Penrose drain. °A drain is placed during surgery. Immediately after surgery, drainage is usually bright red and a little thicker than water. The drainage may gradually turn yellow or pink and become thinner. It is likely that your health care provider will remove the drain when the drainage stops or when the amount decreases to 1-2 Tbsp (15-30 mL) during a 24-hour period. °How to care for your surgical drain °It is important to care for your drain to prevent infection. If your drain is placed at your back, or any other hard-to-reach area, ask another person to assist you in performing the following steps: °· Keep the skin around the drain dry and covered with a dressing at all times. °· Check your drain area every day for signs of infection. Check for: °? More redness, swelling, or pain. °? Pus or a bad smell. °? Cloudy drainage. °Follow instructions from your health care provider about how to take care of your drain and how to change your dressing. Change your dressing at least one time every day. Change it more often if needed to keep the dressing  dry. Make sure you: °1. Gather your supplies, including: °? Tape. °? Germ-free cleaning solution (sterile saline). °? Split gauze drain sponge: 4 x 4 inches (10 x 10 cm). °? Gauze square: 4 x 4 inches (10 x 10 cm). °2. Wash your hands with soap and water before you change your dressing. If soap and water are not available, use hand sanitizer. °3. Remove the old dressing. Avoid using scissors to do that. °4. Use sterile saline to clean your skin around the drain. °5. Place the tube through the slit in a drain sponge. Place the drain sponge so that it covers your wound. °6. Place the gauze square or another drain sponge on top of the drain sponge that is on the wound. Make sure the tube is between those layers. °7. Tape the dressing to your skin. °8. If you have an active bulb or Hemovac drain, tape the drainage tube to your skin 1-2 inches (2.5-5 cm) below the place where the tube enters your body. Taping keeps the tube from pulling on any stitches (sutures) that you have. °9. Wash your hands with soap and water. °10. Write down the color of your drainage and how often you change your dressing. °How to empty your active bulb or Hemovac drain ° °1. Make sure that you have a measuring cup that you can empty your drainage into. °2. Wash your hands with soap and water. If soap and water are not available, use hand sanitizer. °3. Gently move your fingers down the   tube while squeezing very lightly. This is called stripping the tube. This clears any drainage, clots, or tissue from the tube. ? Do not pull on the tube. ? You may need to strip the tube several times every day to keep the tube clear. 4. Open the bulb cap or the drain plug. Do not touch the inside of the cap or the bottom of the plug. 5. Empty all of the drainage into the measuring cup. 6. Compress the bulb or the container and replace the cap or the plug. To compress the bulb or the container, squeeze it firmly in the middle while you close the cap or plug  the container. 7. Write down the amount of drainage that you have in each 24-hour period. If you have less than 2 Tbsp (30 mL) of drainage during 24 hours, contact your health care provider. 8. Flush the drainage down the toilet. 9. Wash your hands with soap and water. Contact a health care provider if:  You have more redness, swelling, or pain around your drain area.  The amount of drainage that you have is increasing instead of decreasing.  You have pus or a bad smell coming from your drain area.  You have a fever.  You have drainage that is cloudy.  There is a sudden stop or a sudden decrease in the amount of drainage that you have.  Your tube falls out.  Your active draindoes not stay compressedafter you empty it. Summary  Surgical drains are used to remove extra fluid that normally builds up in a surgical wound after surgery.  Different kinds of surgical drains include active drains and passive drains. Active drains use suction to pull drainage away from the surgical wound, and passive drains allow fluid to drain naturally.  It is important to care for your drain to prevent infection. If your drain is placed at your back, or any other hard-to-reach area, ask another person to assist you.  Contact your health care provider if you have more redness, swelling, or pain around your drain area. This information is not intended to replace advice given to you by your health care provider. Make sure you discuss any questions you have with your health care provider. Document Released: 07/13/2000 Document Revised: 08/08/2017 Document Reviewed: 02/02/2015 Elsevier Interactive Patient Education  2019 [place] Instructions  NO TYLENOL UNTIL AFTER 3:00PM.  Activity: Get plenty of rest for the remainder of the day. A responsible individual must stay with you for 24 hours following the procedure.  For the next 24 hours, DO NOT: -Drive a  car -Paediatric nurse -Drink alcoholic beverages -Take any medication unless instructed by your physician -Make any legal decisions or sign important papers.  Meals: Start with liquid foods such as gelatin or soup. Progress to regular foods as tolerated. Avoid greasy, spicy, heavy foods. If nausea and/or vomiting occur, drink only clear liquids until the nausea and/or vomiting subsides. Call your physician if vomiting continues.  Special Instructions/Symptoms: Your throat may feel dry or sore from the anesthesia or the breathing tube placed in your throat during surgery. If this causes discomfort, gargle with warm salt water. The discomfort should disappear within 24 hours.  If you had a scopolamine patch placed behind your ear for the management of post- operative nausea and/or vomiting:  1. The medication in the patch is effective for 72 hours, after which it should be removed.  Wrap patch in a tissue and discard in  the trash. Wash hands thoroughly with soap and water. 2. You may remove the patch earlier than 72 hours if you experience unpleasant side effects which may include dry mouth, dizziness or visual disturbances. 3. Avoid touching the patch. Wash your hands with soap and water after contact with the patch.

## 2018-09-16 NOTE — Op Note (Signed)
Preoperative diagnosis: Left groin lymphadenopathy  Postoperative diagnosis: Same  Procedure: Left femoral/inguinal lymph node biopsy deep  Surgeon: Erroll Luna, MD  Anesthesia: LMA with local consisting of 0.25% Sensorcaine local with epinephrine  EBL: 60 cc  Drains: 19 round Blake drain  IV fluids: Per anesthesia record  Indications for procedure: The patient 73 year old male diagnosis with possible lymphoproliferative disease after being referred for lymph node biopsy.  Core biopsy was done initially which showed lymphoproliferative disorder.  He presents today for definitive lymph node biopsy for further evaluation and or treatment.The procedure has been discussed with the patient.  Alternative therapies have been discussed with the patient.  Operative risks include bleeding,  Infection,  Organ injury,  Nerve injury,  Blood vessel injury,  DVT,  Pulmonary embolism,  Death,  And possible reoperation.  Medical management risks include worsening of present situation.  The success of the procedure is 50 -90 % at treating patients symptoms.  The patient understands and agrees to proceed.   Description of procedure: The patient was met in the holding area and questions answered.  He was then taken back the operative room after marking the left side of the correct side.  He was placed supine upon the operating table.  After induction of LMA esthesia, his left groin was prepped and draped in sterile fashion.  Timeout was done.  Proper side and procedure were verified.  The mass was quite large and easily palpable.  Incision made over the left groin mass.  Dissection was carried down this was actually the femoral canal.  I carefully dissected the very large lymph node.  This was at least 6 to 7 cm in maximal diameter.  Care was taken to dissected away from the femoral vessels and the femoral nerve.  This was done quite slowly and carefully to prevent injury.  I was able to dissect down to the Bobi  lymph node and left a small part of the node behind since it was adherent to the deep vessels.  The node was amputated carefully.  Use small clips to clip any small perforating lymphatics or small venules.  The femoral artery and vein were easily palpable and were out of the operative field.  The femoral nerve was lateral.  Hemostasis was achieved.  Specimen sent to pathology.  This was a large cavity therefore through a separate stab stab incision in 19 round drain was placed.  Arista was placed in the cavity for hemostasis.  There was some residual oozing from the tuft of lymph node left behind and this was cauterized.  After ensuring hemostasis the wound was closed with a deep layer of 3-0 Vicryl and 4-0 Monocryl.  Dermabond applied.  All final counts were found to be correct.  The patient was awoke extubated taken to recovery in satisfactory condition.

## 2018-09-16 NOTE — H&P (Signed)
Richard [name] is an 73 y.o. male.   Chief Complaint: Inguinal lymphadenopathy HPI: Patient presents today for left inguinal lymph node biopsy secondary to adenopathy noted on previous examination.  Core biopsy showed lymphoid proliferation concerning for lymphoma.  Lymph node biopsy necessary for definitive diagnosis.  He denies any weight loss, night sweats or chronic cough.  Past Medical History:  Diagnosis Date  . Aneurysm ([place])    left common iliac-stable  . Arthritis   . Fasting hyperglycemia   . History of kidney stones   . Hyperlipidemia    LDL goal = < 100 based on NMR Lipoproprofile  . Hypertension   . Mass of left lower extremity    inner thigh  . Nephrolithiasis    left   . Renal insufficiency   . Sepsis ([place]) 03/24/2013  . Squamous cell carcinoma, face    Dr Sarajane Jews  . Ureteral stone with hydronephrosis 03/24/2013   left    Past Surgical History:  Procedure Laterality Date  . CATARACT EXTRACTION  2005   OD   . COLONOSCOPY W/ POLYPECTOMY  2007   adenoma X2 ;  GI  . CYSTOSCOPY W/ URETERAL STENT PLACEMENT Left 03/23/2013   Procedure: CYSTOSCOPY WITH RETROGRADE PYELOGRAM/URETERAL STENT PLACEMENT;  Surgeon: Fredricka Bonine, MD;  Location: [facility];  Service: Urology;  Laterality: Left;  . CYSTOSCOPY WITH RETROGRADE PYELOGRAM, URETEROSCOPY AND STENT PLACEMENT Bilateral 07/15/2018   Procedure: CYSTOSCOPY WITH RETROGRADE PYELOGRAM, URETEROSCOPY;  Surgeon: Irine Seal, MD;  [facility];  Service: Urology;  Laterality: Bilateral;  . CYSTOSCOPY WITH STENT PLACEMENT Left 07/15/2018   Procedure: CYSTOSCOPY WITH STENT PLACEMENT;  Surgeon: Irine Seal, MD;  [facility];  Service: Urology;  Laterality: Left;  . CYSTOSCOPY WITH URETEROSCOPY  02/28/2012   Procedure: CYSTOSCOPY WITH URETEROSCOPY;  Surgeon: Malka So, MD;  [facility];  Service: Urology;  Laterality: Left;  . CYSTOSCOPY/URETEROSCOPY/HOLMIUM LASER/STENT PLACEMENT Right 07/31/2018   Procedure: CYSTOSCOPY STENT REMOVAL  RIGHT URETEROSCOPY WITH HOLMIUM LASER POSSIBLE STENT PLACEMENT;  Surgeon: Irine Seal, MD;  [facility]: [facility];  Service: Urology;  Laterality: Right;  . HOLMIUM LASER APPLICATION Left 09/03/537   Procedure: HOLMIUM LASER APPLICATION;  Surgeon: Malka So, MD;  [facility];  Service: Urology;  Laterality: Left;  . HOLMIUM LASER APPLICATION Right 76/73/4193   Procedure: HOLMIUM LASER APPLICATION;  Surgeon: Irine Seal, MD;  [facility];  Service: Urology;  Laterality: Right;  . LITHOTRIPSY      X2 w/o benefit  . NEPHROLITHOTOMY  02/28/2012   Procedure: NEPHROLITHOTOMY PERCUTANEOUS;  Surgeon: Malka So, MD;  [facility];  Service: Urology;  Laterality: Left;  . TONSILLECTOMY    . URETEROSCOPY Left 03/31/2013   Procedure: URETEROSCOPY;  Surgeon: Malka So, MD;  [facility];  Service: Urology;  Laterality: Left;  Marland Kitchen VASECTOMY      Family History  Problem Relation Age of Onset  . Heart attack Father 35        CBAG X5 vessels  . Hyperlipidemia Father   . Hypertension Father   . Heart disease Father        before age 20  . Hypertension Brother   . Aortic aneurysm Paternal Uncle        AAA ruptured  . Heart disease Mother   . Hyperlipidemia Mother   . Hypertension Mother   . Diabetes Neg Hx    Social History:  reports that he has never smoked. He has never  used smokeless tobacco. He reports current alcohol use. He reports that he does not use drugs.  Allergies:  Allergies  Allergen Reactions  . Angiotensin Receptor Blockers     Angioedema with ACE-I  . Ciprofloxacin     Rash on combination of Cipro & Pyridium after cystoscopic & open resection of renal calculi  . Pyridium [Phenazopyridine]     In combo with Cipro    Medications Prior to Admission  Medication Sig Dispense Refill  . amLODipine (NORVASC) 5 MG tablet TAKE 1 TABLET BY MOUTH EVERY DAY (Patient taking differently: Take 5 mg by mouth daily. )  90 tablet 3  . atorvastatin (LIPITOR) 20 MG tablet Take 1 tablet (20 mg total) by mouth See admin instructions. Take 20 mg by mouth on Monday, Wednesday, Friday and Sunday 51 tablet 3  . metoprolol tartrate (LOPRESSOR) 25 MG tablet TAKE 1 TABLET BY MOUTH TWICE A DAY (Patient taking differently: Take 25 mg by mouth 2 (two) times daily. ) 180 tablet 3    No results found for this or any previous visit (from the past 48 hour(s)). No results found.  Review of Systems  All other systems reviewed and are negative.   Blood pressure (!) 142/89, temperature 97.9 F (36.6 C), temperature source Oral, height 6\' 1"  (1.854 m), weight 96 kg, SpO2 100 %. Physical Exam  Constitutional: He is oriented to person, place, and time. He appears well-developed and well-nourished.  HENT:  Head: Normocephalic.  Eyes: Pupils are equal, round, and reactive to light.  Neck: Normal range of motion.  Cardiovascular: Normal rate.  Respiratory: Effort normal.  GI: Soft. He exhibits no distension. There is no abdominal tenderness.  Genitourinary:    Testes and penis normal.   Lymphadenopathy:       Left: Inguinal adenopathy present.  Neurological: He is alert and oriented to person, place, and time.  Skin: Skin is warm and dry.     Assessment/Plan Left inguinal adenopathy deep  Left inguinal lymph node biopsy for definitive diagnosis.  Referral to oncology depending on results.  The procedure discussed and the rationale for surgery discussed.  Risk of bleeding, infection, lymphedema, wound complications, seroma, blood vessel injury, nerve injury, limb injury, the need further procedures and surgeries discussed.The procedure has been discussed with the patient.  Alternative therapies have been discussed with the patient.  Operative risks include bleeding,  Infection,  Organ injury,  Nerve injury,  Blood vessel injury,  DVT,  Pulmonary embolism,  Death,  And possible reoperation.  Medical management risks include  worsening of present situation.  The success of the procedure is 50 -90 % at treating patients symptoms.  The patient understands and agrees to proceed.  Turner Daniels, MD 09/16/2018, 9:57 AM

## 2018-09-16 NOTE — Anesthesia Procedure Notes (Signed)
Procedure Name: LMA Insertion Date/Time: 09/16/2018 10:25 AM Performed by: Jonna Munro, CRNA Pre-anesthesia Checklist: Patient identified, Emergency Drugs available, Suction available, Patient being monitored and Timeout performed Patient Re-evaluated:Patient Re-evaluated prior to induction Oxygen Delivery Method: Circle system utilized Preoxygenation: Pre-oxygenation with 100% oxygen Induction Type: IV induction LMA: LMA inserted LMA Size: 4.0 Number of attempts: 1 Placement Confirmation: positive ETCO2 and breath sounds checked- equal and bilateral Tube secured with: Tape Dental Injury: Teeth and Oropharynx as per pre-operative assessment

## 2018-09-16 NOTE — Anesthesia Preprocedure Evaluation (Addendum)
Anesthesia Evaluation  Patient identified by MRN, date of birth, ID band Patient awake    Reviewed: Allergy & Precautions, NPO status , Patient's Chart, lab work & pertinent test results  History of Anesthesia Complications Negative for: history of anesthetic complications  Airway Mallampati: II  TM Distance: >3 FB Neck ROM: Full    Dental  (+) Dental Advisory Given, Chipped,    Pulmonary neg pulmonary ROS,    Pulmonary exam normal breath sounds clear to auscultation       Cardiovascular hypertension, Pt. on medications and Pt. on home beta blockers Normal cardiovascular exam Rhythm:Regular Rate:Normal     Neuro/Psych negative neurological ROS     GI/Hepatic negative GI ROS, Neg liver ROS,   Endo/Other  negative endocrine ROS  Renal/GU Renal InsufficiencyRenal disease     Musculoskeletal  (+) Arthritis ,   Abdominal   Peds  Hematology negative hematology ROS (+)   Anesthesia Other Findings Day of surgery medications reviewed with the patient.  Reproductive/Obstetrics                            Anesthesia Physical Anesthesia Plan  ASA: II  Anesthesia Plan: General   Post-op Pain Management:    Induction: Intravenous  PONV Risk Score and Plan: 2 and Treatment may vary due to age or medical condition, Ondansetron and Dexamethasone  Airway Management Planned: LMA  Additional Equipment:   Intra-op Plan:   Post-operative Plan: Extubation in OR  Informed Consent: I have reviewed the patients History and Physical, chart, labs and discussed the procedure including the risks, benefits and alternatives for the proposed anesthesia with the patient or authorized representative who has indicated his/her understanding and acceptance.     Dental advisory given  Plan Discussed with: CRNA  Anesthesia Plan Comments:        Anesthesia Quick Evaluation

## 2018-09-16 NOTE — Transfer of Care (Signed)
Immediate Anesthesia Transfer of Care Note  Patient: Richard [name]  Procedure(s) Performed: LEFT INGUINAL LYMPH NODE BIOPSY (Left Groin)  Patient Location: PACU  Anesthesia Type:General  Level of Consciousness: awake, alert  and oriented  Airway & Oxygen Therapy: Patient Spontanous Breathing and Patient connected to face mask oxygen  Post-op Assessment: Report given to RN and Post -op Vital signs reviewed and stable  Post vital signs: Reviewed and stable  Last Vitals:  Vitals Value Taken Time  BP    Temp    Pulse    Resp    SpO2      Last Pain:  Vitals:   09/16/18 0855  TempSrc: Oral  PainSc: 0-No pain         Complications: No apparent anesthesia complications

## 2018-09-16 NOTE — Anesthesia Postprocedure Evaluation (Signed)
Anesthesia Post Note  Patient: Richard [name]  Procedure(s) Performed: LEFT INGUINAL LYMPH NODE BIOPSY (Left Groin)     Patient location during evaluation: PACU Anesthesia Type: General Level of consciousness: awake and alert Pain management: pain level controlled Vital Signs Assessment: post-procedure vital signs reviewed and stable Respiratory status: spontaneous breathing, nonlabored ventilation and respiratory function stable Cardiovascular status: blood pressure returned to baseline and stable Postop Assessment: no apparent nausea or vomiting Anesthetic complications: no    Last Vitals:  Vitals:   09/16/18 1230 09/16/18 1245  BP: 127/85 125/83  Pulse: 66 62  Resp: 17 20  Temp:  36.5 C  SpO2: 96% 96%    Last Pain:  Vitals:   09/16/18 1245  TempSrc:   PainSc: 0-No pain                 Brennan Bailey

## 2018-09-17 ENCOUNTER — Encounter (HOSPITAL_BASED_OUTPATIENT_CLINIC_OR_DEPARTMENT_OTHER): Payer: Self-pay | Admitting: Surgery

## 2018-09-26 ENCOUNTER — Telehealth: Payer: Self-pay | Admitting: Hematology

## 2018-09-26 NOTE — Progress Notes (Signed)
HEMATOLOGY/ONCOLOGY CONSULTATION NOTE  Date of Service: 09/29/2018  Patient Care Team: Vivi Barrack, MD as PCP - General (Family Medicine)  CHIEF COMPLAINTS/PURPOSE OF CONSULTATION:  Newly diagnosed follicular lymphoma  HISTORY OF PRESENTING ILLNESS:   Richard [name] is a wonderful 73 y.o. male who has been referred to [place] by Dr. Brantley Stage for evaluation and management of low grade follicular lymphoma. He is being accompanied by his wife, Richard [name] today. He notes that he first noticed a lump to his left inner thigh during a shower approximately 3-4 years ago and noted that it increased in size over the past couple of months.   He had an US of the LLE on 07/16/2018 showed: Soft tissue masses in the left inguinal region most compatible with enlarged lymph nodes/adenopathy, the largest measuring up to 6.1 cm.  On 08/20/2018, the patient underwent cytometry that showed: Tissue-Flow Cytometry with monoclonal B-cell population identified.   Lymph node biopsy on 08/20/2018 showed: Lymph node, needle/core biopsy, Left Inguinal with atypical lymphoid proliferation.   On 09/16/2018, the patient had biopsy completed with pathology showing: Lymph node for lymphoma, left inguinal with low grade follicular lymphoma.   PMHx, he has a hx of squamous cell carcinoma to bilateral sides of his face, none that is active.   SHx, he has had several cystoscopies within the past 4 months and his urologist is   On review of systems, he reports additional left inguinal lump x 4-5 months, intentional weight loss, recurrent kidney stones (most recently December 2019). he denies fever, chills, night sweats, no other areas of concerns, leg swelling, abdominal pain, flank pain, testicular pain/swelling, rashes, and any other symptoms. He hasn't had his prior stones analyzed to determine the cause of them. He has intentionally lost 45 lbs in the past couple of weeks through keto diet and intermittent fasting. He doesn't  consume bread or pasta. He works out with [employer] about every other day. His PCP is Vivi Barrack, MD and his Urologist is Dr. Irine Seal. Pertinent positives are listed and detailed within the above HPI.    MEDICAL HISTORY:  Past Medical History:  Diagnosis Date  . Aneurysm ([place])    left common iliac-stable  . Arthritis   . Fasting hyperglycemia   . History of kidney stones   . Hyperlipidemia    LDL goal = < 100 based on NMR Lipoproprofile  . Hypertension   . Mass of left lower extremity    inner thigh  . Nephrolithiasis    left   . Renal insufficiency   . Sepsis ([place]) 03/24/2013  . Squamous cell carcinoma, face    Dr Sarajane Jews  . Ureteral stone with hydronephrosis 03/24/2013   left    SURGICAL HISTORY: Past Surgical History:  Procedure Laterality Date  . CATARACT EXTRACTION  2005   OD   . COLONOSCOPY W/ POLYPECTOMY  2007   adenoma X2 ; [facility]  . CYSTOSCOPY W/ URETERAL STENT PLACEMENT Left 03/23/2013   Procedure: CYSTOSCOPY WITH RETROGRADE PYELOGRAM/URETERAL STENT PLACEMENT;  Surgeon: Fredricka Bonine, MD;  [facility];  Service: Urology;  Laterality: Left;  . CYSTOSCOPY WITH RETROGRADE PYELOGRAM, URETEROSCOPY AND STENT PLACEMENT Bilateral 07/15/2018   Procedure: CYSTOSCOPY WITH RETROGRADE PYELOGRAM, URETEROSCOPY;  Surgeon: Irine Seal, MD;  [facility];  Service: Urology;  Laterality: Bilateral;  . CYSTOSCOPY WITH STENT PLACEMENT Left 07/15/2018   Procedure: CYSTOSCOPY WITH STENT PLACEMENT;  Surgeon: Irine Seal, MD;  [facility];  Service: Urology;  Laterality:  Left;  . CYSTOSCOPY WITH URETEROSCOPY  02/28/2012   Procedure: CYSTOSCOPY WITH URETEROSCOPY;  Surgeon: Malka So, MD;  [facility];  Service: Urology;  Laterality: Left;  . CYSTOSCOPY/URETEROSCOPY/HOLMIUM LASER/STENT PLACEMENT Right 07/31/2018   Procedure: CYSTOSCOPY STENT REMOVAL  RIGHT URETEROSCOPY WITH HOLMIUM LASER POSSIBLE STENT PLACEMENT;  Surgeon: Irine Seal, MD;  Location:  [facility];  Service: Urology;  Laterality: Right;  . HOLMIUM LASER APPLICATION Left 04/08/3715   Procedure: HOLMIUM LASER APPLICATION;  Surgeon: Malka So, MD;  [facility];  Service: Urology;  Laterality: Left;  . HOLMIUM LASER APPLICATION Right 96/78/9381   Procedure: HOLMIUM LASER APPLICATION;  Surgeon: Irine Seal, MD;  [facility];  Service: Urology;  Laterality: Right;  . INGUINAL LYMPH NODE BIOPSY Left 09/16/2018   Procedure: LEFT INGUINAL LYMPH NODE BIOPSY;  Surgeon: Erroll Luna, MD;  Location: [facility];  Service: General;  Laterality: Left;  . LITHOTRIPSY      X2 w/o benefit  . NEPHROLITHOTOMY  02/28/2012   Procedure: NEPHROLITHOTOMY PERCUTANEOUS;  Surgeon: Malka So, MD;  [facility];  Service: Urology;  Laterality: Left;  . TONSILLECTOMY    . URETEROSCOPY Left 03/31/2013   Procedure: URETEROSCOPY;  Surgeon: Malka So, MD;  [facility];  Service: Urology;  Laterality: Left;  Marland Kitchen VASECTOMY      SOCIAL HISTORY: Social History   Socioeconomic History  . Marital status: Married    Spouse name: Not on file  . Number of children: 2  . Years of education: 35  . Highest education level: Not on file  Occupational History  . 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 Financial resource strain: Not on file  . Food insecurity:    Worry: Not on file    Inability: Not on file  . Transportation needs:    Medical: Not on file    Non-medical: Not on file  Tobacco Use  . Smoking status: Never Smoker  . Smokeless tobacco: Never Used  Substance and Sexual Activity  . Alcohol use: Yes    Comment:  rarely  . Drug use: No  . Sexual activity: Not on file  Lifestyle  . Physical activity:    Days per week: Not on file    Minutes per session: Not on file  . Stress: Not on file  Relationships  . Social connections:    Talks on phone: Not on file    Gets together: Not on file     Attends religious service: Not on file    Active member of club or organization: Not on file    Attends meetings of clubs or organizations: Not on file    Relationship status: Not on file  . Intimate partner violence:    Fear of current or ex partner: Not on file    Emotionally abused: Not on file    Physically abused: Not on file    Forced sexual activity: Not on file  Other Topics Concern  . Not on file  Social History Narrative   Fun: Work around [employer] and work out.     FAMILY HISTORY: Family History  Problem Relation Age of Onset  . Heart attack Father 10        CBAG X5 vessels  . Hyperlipidemia Father   . Hypertension Father   . Heart disease Father        before age 54  . Hypertension Brother   .  Aortic aneurysm Paternal Uncle        AAA ruptured  . Heart disease Mother   . Hyperlipidemia Mother   . Hypertension Mother   . Diabetes Neg Hx     ALLERGIES:  is allergic to angiotensin receptor blockers; ciprofloxacin; and pyridium [phenazopyridine].  MEDICATIONS:  Current Outpatient Medications  Medication Sig Dispense Refill  . amLODipine (NORVASC) 5 MG tablet TAKE 1 TABLET BY MOUTH EVERY DAY (Patient taking differently: Take 5 mg by mouth daily. ) 90 tablet 3  . atorvastatin (LIPITOR) 20 MG tablet Take 1 tablet (20 mg total) by mouth See admin instructions. Take 20 mg by mouth on Monday, Wednesday, Friday and Sunday 51 tablet 3  . HYDROcodone-acetaminophen (NORCO/VICODIN) 5-325 MG tablet Take 1 tablet by mouth every 6 (six) hours as needed for moderate pain. 12 tablet 0  . metoprolol tartrate (LOPRESSOR) 25 MG tablet TAKE 1 TABLET BY MOUTH TWICE A DAY (Patient taking differently: Take 25 mg by mouth 2 (two) times daily. ) 180 tablet 3   No current facility-administered medications for this visit.     REVIEW OF SYSTEMS:    10 Point review of Systems was done is negative except as noted above.  PHYSICAL EXAMINATION:  . Vitals:   09/29/18 1338  BP: 136/87    Pulse: 60  Resp: 18  Temp: 97.7 F (36.5 C)  SpO2: 100%   Filed Weights   09/29/18 1338  Weight: 200 lb 8 oz (90.9 kg)   .Body mass index is 26.45 kg/m.  GENERAL:alert, in no acute distress and comfortable SKIN: no acute rashes, no significant lesions EYES: conjunctiva are pink and non-injected, sclera anicteric OROPHARYNX: MMM, no exudates, no oropharyngeal erythema or ulceration NECK: supple, no JVD LYMPH:  no palpable lymphadenopathy in the cervical, axillary regions LUNGS: clear to auscultation b/l with normal respiratory effort HEART: regular rate & rhythm ABDOMEN:  normoactive bowel sounds , non tender, not distended. GU: Small seroma to left inguinal area where surgery was completed.  Extremity: no pedal edema PSYCH: alert & oriented x 3 with fluent speech NEURO: no focal motor/sensory deficits  LABORATORY DATA:  I have reviewed the data as listed  . CBC Latest Ref Rng & Units 09/29/2018 09/11/2018 08/20/2018  WBC 4.0 - 10.5 K/uL 7.5 6.6 7.3  Hemoglobin 13.0 - 17.0 g/dL 13.7 14.0 13.7  Hematocrit 39.0 - 52.0 % 44.3 43.8 45.0  Platelets 150 - 400 K/uL 206 182 186    . CMP Latest Ref Rng & Units 09/29/2018 09/11/2018 07/31/2018  Glucose 70 - 99 mg/dL 95 95 96  BUN 8 - 23 mg/dL 27(H) 25(H) 32(H)  Creatinine 0.61 - 1.24 mg/dL 1.58(H) 1.64(H) 1.72(H)  Sodium 135 - 145 mmol/L 142 139 142  Potassium 3.5 - 5.1 mmol/L 4.3 4.9 4.1  Chloride 98 - 111 mmol/L 105 103 -  CO2 22 - 32 mmol/L 27 29 -  Calcium 8.9 - 10.3 mg/dL 9.0 9.1 -  Total Protein 6.5 - 8.1 g/dL 6.6 6.1(L) -  Total Bilirubin 0.3 - 1.2 mg/dL 0.6 0.9 -  Alkaline Phos 38 - 126 U/L 62 47 -  AST 15 - 41 U/L 14(L) 19 -  ALT 0 - 44 U/L 11 17 -      RADIOGRAPHIC STUDIES: I have personally reviewed the radiological images as listed and agreed with the findings in the report. No results found.  ASSESSMENT & PLAN:   Low-grade follicular lymphoma -First noticed a left inner thigh lump approximately 3-4 years ago  and noted that it increased in size over the past couple of months.  -He had an US of the LLE on 07/16/2018 showed: Soft tissue masses in the left inguinal region most compatible with enlarged lymph nodes/adenopathy, the largest measuring up to 6.1 cm. -On 08/20/2018, the patient underwent cytometry that showed: Tissue-Flow Cytometry with monoclonal B-cell population identified.  -Lymph node biopsy on 08/20/2018 showed: Lymph node, needle/core biopsy, Left Inguinal with atypical lymphoid proliferation.  -On 09/16/2018, the patient had a biopsy completed with pathology showing: Lymph node for lymphoma, left inguinal with low grade follicular lymphoma.    Plan:  -Discussed patients' recent biopsy results with him and his wife today.  -Discussed with the patient and his wife that due to this being low-grade, it is often observed rather than going the route of chemo or immunotherapy.  -Discussed that the next steps would be a PET/CT scan to determine if there are any other areas of concerns.  -If localized, then we will treat with local radiation. However, if it is broadly spread and the patient is symptomatic, then we will have further discussion regarding treatment options.  -Also spoke with the patient and his wife regarding the next steps of labs and PET/CT -Once the results return, I will provide the patient with the NCCN guidelines and we will have further discussion regarding next steps.   Labs today PET/CT in 1 week RTC with Dr Irene Limbo in 2 weeks  All of the patients questions were answered with apparent satisfaction. The patient knows to call the clinic with any problems, questions or concerns.  I spent 40 minutes counseling the patient face to face. The total time spent in the appointment was 60 minutes and more than 50% was on counseling and direct patient cares.    Sullivan Lone MD MS AAHIVMS [facility] Hematology/Oncology Physician [facility] [place] [facility]  (Office):        [phone] (Work cell):  [phone] (Fax):           [phone]  09/29/2018 2:13 PM    I, Soijett Blue am acting as scribe for Dr. Sullivan Lone.  .I have reviewed the above documentation for accuracy and completeness, and I agree with the above. Brunetta Genera MD

## 2018-09-26 NOTE — Telephone Encounter (Signed)
A new patient appt has been scheduled for the pt to see Dr. Irene Limbo on 3/2 at 140pm. Pt has been cld and made aware of the appt date and time.

## 2018-09-29 ENCOUNTER — Inpatient Hospital Stay: Payer: Medicare Other | Attending: Hematology | Admitting: Hematology

## 2018-09-29 ENCOUNTER — Telehealth: Payer: Self-pay | Admitting: Hematology

## 2018-09-29 ENCOUNTER — Inpatient Hospital Stay: Payer: Medicare Other

## 2018-09-29 VITALS — BP 136/87 | HR 60 | Temp 97.7°F | Resp 18 | Ht 73.0 in | Wt 200.5 lb

## 2018-09-29 DIAGNOSIS — I10 Essential (primary) hypertension: Secondary | ICD-10-CM | POA: Diagnosis not present

## 2018-09-29 DIAGNOSIS — E785 Hyperlipidemia, unspecified: Secondary | ICD-10-CM | POA: Diagnosis not present

## 2018-09-29 DIAGNOSIS — Z79899 Other long term (current) drug therapy: Secondary | ICD-10-CM | POA: Diagnosis not present

## 2018-09-29 DIAGNOSIS — C8295 Follicular lymphoma, unspecified, lymph nodes of inguinal region and lower limb: Secondary | ICD-10-CM | POA: Diagnosis not present

## 2018-09-29 LAB — CMP (CANCER CENTER ONLY)
ALT: 11 U/L (ref 0–44)
AST: 14 U/L — AB (ref 15–41)
Albumin: 3.9 g/dL (ref 3.5–5.0)
Alkaline Phosphatase: 62 U/L (ref 38–126)
Anion gap: 10 (ref 5–15)
BUN: 27 mg/dL — ABNORMAL HIGH (ref 8–23)
CO2: 27 mmol/L (ref 22–32)
Calcium: 9 mg/dL (ref 8.9–10.3)
Chloride: 105 mmol/L (ref 98–111)
Creatinine: 1.58 mg/dL — ABNORMAL HIGH (ref 0.61–1.24)
GFR, Est AFR Am: 51 mL/min — ABNORMAL LOW (ref >60)
GFR, Estimated: 44 mL/min — ABNORMAL LOW (ref >60)
Glucose, Bld: 95 mg/dL (ref 70–99)
Potassium: 4.3 mmol/L (ref 3.5–5.1)
SODIUM: 142 mmol/L (ref 135–145)
Total Bilirubin: 0.6 mg/dL (ref 0.3–1.2)
Total Protein: 6.6 g/dL (ref 6.5–8.1)

## 2018-09-29 LAB — CBC WITH DIFFERENTIAL/PLATELET
Abs Immature Granulocytes: 0.02 10*3/uL (ref 0.00–0.07)
BASOS ABS: 0 10*3/uL (ref 0.0–0.1)
Basophils Relative: 1 %
Eosinophils Absolute: 0.4 10*3/uL (ref 0.0–0.5)
Eosinophils Relative: 5 %
HCT: 44.3 % (ref 39.0–52.0)
Hemoglobin: 13.7 g/dL (ref 13.0–17.0)
IMMATURE GRANULOCYTES: 0 %
Lymphocytes Relative: 19 %
Lymphs Abs: 1.4 10*3/uL (ref 0.7–4.0)
MCH: 27 pg (ref 26.0–34.0)
MCHC: 30.9 g/dL (ref 30.0–36.0)
MCV: 87.4 fL (ref 80.0–100.0)
Monocytes Absolute: 0.6 10*3/uL (ref 0.1–1.0)
Monocytes Relative: 8 %
NRBC: 0 % (ref 0.0–0.2)
Neutro Abs: 5.1 10*3/uL (ref 1.7–7.7)
Neutrophils Relative %: 67 %
Platelets: 206 10*3/uL (ref 150–400)
RBC: 5.07 MIL/uL (ref 4.22–5.81)
RDW: 14.7 % (ref 11.5–15.5)
WBC: 7.5 10*3/uL (ref 4.0–10.5)

## 2018-09-29 LAB — LACTATE DEHYDROGENASE: LDH: 133 U/L (ref 98–192)

## 2018-09-29 NOTE — Telephone Encounter (Signed)
Scheduled appt per 3/2 los. ° °Printed calendar and avs. ° °Gave patient the number to central radiology. °

## 2018-09-30 LAB — HEPATITIS B SURFACE ANTIGEN: HEP B S AG: NEGATIVE

## 2018-09-30 LAB — HEPATITIS C ANTIBODY: HCV Ab: <0.1 s/co ratio (ref 0.0–0.9)

## 2018-09-30 LAB — HEPATITIS B CORE ANTIBODY, TOTAL: Hep B Core Total Ab: NEGATIVE

## 2018-10-08 ENCOUNTER — Encounter (HOSPITAL_COMMUNITY)
Admission: RE | Admit: 2018-10-08 | Discharge: 2018-10-08 | Disposition: A | Discharge status: Home / Self Care | Payer: Medicare Other | Source: Ambulatory Visit | Attending: Hematology | Admitting: Hematology

## 2018-10-08 ENCOUNTER — Ambulatory Visit (HOSPITAL_COMMUNITY): Payer: Medicare Other

## 2018-10-08 ENCOUNTER — Other Ambulatory Visit: Payer: Self-pay

## 2018-10-08 DIAGNOSIS — C8295 Follicular lymphoma, unspecified, lymph nodes of inguinal region and lower limb: Secondary | ICD-10-CM

## 2018-10-08 DIAGNOSIS — I723 Aneurysm of iliac artery: Secondary | ICD-10-CM | POA: Insufficient documentation

## 2018-10-08 DIAGNOSIS — I700 Atherosclerosis of aorta: Secondary | ICD-10-CM | POA: Insufficient documentation

## 2018-10-08 DIAGNOSIS — I2510 Atherosclerotic heart disease of native coronary artery without angina pectoris: Secondary | ICD-10-CM | POA: Diagnosis not present

## 2018-10-08 LAB — GLUCOSE, CAPILLARY: Glucose-Capillary: 89 mg/dL (ref 70–99)

## 2018-10-08 MED ORDER — FLUDEOXYGLUCOSE F - 18 (FDG) INJECTION
10.3000 | Freq: Once | INTRAVENOUS | Status: AC | PRN
  Administered 2018-10-08: 10.3 via INTRAVENOUS

## 2018-10-10 NOTE — Progress Notes (Signed)
HEMATOLOGY/ONCOLOGY CONSULTATION NOTE  Date of Service: 10/13/2018  Patient Care Team: Vivi Barrack, MD as PCP - General (Family Medicine)  CHIEF COMPLAINTS/PURPOSE OF CONSULTATION:  F/u for follicular lymphoma  HISTORY OF PRESENTING ILLNESS:   Richard [name] is a wonderful 73 y.o. male who has been referred to [place] by Dr. Brantley Stage for evaluation and management of low grade follicular lymphoma. He is being accompanied by his wife, Velva Harman today. He notes that he first noticed a lump to his left inner thigh during a shower approximately 3-4 years ago and noted that it increased in size over the past couple of months.   He had an US of the LLE on 07/16/2018 showed: Soft tissue masses in the left inguinal region most compatible with enlarged lymph nodes/adenopathy, the largest measuring up to 6.1 cm.  On 08/20/2018, the patient underwent cytometry that showed: Tissue-Flow Cytometry with monoclonal B-cell population identified.   Lymph node biopsy on 08/20/2018 showed: Lymph node, needle/core biopsy, Left Inguinal with atypical lymphoid proliferation.   On 09/16/2018, the patient had biopsy completed with pathology showing: Lymph node for lymphoma, left inguinal with low grade follicular lymphoma.   PMHx, he has a hx of squamous cell carcinoma to bilateral sides of his face, none that is active.   SHx, he has had several cystoscopies within the past 4 months and his urologist is   On review of systems, he reports additional left inguinal lump x 4-5 months, intentional weight loss, recurrent kidney stones (most recently December 2019). he denies fever, chills, night sweats, no other areas of concerns, leg swelling, abdominal pain, flank pain, testicular pain/swelling, rashes, and any other symptoms. He hasn't had his prior stones analyzed to determine the cause of them. He has intentionally lost 45 lbs in the past couple of weeks through keto diet and intermittent fasting. He doesn't consume  bread or pasta. He works out with [employer] about every other day. His PCP is Vivi Barrack, MD and his Urologist is Dr. Irine Seal. Pertinent positives are listed and detailed within the above HPI.   Interval History:   Richard [name] returns today for management and evaluation of his Follicular Lymphoma. The patient's last visit with [place] was on 09/29/18. The pt reports that he is doing well overall.   The pt reports that he is no longer having any soreness in the area of his previous surgical biopsy. He notes that he can feel a couple small left inguinal lymph nodes and denies noticing any other lumps or bumps. He denies any pain or leg swelling. The pt denies developing any new concerns in the interim.  Of note since the patient's last visit, pt has had a PET/CT completed on 10/08/18 with results revealing Left inguinal and external iliac hypermetabolic adenopathy, consistent with active lymphoma. (Deauville 4). 2. No extrapelvic disease identified. 3. Coronary artery atherosclerosis. Aortic Atherosclerosis. 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  Lab results (09/29/18) of CBC w/diff and CMP is as follows: all values are WNL except for BUN at 27, Creatinine at 1.58, AST at 14, GFR at 44. 09/29/18 LDH at 133 09/29/18 Hep B, Hep C negative  On review of systems, pt reports good energy levels, eating well, and denies lymph node pain, leg swelling, noticing any other lumps or bumps, and any other symptoms.  MEDICAL HISTORY:  Past Medical History:  Diagnosis Date   Aneurysm ([place])    left common iliac-stable  Arthritis    Fasting hyperglycemia    History of kidney stones    Hyperlipidemia    LDL goal = < 100 based on NMR Lipoproprofile   Hypertension    Mass of left lower extremity    inner thigh   Nephrolithiasis    left    Renal insufficiency    Sepsis ([place]) 03/24/2013   Squamous cell carcinoma, face    Dr Sarajane Jews   Ureteral stone with  hydronephrosis 03/24/2013   left    SURGICAL HISTORY: Past Surgical History:  Procedure Laterality Date   CATARACT EXTRACTION  2005   OD    COLONOSCOPY W/ POLYPECTOMY  2007   adenoma X2 ; [facility]   CYSTOSCOPY W/ URETERAL STENT PLACEMENT Left 03/23/2013   Procedure: CYSTOSCOPY WITH RETROGRADE PYELOGRAM/URETERAL STENT PLACEMENT;  Surgeon: Fredricka Bonine, MD;  Location: [facility];  Service: Urology;  Laterality: Left;   CYSTOSCOPY WITH RETROGRADE PYELOGRAM, URETEROSCOPY AND STENT PLACEMENT Bilateral 07/15/2018   Procedure: CYSTOSCOPY WITH RETROGRADE PYELOGRAM, URETEROSCOPY;  Surgeon: Irine Seal, MD;  Location: [facility];  Service: Urology;  Laterality: Bilateral;   CYSTOSCOPY WITH STENT PLACEMENT Left 07/15/2018   Procedure: CYSTOSCOPY WITH STENT PLACEMENT;  Surgeon: Irine Seal, MD;  Location: [facility];  Service: Urology;  Laterality: Left;   CYSTOSCOPY WITH URETEROSCOPY  02/28/2012   Procedure: CYSTOSCOPY WITH URETEROSCOPY;  Surgeon: Malka So, MD;  Location: [facility];  Service: Urology;  Laterality: Left;   CYSTOSCOPY/URETEROSCOPY/HOLMIUM LASER/STENT PLACEMENT Right 07/31/2018   Procedure: CYSTOSCOPY STENT REMOVAL  RIGHT URETEROSCOPY WITH HOLMIUM LASER POSSIBLE STENT PLACEMENT;  Surgeon: Irine Seal, MD;  Location: [facility];  Service: Urology;  Laterality: Right;   HOLMIUM LASER APPLICATION Left 08/08/5091   Procedure: HOLMIUM LASER APPLICATION;  Surgeon: Malka So, MD;  Location: [facility];  Service: Urology;  Laterality: Left;   HOLMIUM LASER APPLICATION Right 26/71/2458   Procedure: HOLMIUM LASER APPLICATION;  Surgeon: Irine Seal, MD;  Location: [facility];  Service: Urology;  Laterality: Right;   INGUINAL LYMPH NODE BIOPSY Left 09/16/2018   Procedure: LEFT INGUINAL LYMPH NODE BIOPSY;  Surgeon: Erroll Luna, MD;  Location: [place];  Service: General;  Laterality: Left;   LITHOTRIPSY      X2 w/o benefit   NEPHROLITHOTOMY  02/28/2012    Procedure: NEPHROLITHOTOMY PERCUTANEOUS;  Surgeon: Malka So, MD;  Location: [facility];  Service: Urology;  Laterality: Left;   TONSILLECTOMY     URETEROSCOPY Left 03/31/2013   Procedure: URETEROSCOPY;  Surgeon: Malka So, MD;  Location: [facility];  Service: Urology;  Laterality: Left;   VASECTOMY      SOCIAL HISTORY: Social History   Socioeconomic History   Marital status: Married    Spouse name: Not on file   Number of children: 2   Years of education: 13   Highest education level: Not on file  Occupational History   Occupation: Facilities manager (retired)    Fish farm manager: [employer], fashion/clothing strain: Not on file   Food insecurity:    Worry: Not on file    Inability: Not on file   Transportation needs:    Medical: Not on file    Non-medical: Not on file  Tobacco Use   Smoking status: Never Smoker   Smokeless tobacco: Never Used  Substance and Sexual Activity   Alcohol use: Yes    Comment:  rarely   Drug use: No   Sexual activity: Not on file  Lifestyle   Physical activity:    Days per week: Not on file    Minutes per session: Not on file   Stress: Not on file  Relationships   Social connections:    Talks on phone: Not on file    Gets together: Not on file    Attends religious service: Not on file    Active member of club or organization: Not on file    Attends meetings of clubs or organizations: Not on file    Relationship status: Not on file   Intimate partner violence:    Fear of current or ex partner: Not on file    Emotionally abused: Not on file    Physically abused: Not on file    Forced sexual activity: Not on file  Other Topics Concern   Not on file  Social History Narrative   Fun: Work around the house and work out.     FAMILY HISTORY: Family History  Problem Relation Age of Onset   Heart attack Father 23        CBAG X5 vessels   Hyperlipidemia Father    Hypertension Father    Heart disease  Father        before age 40   Hypertension Brother    Aortic aneurysm Paternal Uncle        AAA ruptured   Heart disease Mother    Hyperlipidemia Mother    Hypertension Mother    Diabetes Neg Hx     ALLERGIES:  is allergic to angiotensin receptor blockers; ciprofloxacin; and pyridium [phenazopyridine].  MEDICATIONS:  Current Outpatient Medications  Medication Sig Dispense Refill   amLODipine (NORVASC) 5 MG tablet TAKE 1 TABLET BY MOUTH EVERY DAY (Patient taking differently: Take 5 mg by mouth daily. ) 90 tablet 3   atorvastatin (LIPITOR) 20 MG tablet Take 1 tablet (20 mg total) by mouth See admin instructions. Take 20 mg by mouth on Monday, Wednesday, Friday and Sunday 51 tablet 3   HYDROcodone-acetaminophen (NORCO/VICODIN) 5-325 MG tablet Take 1 tablet by mouth every 6 (six) hours as needed for moderate pain. 12 tablet 0   metoprolol tartrate (LOPRESSOR) 25 MG tablet TAKE 1 TABLET BY MOUTH TWICE A DAY (Patient taking differently: Take 25 mg by mouth 2 (two) times daily. ) 180 tablet 3   No current facility-administered medications for this visit.     REVIEW OF SYSTEMS:    A 10+ POINT REVIEW OF SYSTEMS WAS OBTAINED including neurology, dermatology, psychiatry, cardiac, respiratory, lymph, extremities, GI, GU, Musculoskeletal, constitutional, breasts, reproductive, HEENT.  All pertinent positives are noted in the HPI.  All others are negative.   PHYSICAL EXAMINATION:  . Vitals:   10/13/18 1452  BP: (!) 143/94  Pulse: 60  Resp: 17  Temp: 98.4 F (36.9 C)  SpO2: 100%   Filed Weights   10/13/18 1452  Weight: 199 lb 12.8 oz (90.6 kg)   .Body mass index is 26.36 kg/m.  GENERAL:alert, in no acute distress and comfortable SKIN: no acute rashes, no significant lesions EYES: conjunctiva are pink and non-injected, sclera anicteric OROPHARYNX: MMM, no exudates, no oropharyngeal erythema or ulceration NECK: supple, no JVD LYMPH:  no palpable lymphadenopathy in the  cervical, axillary or inguinal regions LUNGS: clear to auscultation b/l with normal respiratory effort HEART: regular rate & rhythm ABDOMEN:  normoactive bowel sounds , non tender, not distended. No palpable hepatosplenomegaly.  GU: Small seroma to left inguinal area where surgery was completed Extremity: no pedal edema PSYCH:  alert & oriented x 3 with fluent speech NEURO: no focal motor/sensory deficits  LABORATORY DATA:  I have reviewed the data as listed  . CBC Latest Ref Rng & Units 09/29/2018 09/11/2018 08/20/2018  WBC 4.0 - 10.5 K/uL 7.5 6.6 7.3  Hemoglobin 13.0 - 17.0 g/dL 13.7 14.0 13.7  Hematocrit 39.0 - 52.0 % 44.3 43.8 45.0  Platelets 150 - 400 K/uL 206 182 186    . CMP Latest Ref Rng & Units 09/29/2018 09/11/2018 07/31/2018  Glucose 70 - 99 mg/dL 95 95 96  BUN 8 - 23 mg/dL 27(H) 25(H) 32(H)  Creatinine 0.61 - 1.24 mg/dL 1.58(H) 1.64(H) 1.72(H)  Sodium 135 - 145 mmol/L 142 139 142  Potassium 3.5 - 5.1 mmol/L 4.3 4.9 4.1  Chloride 98 - 111 mmol/L 105 103 -  CO2 22 - 32 mmol/L 27 29 -  Calcium 8.9 - 10.3 mg/dL 9.0 9.1 -  Total Protein 6.5 - 8.1 g/dL 6.6 6.1(L) -  Total Bilirubin 0.3 - 1.2 mg/dL 0.6 0.9 -  Alkaline Phos 38 - 126 U/L 62 47 -  AST 15 - 41 U/L 14(L) 19 -  ALT 0 - 44 U/L 11 17 -      RADIOGRAPHIC STUDIES: I have personally reviewed the radiological images as listed and agreed with the findings in the report. Nm Pet Image Initial (pi) Skull Base To Thigh  Result Date: 10/08/2018 CLINICAL DATA:  Initial treatment strategy for staging of follicular lymphoma of inguinal region. EXAM: NUCLEAR MEDICINE PET SKULL BASE TO THIGH TECHNIQUE: 10.3 mCi F-18 FDG was injected intravenously. Full-ring PET imaging was performed from the skull base to thigh after the radiotracer. CT data was obtained and used for attenuation correction and anatomic localization. Fasting blood glucose: 89 mg/dl COMPARISON:  CTs of the abdomen and pelvis including 07/14/2018. FINDINGS: Mediastinal  blood pool activity: SUV max 2.5 Liver activity:  S.U.V. max 3.4 NECK: No areas of abnormal hypermetabolism. Incidental CT findings: No cervical adenopathy. CHEST: No pulmonary parenchymal or thoracic nodal hypermetabolism. Incidental CT findings: Tortuous thoracic aorta. Aortic atherosclerosis. Mild cardiomegaly. Lad coronary artery calcification. Mediastinal nodes are small and calcified likely related to old granulomatous disease. Right middle lobe calcified granuloma. ABDOMEN/PELVIS: No abdominal nodal or parenchymal hypermetabolism. Left external iliac node measures 1.9 cm and a S.U.V. max of 5.4 on image 185/4. Multiple left inguinal nodes have enlarged from 07/14/2018. An index node measures 1.4 cm and a S.U.V. max of 6.5 on image 190/4. This node measures 1.1 cm on the prior diagnostic CT. Heterogeneous hypermetabolism at the site of the previous dominant left inguinal node. This could be postoperative or represent residual nodal tissue in this area. Incidental CT findings: Normal adrenal glands. Bilateral renal collecting system calculi, maximally 7 mm on the left. Mild prostatomegaly. Suggestion of bladder wall thickening which may represent a component of outlet obstruction. Celiac ectasia at 1.9 cm is not significantly changed. No abdominal adenopathy. Left common iliac artery aneurysm of up to 3.2 cm is not significantly changed. SKELETON: No abnormal marrow activity. Incidental CT findings: none IMPRESSION: 1. Left inguinal and external iliac hypermetabolic adenopathy, consistent with active lymphoma. (Deauville 4). 2. No extrapelvic disease identified. 3. Coronary artery atherosclerosis. Aortic Atherosclerosis (ICD10-I70.0). 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 Electronically Signed   By: Abigail Miyamoto M.D.   On: 10/08/2018 20:04    ASSESSMENT & PLAN:   1. Low-grade follicular lymphoma- Stage I -First noticed a left inner thigh  lump approximately 3-4  years ago and noted that it increased in size over the past couple of months.  -He had an US of the LLE on 07/16/2018 showed: Soft tissue masses in the left inguinal region most compatible with enlarged lymph nodes/adenopathy, the largest measuring up to 6.1 cm. -On 08/20/2018, the patient underwent cytometry that showed: Tissue-Flow Cytometry with monoclonal B-cell population identified.  -Lymph node biopsy on 08/20/2018 showed: Lymph node, needle/core biopsy, Left Inguinal with atypical lymphoid proliferation.  -On 09/16/2018, the patient had a biopsy completed with pathology showing: Lymph node for lymphoma, left inguinal with low grade follicular lymphoma.    PLAN:  -Discussed pt labwork from 09/29/18; blood counts normal. Chemistries are stable. LDH normal at 133. -09/29/18 Hep B and Hep C negative -Discussed the 10/08/18 PET/CT which revealed Left inguinal and external iliac hypermetabolic adenopathy, consistent with active lymphoma. (Deauville 4). 2. No extrapelvic disease identified. 3. Coronary artery atherosclerosis. Aortic Atherosclerosis. 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 -Discussed the staging as Stage I to II. -Discussed that there is an option to complete RT to the patient's left inguinal and external iliac adenopathy, which could possibly be curative, and would need a negative bone marrow biopsy first in the absence of local symptoms to r/o BM involvement which would suggest stage IV disease. - Other option is watchful observation and likely later IV Chemo-immunotherapy if symptomatic. -The pt prefers a wait and watch approach at this time but will consider if he would like a referral to [place] for further consideration in the interim -Will see the pt back in 3 months   RTC with Dr Irene Limbo with labs in 3 months   All of the patients questions were answered with apparent satisfaction. The patient knows to call the clinic with any problems,  questions or concerns.  The total time spent in the appt was 25 minutes and more than 50% was on counseling and direct patient cares.    Sullivan Lone MD MS AAHIVMS [facility] Hematology/Oncology Physician [facility]  (Office):       [phone] (Work cell):  [phone] (Fax):           [phone]  10/13/2018 3:21 PM  I, Baldwin Jamaica, am acting as a scribe for Dr. Sullivan Lone.   .I have reviewed the above documentation for accuracy and completeness, and I agree with the above. Brunetta Genera MD

## 2018-10-13 ENCOUNTER — Inpatient Hospital Stay: Payer: Medicare Other | Admitting: Hematology

## 2018-10-13 ENCOUNTER — Telehealth: Payer: Self-pay | Admitting: Hematology

## 2018-10-13 ENCOUNTER — Other Ambulatory Visit: Payer: Self-pay

## 2018-10-13 VITALS — BP 143/94 | HR 60 | Temp 98.4°F | Resp 17 | Ht 73.0 in | Wt 199.8 lb

## 2018-10-13 DIAGNOSIS — C8295 Follicular lymphoma, unspecified, lymph nodes of inguinal region and lower limb: Secondary | ICD-10-CM

## 2018-10-13 DIAGNOSIS — E785 Hyperlipidemia, unspecified: Secondary | ICD-10-CM

## 2018-10-13 DIAGNOSIS — I10 Essential (primary) hypertension: Secondary | ICD-10-CM

## 2018-10-13 DIAGNOSIS — Z79899 Other long term (current) drug therapy: Secondary | ICD-10-CM

## 2018-10-13 NOTE — Telephone Encounter (Signed)
Scheduled appt per 3/16 los. ° °Printed calendar and avs. °

## 2018-11-25 ENCOUNTER — Telehealth: Payer: Self-pay | Admitting: *Deleted

## 2018-11-25 NOTE — Telephone Encounter (Signed)
Patient called. Wants to tell Dr. Irene Limbo that he thinks his lymph nodes are growing quicker than before. His appt is in June and wants to know if he should come in sooner. Also wants to know if he should start radiation sooner. Dr. Irene Limbo will be given patient questions.

## 2018-11-26 ENCOUNTER — Telehealth: Payer: Self-pay | Admitting: *Deleted

## 2018-11-26 NOTE — Telephone Encounter (Signed)
Per Dr. Irene Limbo - schedule patient for appt in next 2 weeks for evaluation. Contacted patient and patient in agreement. Msg sent to scheduling to contact patient for appt

## 2018-11-27 ENCOUNTER — Telehealth: Payer: Self-pay | Admitting: Hematology

## 2018-11-27 NOTE — Telephone Encounter (Signed)
Scheduled appt per sch msg. Called patient. No answer. Left msg.

## 2018-12-05 NOTE — Progress Notes (Signed)
HEMATOLOGY/ONCOLOGY CONSULTATION NOTE  Date of Service: 12/08/2018  Patient Care Team: Richard Barrack, [name] as PCP - General (Family Medicine)  CHIEF COMPLAINTS/PURPOSE OF CONSULTATION:  F/u for follicular lymphoma  HISTORY OF PRESENTING ILLNESS:   Richard [name] is a wonderful 73 y.o. male who has been referred to [place] by Richard. Brantley [name] for evaluation and management of low grade follicular lymphoma. He is being accompanied by his wife, Richard [name] today. He notes that he first noticed a lump to his left inner thigh during a shower approximately 3-4 years ago and noted that it increased in size over the past couple of months.   He had an US of the LLE on 07/16/2018 showed: Soft tissue masses in the left inguinal region most compatible with enlarged lymph nodes/adenopathy, the largest measuring up to 6.1 cm.  On 08/20/2018, the patient underwent cytometry that showed: Tissue-Flow Cytometry with monoclonal B-cell population identified.   Lymph node biopsy on 08/20/2018 showed: Lymph node, needle/core biopsy, Left Inguinal with atypical lymphoid proliferation.   On 09/16/2018, the patient had biopsy completed with pathology showing: Lymph node for lymphoma, left inguinal with low grade follicular lymphoma.   PMHx, he has a hx of squamous cell carcinoma to bilateral sides of his face, none that is active.   SHx, he has had several cystoscopies within the past 4 months and his urologist is   On review of systems, he reports additional left inguinal lump x 4-5 months, intentional weight loss, recurrent kidney stones (most recently December 2019). he denies fever, chills, night sweats, no other areas of concerns, leg swelling, abdominal pain, flank pain, testicular pain/swelling, rashes, and any other symptoms. He hasn't had his prior stones analyzed to determine the cause of them. He has intentionally lost 45 lbs in the past couple of weeks through keto diet and intermittent fasting. He doesn't consume  bread or pasta. He works out with [employer] about every other day. His PCP is Richard Barrack, [name] and his Urologist is Richard [name]. Pertinent positives are listed and detailed within the above HPI.   Interval History:   Richard [name] returns today for management and evaluation of his Follicular Lymphoma. The patient's last visit with [place] was on 10/13/18. The pt reports that he is doing well overall.  The pt reports that he feels that his inguinal mass has grown. He denies this being painful or uncomfortable. He denies developing any fevers, chills, night sweats or unexpected weight loss. The pt notes that he has intentionally lost 50 pounds since last June 2019, through intermittent fasting and the keto diet. He denies leg swelling.   He began Flomax in the interim.  Lab results today (12/08/18) of CBC w/diff and CMP is as follows: all values are WNL except for Potassium at 5.3, Glucose at 103, BUN at 35, Creatinine at 1.55, GFR at 44. 12/08/18 LDH at 125  On review of systems, pt reports grown inguinal mass, good energy levels, intentional weight loss, urinating well, and denies fevers, chills, night sweats, unexpected weight loss, leg swelling, and any other symptoms.   MEDICAL HISTORY:  Past Medical History:  Diagnosis Date   Aneurysm ([place])    left common iliac-stable   Arthritis    Fasting hyperglycemia    History of kidney stones    Hyperlipidemia    LDL goal = < 100 based on NMR Lipoproprofile   Hypertension    Mass of left lower extremity    inner thigh  Nephrolithiasis    left    Renal insufficiency    Sepsis ([place]) 03/24/2013   Squamous cell carcinoma, face    Richard [name]   Ureteral stone with hydronephrosis 03/24/2013   left    SURGICAL HISTORY: Past Surgical History:  Procedure Laterality Date   CATARACT EXTRACTION  2005   OD    COLONOSCOPY W/ POLYPECTOMY  2007   adenoma X2 ; [facility]   CYSTOSCOPY W/ URETERAL STENT PLACEMENT Left 03/23/2013    Procedure: CYSTOSCOPY WITH RETROGRADE PYELOGRAM/URETERAL STENT PLACEMENT;  Surgeon: Richard Bonine, [name];  Location: [facility];  Service: Urology;  Laterality: Left;   CYSTOSCOPY WITH RETROGRADE PYELOGRAM, URETEROSCOPY AND STENT PLACEMENT Bilateral 07/15/2018   Procedure: CYSTOSCOPY WITH RETROGRADE PYELOGRAM, URETEROSCOPY;  Surgeon: Richard Seal, [name];  Location: [facility];  Service: Urology;  Laterality: Bilateral;   CYSTOSCOPY WITH STENT PLACEMENT Left 07/15/2018   Procedure: CYSTOSCOPY WITH STENT PLACEMENT;  Surgeon: Richard Seal, [name];  Location: [facility];  Service: Urology;  Laterality: Left;   CYSTOSCOPY WITH URETEROSCOPY  02/28/2012   Procedure: CYSTOSCOPY WITH URETEROSCOPY;  Surgeon: Richard So, [name];  Location: [facility];  Service: Urology;  Laterality: Left;   CYSTOSCOPY/URETEROSCOPY/HOLMIUM LASER/STENT PLACEMENT Right 07/31/2018   Procedure: CYSTOSCOPY STENT REMOVAL  RIGHT URETEROSCOPY WITH HOLMIUM LASER POSSIBLE STENT PLACEMENT;  Surgeon: Richard Seal, [name];  Location: [facility];  Service: Urology;  Laterality: Right;   HOLMIUM LASER APPLICATION Left 08/05/6158   Procedure: HOLMIUM LASER APPLICATION;  Surgeon: Richard So, [name];  Location: [facility];  Service: Urology;  Laterality: Left;   HOLMIUM LASER APPLICATION Right 73/71/0626   Procedure: HOLMIUM LASER APPLICATION;  Surgeon: Richard Seal, [name];  Location: [facility];  Service: Urology;  Laterality: Right;   INGUINAL LYMPH NODE BIOPSY Left 09/16/2018   Procedure: LEFT INGUINAL LYMPH NODE BIOPSY;  Surgeon: Richard Luna, [name];  Location: [facility];  Service: General;  Laterality: Left;   LITHOTRIPSY      X2 w/o benefit   NEPHROLITHOTOMY  02/28/2012   Procedure: NEPHROLITHOTOMY PERCUTANEOUS;  Surgeon: Richard So, [name];  Location: [facility];  Service: Urology;  Laterality: Left;   TONSILLECTOMY     URETEROSCOPY Left 03/31/2013   Procedure: URETEROSCOPY;  Surgeon: Richard So, [name];  Location: [facility];  Service: Urology;   Laterality: Left;   VASECTOMY      SOCIAL HISTORY: Social History   Socioeconomic History   Marital status: Married    Spouse name: Not on file   Number of children: 2   Years of education: 13   Highest education level: Not on file  Occupational History   Occupation: Facilities manager (retired)    Fish farm manager: [employer], fashion/clothing strain: Not on file   Food insecurity:    Worry: Not on file    Inability: Not on file   Transportation needs:    Medical: Not on file    Non-medical: Not on file  Tobacco Use   Smoking status: Never Smoker   Smokeless tobacco: Never Used  Substance and Sexual Activity   Alcohol use: Yes    Comment:  rarely   Drug use: No   Sexual activity: Not on file  Lifestyle   Physical activity:    Days per week: Not on file    Minutes per session: Not on file   Stress: Not on file  Relationships   Social connections:    Talks on phone: Not on file  Gets together: Not on file    Attends religious service: Not on file    Active member of club or organization: Not on file    Attends meetings of clubs or organizations: Not on file    Relationship status: Not on file   Intimate partner violence:    Fear of current or ex partner: Not on file    Emotionally abused: Not on file    Physically abused: Not on file    Forced sexual activity: Not on file  Other Topics Concern   Not on file  Social History Narrative   Fun: Work around the house and work out.     FAMILY HISTORY: Family History  Problem Relation Age of Onset   Heart attack Father 56        CBAG X5 vessels   Hyperlipidemia Father    Hypertension Father    Heart disease Father        before age 12   Hypertension Brother    Aortic aneurysm Paternal Uncle        AAA ruptured   Heart disease Mother    Hyperlipidemia Mother    Hypertension Mother    Diabetes Neg Hx     ALLERGIES:  is allergic to angiotensin receptor blockers;  ciprofloxacin; and pyridium [phenazopyridine].  MEDICATIONS:  Current Outpatient Medications  Medication Sig Dispense Refill   amLODipine (NORVASC) 5 MG tablet TAKE 1 TABLET BY MOUTH EVERY DAY (Patient taking differently: Take 5 mg by mouth daily. ) 90 tablet 3   atorvastatin (LIPITOR) 20 MG tablet Take 1 tablet (20 mg total) by mouth See admin instructions. Take 20 mg by mouth on Monday, Wednesday, Friday and Sunday 51 tablet 3   HYDROcodone-acetaminophen (NORCO/VICODIN) 5-325 MG tablet Take 1 tablet by mouth every 6 (six) hours as needed for moderate pain. 12 tablet 0   metoprolol tartrate (LOPRESSOR) 25 MG tablet TAKE 1 TABLET BY MOUTH TWICE A DAY (Patient taking differently: Take 25 mg by mouth 2 (two) times daily. ) 180 tablet 3   No current facility-administered medications for this visit.     REVIEW OF SYSTEMS:    A 10+ POINT REVIEW OF SYSTEMS WAS OBTAINED including neurology, dermatology, psychiatry, cardiac, respiratory, lymph, extremities, GI, GU, Musculoskeletal, constitutional, breasts, reproductive, HEENT.  All pertinent positives are noted in the HPI.  All others are negative.   PHYSICAL EXAMINATION:  Vitals:   12/08/18 1511  BP: 136/77  Pulse: 62  Resp: 17  Temp: 98.2 F (36.8 C)  SpO2: 100%   Filed Weights   12/08/18 1511  Weight: 197 lb 12.8 oz (89.7 kg)   .Body mass index is 26.1 kg/m.  GENERAL:alert, in no acute distress and comfortable SKIN: no acute rashes, no significant lesions EYES: conjunctiva are pink and non-injected, sclera anicteric OROPHARYNX: MMM, no exudates, no oropharyngeal erythema or ulceration NECK: supple, no JVD LYMPH: 4-6cm left inguinal mass, other multiple lymph nodes 2-3 cm. No palpable lymphadenopathy in the cervical or axillary regions LUNGS: clear to auscultation b/l with normal respiratory effort HEART: regular rate & rhythm ABDOMEN:  normoactive bowel sounds , non tender, not distended. No palpable hepatosplenomegaly.    Extremity: no pedal edema PSYCH: alert & oriented x 3 with fluent speech NEURO: no focal motor/sensory deficits   LABORATORY DATA:  I have reviewed the data as listed  . CBC Latest Ref Rng & Units 12/08/2018 09/29/2018 09/11/2018  WBC 4.0 - 10.5 K/uL 6.4 7.5 6.6  Hemoglobin 13.0 - 17.0 g/dL  14.2 13.7 14.0  Hematocrit 39.0 - 52.0 % 45.0 44.3 43.8  Platelets 150 - 400 K/uL 183 206 182    . CMP Latest Ref Rng & Units 12/08/2018 09/29/2018 09/11/2018  Glucose 70 - 99 mg/dL 103(H) 95 95  BUN 8 - 23 mg/dL 35(H) 27(H) 25(H)  Creatinine 0.61 - 1.24 mg/dL 1.55(H) 1.58(H) 1.64(H)  Sodium 135 - 145 mmol/L 141 142 139  Potassium 3.5 - 5.1 mmol/L 5.3(H) 4.3 4.9  Chloride 98 - 111 mmol/L 105 105 103  CO2 22 - 32 mmol/L _0 Calcium 8.9 - 10.3 mg/dL 9.5 9.0 9.1  Total Protein 6.5 - 8.1 g/dL 6.6 6.6 6.1(L)  Total Bilirubin 0.3 - 1.2 mg/dL 0.5 0.6 0.9  Alkaline Phos 38 - 126 U/L 66 62 47  AST 15 - 41 U/L 17 14(L) 19  ALT 0 - 44 U/L _1 RADIOGRAPHIC STUDIES: I have personally reviewed the radiological images as listed and agreed with the findings in the report. No results found.  ASSESSMENT & PLAN:   1. Low-grade follicular lymphoma- [name] I to II -First noticed a left inner thigh lump approximately 3-4 years ago and noted that it increased in size over the past couple of months.  -He had an US of the LLE on 07/16/2018 showed: Soft tissue masses in the left inguinal region most compatible with enlarged lymph nodes/adenopathy, the largest measuring up to 6.1 cm. 08/20/2018 Tissue-Flow Cytometry with monoclonal B-cell population identified.  08/20/2018 LN Biopsy revealed Left Inguinal with atypical lymphoid proliferation.  09/16/2018: Lymph node biopsy, left inguinal with low grade follicular lymphoma.   09/29/18 Hep B and Hep C negative 10/08/18 PET/CT revealed Left inguinal and external iliac hypermetabolic adenopathy, consistent with active lymphoma. ([place] [facility]). 2. No extrapelvic  disease identified. 3. Coronary artery atherosclerosis. Aortic Atherosclerosis. 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  PLAN:  -Discussed pt labwork today, 12/08/18; blood counts normal, chemistries are stable. LDH normal at 125. Potassium at 5.3 -Advised decreasing potassium consumption -Discussed again that there is an option to complete RT to the patient's left inguinal and external iliac adenopathy, which could possibly be curative, and would need a negative bone marrow biopsy first in the absence of local symptoms to r/o BM involvement which would suggest [name] IV disease. -Pt prefers not to obtain a BM Biopsy, and would like to move forward with referral to Rad Onc for RT for local control -Other option is watchful observation and likely later IV Chemo-immunotherapy if symptomatic. -The pt prefers a wait and watch approach at this time but will consider if he would like a referral to [place] for further consideration in the interim -Will see the pt back in 2 months   Referral to radiation oncology for consideration of ISRT for stageI-II low grade follicular lymphoma of left inguinal/external iliac region RTC with Richard Irene Limbo in 2 months with labs   All of the patients questions were answered with apparent satisfaction. The patient knows to call the clinic with any problems, questions or concerns.  The total time spent in the appt was 25 minutes and more than 50% was on counseling and direct patient cares.    Sullivan Lone [name] AAHIVMS [facility] Hematology/Oncology Physician [facility]  (Office):       [phone] (Work cell):  [phone] (Fax):           [phone]  12/08/2018  4:04 PM  I, Schuyler Bain, am acting as a scribe for Richard. Sullivan Lone.   .I have reviewed the above documentation for accuracy and completeness, and I agree with the above. Brunetta Genera [name]

## 2018-12-08 ENCOUNTER — Other Ambulatory Visit: Payer: Self-pay

## 2018-12-08 ENCOUNTER — Inpatient Hospital Stay: Payer: Medicare Other | Attending: Hematology | Admitting: Hematology

## 2018-12-08 ENCOUNTER — Inpatient Hospital Stay: Payer: Medicare Other

## 2018-12-08 VITALS — BP 136/77 | HR 62 | Temp 98.2°F | Resp 17 | Ht 73.0 in | Wt 197.8 lb

## 2018-12-08 DIAGNOSIS — E785 Hyperlipidemia, unspecified: Secondary | ICD-10-CM | POA: Diagnosis not present

## 2018-12-08 DIAGNOSIS — I10 Essential (primary) hypertension: Secondary | ICD-10-CM | POA: Diagnosis not present

## 2018-12-08 DIAGNOSIS — Z79899 Other long term (current) drug therapy: Secondary | ICD-10-CM | POA: Insufficient documentation

## 2018-12-08 DIAGNOSIS — C8295 Follicular lymphoma, unspecified, lymph nodes of inguinal region and lower limb: Secondary | ICD-10-CM | POA: Diagnosis present

## 2018-12-08 LAB — CMP (CANCER CENTER ONLY)
ALT: 16 U/L (ref 0–44)
AST: 17 U/L (ref 15–41)
Albumin: 4.1 g/dL (ref 3.5–5.0)
Alkaline Phosphatase: 66 U/L (ref 38–126)
Anion gap: 7 (ref 5–15)
BUN: 35 mg/dL — ABNORMAL HIGH (ref 8–23)
CO2: 29 mmol/L (ref 22–32)
Calcium: 9.5 mg/dL (ref 8.9–10.3)
Chloride: 105 mmol/L (ref 98–111)
Creatinine: 1.55 mg/dL — ABNORMAL HIGH (ref 0.61–1.24)
GFR, Est AFR Am: 51 mL/min — ABNORMAL LOW (ref >60)
GFR, Estimated: 44 mL/min — ABNORMAL LOW (ref >60)
Glucose, Bld: 103 mg/dL — ABNORMAL HIGH (ref 70–99)
Potassium: 5.3 mmol/L — ABNORMAL HIGH (ref 3.5–5.1)
Sodium: 141 mmol/L (ref 135–145)
Total Bilirubin: 0.5 mg/dL (ref 0.3–1.2)
Total Protein: 6.6 g/dL (ref 6.5–8.1)

## 2018-12-08 LAB — CBC WITH DIFFERENTIAL/PLATELET
Abs Immature Granulocytes: 0.01 10*3/uL (ref 0.00–0.07)
Basophils Absolute: 0 10*3/uL (ref 0.0–0.1)
Basophils Relative: 1 %
Eosinophils Absolute: 0.1 10*3/uL (ref 0.0–0.5)
Eosinophils Relative: 2 %
HCT: 45 % (ref 39.0–52.0)
Hemoglobin: 14.2 g/dL (ref 13.0–17.0)
Immature Granulocytes: 0 %
Lymphocytes Relative: 26 %
Lymphs Abs: 1.7 10*3/uL (ref 0.7–4.0)
MCH: 27.4 pg (ref 26.0–34.0)
MCHC: 31.6 g/dL (ref 30.0–36.0)
MCV: 86.9 fL (ref 80.0–100.0)
Monocytes Absolute: 0.6 10*3/uL (ref 0.1–1.0)
Monocytes Relative: 9 %
Neutro Abs: 4 10*3/uL (ref 1.7–7.7)
Neutrophils Relative %: 62 %
Platelets: 183 10*3/uL (ref 150–400)
RBC: 5.18 MIL/uL (ref 4.22–5.81)
RDW: 14.1 % (ref 11.5–15.5)
WBC: 6.4 10*3/uL (ref 4.0–10.5)
nRBC: 0 % (ref 0.0–0.2)

## 2018-12-08 LAB — LACTATE DEHYDROGENASE: LDH: 125 U/L (ref 98–192)

## 2018-12-09 ENCOUNTER — Telehealth: Payer: Self-pay | Admitting: Hematology

## 2018-12-09 NOTE — Telephone Encounter (Signed)
Scheduled appt per 5/11 los. °

## 2018-12-09 NOTE — Progress Notes (Signed)
Lymphoma Location(s) / Histology:  08/20/18 Diagnosis Lymph node, needle/core biopsy, Left Inguinal - ATYPICAL LYMPHOID PROLIFERATION.  09/16/18 Diagnosis Lymph node for lymphoma, left inguinal - LOW GRADE FOLLICULAR LYMPHOMA  Richard [name] presented months ago with symptoms of: He first noted a lump to his left inner thigh about 3-4 years ago, but noted the area enlarging at the end of last year.   Biopsies of left inguinal lymph node revealed: low grade follicular lymphoma.   Past/Anticipated interventions by medical oncology, if any:  12/08/18 Dr. Kale PLAN:  -Discussed pt labwork today, 12/08/18; blood counts normal, chemistries are stable. LDH normal at 125. Potassium at 5.3 -Advised decreasing potassium consumption -Discussed again that there is an option to complete RT to the patient's left inguinal and external iliac adenopathy, which could possibly be curative, and would need a negative bone marrow biopsy first in the absence of local symptoms to r/o BM involvement which would suggest stage IV disease. -Pt prefers not to obtain a BM Biopsy, and would like to move forward with referral to Rad Onc for RT for local control -Other option is watchful observation and likely later IV Chemo-immunotherapy if symptomatic. -The pt prefers a wait and watch approach at this time but will consider if he would like a referral to Rad Onc for further consideration in the interim -Will see the pt back in 2 months  Referral to radiation oncology for consideration of ISRT for stageI-II low grade follicular lymphoma of left inguinal/external iliac region RTC with Dr Kale in 2 months with labs  Weight changes, if any, over the past 6 months: He reports an intentional weight loss of 50 lbs  Recurrent fevers, or drenching night sweats, if any: He denies.   SAFETY ISSUES:  Prior radiation? No  Pacemaker/ICD? No  Possible current pregnancy? No  Is the patient on methotrexate? No  Current  Complaints / other details:      

## 2018-12-16 ENCOUNTER — Encounter: Payer: Self-pay | Admitting: Radiation Oncology

## 2018-12-16 ENCOUNTER — Ambulatory Visit
Admission: RE | Admit: 2018-12-16 | Discharge: 2018-12-16 | Disposition: A | Discharge status: Home / Self Care | Payer: Medicare Other | Source: Ambulatory Visit | Attending: Radiation Oncology | Admitting: Radiation Oncology

## 2018-12-16 ENCOUNTER — Other Ambulatory Visit: Payer: Self-pay

## 2018-12-16 DIAGNOSIS — C8206 Follicular lymphoma grade I, intrapelvic lymph nodes: Secondary | ICD-10-CM | POA: Insufficient documentation

## 2018-12-16 DIAGNOSIS — C8280 Other types of follicular lymphoma, unspecified site: Secondary | ICD-10-CM

## 2018-12-16 NOTE — Progress Notes (Signed)
Radiation Oncology         [phone] ________________________________  Initial Outpatient Consultation  Name: Richard [name] MRN: [id number]  Date: 12/16/2018  DOB: 09-29-1945  KG:YJEHUD, Algis Greenhouse, MD  Brunetta Genera, MD   REFERRING PHYSICIAN: Brunetta Genera, MD  DIAGNOSIS:    ICD-10-CM   1. Follicular lymphoma grade I of intrapelvic lymph nodes (HCC) C82.06   Stage IIA  CHIEF COMPLAINT: Here to discuss management of follicular lymphoma  HISTORY OF PRESENT ILLNESS::Richard [name] is a 73 y.o. male who presented with a lump to his left inner thigh that he first noticed 3-4 years ago and had increased in size over about two months. He was initially evaluated with ultrasound of the left lower extremity on 07/16/2018 which showed: Soft tissue masses in the left inguinal region, most compatible with enlarged lymph nodes/adenopathy, the largest measuring up to 6.1 cm.  On 08/20/2018, the patient underwent ultrasound-guided core biopsy of enlarged left inguinal lymph node. Pathology revealed: Atypical lymphoid proliferation. Tissue-Flow Cytometry identified monoclonal B-cell population.  The patient subsequently underwent left inguinal lymph node biopsy on 09/16/2018. Final pathology revealed low grade follicular lymphoma. Grade 1-2 out of 3  PET scan performed 10/08/2018 showed left inguinal and external iliac hypermetabolic adenopathy, consistent with active lymphoma (Deauville 4). No extrapelvic disease identified.  The patient reviewed these results and treatment recommendations with Dr. Irene Limbo and has been referred today for discussion of radiation treatment for local control.  He denies night sweats, fevers, or unintentional weight loss. He has intentionally lost 50lb recently.  No pain.  Urine and BM are normal   He is retired, does yard work and [employer] at home   [place]: No  PAST MEDICAL HISTORY:  has a past medical history of Aneurysm ([place]),  Arthritis, Fasting hyperglycemia, History of kidney stones, Hyperlipidemia, Hypertension, Mass of left lower extremity, Nephrolithiasis, Renal insufficiency, Sepsis ([place]) (03/24/2013), Squamous cell carcinoma, face, and Ureteral stone with hydronephrosis (03/24/2013).    PAST SURGICAL HISTORY: Past Surgical History:  Procedure Laterality Date  . CATARACT EXTRACTION  2005   OD   . COLONOSCOPY W/ POLYPECTOMY  2007   adenoma X2 ; [facility]  . CYSTOSCOPY W/ URETERAL STENT PLACEMENT Left 03/23/2013   Procedure: CYSTOSCOPY WITH RETROGRADE PYELOGRAM/URETERAL STENT PLACEMENT;  Surgeon: Fredricka Bonine, MD;  Location: [facility];  Service: Urology;  Laterality: Left;  . CYSTOSCOPY WITH RETROGRADE PYELOGRAM, URETEROSCOPY AND STENT PLACEMENT Bilateral 07/15/2018   Procedure: CYSTOSCOPY WITH RETROGRADE PYELOGRAM, URETEROSCOPY;  Surgeon: Irine Seal, MD;  Location: [facility];  Service: Urology;  Laterality: Bilateral;  . CYSTOSCOPY WITH STENT PLACEMENT Left 07/15/2018   Procedure: CYSTOSCOPY WITH STENT PLACEMENT;  Surgeon: Irine Seal, MD;  [facility];  Service: Urology;  Laterality: Left;  . CYSTOSCOPY WITH URETEROSCOPY  02/28/2012   Procedure: CYSTOSCOPY WITH URETEROSCOPY;  Surgeon: Malka So, MD;  [facility];  Service: Urology;  Laterality: Left;  . CYSTOSCOPY/URETEROSCOPY/HOLMIUM LASER/STENT PLACEMENT Right 07/31/2018   Procedure: CYSTOSCOPY STENT REMOVAL  RIGHT URETEROSCOPY WITH HOLMIUM LASER POSSIBLE STENT PLACEMENT;  Surgeon: Irine Seal, MD;  Location: [facility];  Service: Urology;  Laterality: Right;  . HOLMIUM LASER APPLICATION Left 08/02/9700   Procedure: HOLMIUM LASER APPLICATION;  Surgeon: Malka So, MD;  [facility];  Service: Urology;  Laterality: Left;  . HOLMIUM LASER APPLICATION Right 63/78/5885   Procedure: HOLMIUM LASER APPLICATION;  Surgeon: Irine Seal, MD;  [facility];  Service: Urology;  Laterality: Right;  . INGUINAL LYMPH NODE BIOPSY Left  09/16/2018   Procedure: LEFT INGUINAL LYMPH NODE BIOPSY;  Surgeon: Erroll Luna, MD;  Location: [place];  Service: General;  Laterality: Left;  . LITHOTRIPSY      X2 w/o benefit  . NEPHROLITHOTOMY  02/28/2012   Procedure: NEPHROLITHOTOMY PERCUTANEOUS;  Surgeon: Malka So, MD;  [facility];  Service: Urology;  Laterality: Left;  . TONSILLECTOMY    . URETEROSCOPY Left 03/31/2013   Procedure: URETEROSCOPY;  Surgeon: Malka So, MD;  [facility];  Service: Urology;  Laterality: Left;  Marland Kitchen VASECTOMY      FAMILY HISTORY: family history includes Aortic aneurysm in his paternal uncle; Heart attack (age of onset: 38) in his father; Heart disease in his father and mother; Hyperlipidemia in his father and mother; Hypertension in his brother, father, and mother.  SOCIAL HISTORY:  reports that he has never smoked. He has never used smokeless tobacco. He reports current alcohol use. He reports that he does not use drugs.  ALLERGIES: Angiotensin receptor blockers; Ciprofloxacin; and Pyridium [phenazopyridine]  MEDICATIONS:  Current Outpatient Medications  Medication Sig Dispense Refill  . amLODipine (NORVASC) 5 MG tablet TAKE 1 TABLET BY MOUTH EVERY DAY (Patient taking differently: Take 5 mg by mouth daily. ) 90 tablet 3  . atorvastatin (LIPITOR) 20 MG tablet Take 1 tablet (20 mg total) by mouth See admin instructions. Take 20 mg by mouth on Monday, Wednesday, Friday and Sunday 51 tablet 3  . HYDROcodone-acetaminophen (NORCO/VICODIN) 5-325 MG tablet Take 1 tablet by mouth every 6 (six) hours as needed for moderate pain. 12 tablet 0  . metoprolol tartrate (LOPRESSOR) 25 MG tablet TAKE 1 TABLET BY MOUTH TWICE A DAY (Patient taking differently: Take 25 mg by mouth 2 (two) times daily. ) 180 tablet 3  . tamsulosin (FLOMAX) 0.4 MG CAPS capsule TAKE 1 CAPSULE BY MOUTH EVERYDAY AT BEDTIME     No current facility-administered medications for this encounter.     REVIEW OF SYSTEMS:   Notable for that above.   PHYSICAL EXAM:  vitals were not taken for this visit.    NAD    LABORATORY DATA:  Lab Results  Component Value Date   WBC 6.4 12/08/2018   HGB 14.2 12/08/2018   HCT 45.0 12/08/2018   MCV 86.9 12/08/2018   PLT 183 12/08/2018   CMP     Component Value Date/Time   NA 141 12/08/2018 1446   K 5.3 (H) 12/08/2018 1446   CL 105 12/08/2018 1446   CO2 29 12/08/2018 1446   GLUCOSE 103 (H) 12/08/2018 1446   BUN 35 (H) 12/08/2018 1446   CREATININE 1.55 (H) 12/08/2018 1446   CREATININE 1.33 01/01/2014 1431   CALCIUM 9.5 12/08/2018 1446   PROT 6.6 12/08/2018 1446   ALBUMIN 4.1 12/08/2018 1446   AST 17 12/08/2018 1446   ALT 16 12/08/2018 1446   ALKPHOS 66 12/08/2018 1446   BILITOT 0.5 12/08/2018 1446   GFRNONAA 44 (L) 12/08/2018 1446   GFRAA 51 (L) 12/08/2018 1446         RADIOGRAPHY: as above, I personally reviewed his imaging     IMPRESSION/PLAN: Today, I talked to the patient about the findings and work-up thus far. We discussed the patient's diagnosis of follicular low grade lymphoma and general treatment for this, highlighting the role of radiotherapy in the management. We discussed the available radiation techniques, and focused on the details of logistics and delivery.  We discussed the risks, benefits, and side effects of radiotherapy. Side effects may include but not necessarily be limited to: skin irritation, fatigue, dysuria, nausea, loose stool. No guarantees of treatment were given. A consent form was signed and placed in the patient's medical record. The patient was encouraged to ask questions that I answered to the best of my ability.   He understands alternatives to RT are observation or systemic therapy.  Given that the mass grew back rather quickly after biopsy he wants to do something, and he prefers RT over systemic therapy   Will simulate soon, and start RT in early June.  Anticipate 3.5wks of treatment  This encounter was  provided by telemedicine platform Webex due to pandemic precautions.  The patient has given verbal consent for this type of encounter and has been advised to only accept a meeting of this type in a secure network environment. The time spent during this encounter was 19 minutes. The attendants for this meeting include Eppie Gibson  and Kano Heckmann [place].  During the encounter, Eppie Gibson was located at [facility] Radiation Oncology Department.  Richard [name] Hog[place] was located at home.     __________________________________________   Eppie Gibson, MD  This document serves as a record of services personally performed by Eppie Gibson, MD. It was created on her behalf by Rae Lips, a trained medical scribe. The creation of this record is based on the scribe's personal observations and the provider's statements to them. This document has been checked and approved by the attending provider.

## 2018-12-30 ENCOUNTER — Other Ambulatory Visit: Payer: Self-pay

## 2018-12-30 ENCOUNTER — Encounter: Payer: Self-pay | Admitting: Radiation Oncology

## 2018-12-30 ENCOUNTER — Ambulatory Visit
Admission: RE | Admit: 2018-12-30 | Discharge: 2018-12-30 | Disposition: A | Discharge status: Home / Self Care | Payer: Medicare Other | Source: Ambulatory Visit | Attending: Radiation Oncology | Admitting: Radiation Oncology

## 2018-12-30 DIAGNOSIS — Z510 Encounter for antineoplastic radiation therapy: Secondary | ICD-10-CM | POA: Insufficient documentation

## 2018-12-30 DIAGNOSIS — C8206 Follicular lymphoma grade I, intrapelvic lymph nodes: Secondary | ICD-10-CM | POA: Diagnosis not present

## 2018-12-30 NOTE — Progress Notes (Signed)
  Radiation Oncology         [phone] ________________________________  Name: Richard [name] MRN: [id number]  Date: 12/30/2018  DOB: 06-Oct-1945  SIMULATION AND TREATMENT PLANNING NOTE  Outpatient  DIAGNOSIS:     ICD-10-CM   1. Follicular lymphoma grade I of intrapelvic lymph nodes (HCC) C82.06     NARRATIVE:  The patient was brought to the [facility].  Identity was confirmed.  All relevant records and images related to the planned course of therapy were reviewed.  The patient freely provided informed written consent to proceed with treatment after reviewing the details related to the planned course of therapy. The consent form was witnessed and verified by the simulation staff.    Then, the patient was set-up in a stable reproducible  supine position for radiation therapy in a Vaclock.  Genitalia were swept to the right. CT images were obtained.  Surface markings were placed.  The CT images were loaded into the planning software.    TREATMENT PLANNING NOTE: Treatment planning then occurred.  The radiation prescription was entered and confirmed.    A total of 1 medically necessary complex treatment devices were fabricated and supervised by me, in the form of vaclock.  FIELDS WITH MLCs MAY BE ADDED IN DOSIMETRY for dose homogeneity.  I have requested : Intensity Modulated Radiotherapy (IMRT) is medically necessary for this case for the following reason:  Bowel and bladder sparing.     The patient will receive 30.6 Gy in 17 fractions to the left groin/pelvis   -----------------------------------  Eppie Gibson, MD

## 2018-12-31 DIAGNOSIS — C8206 Follicular lymphoma grade I, intrapelvic lymph nodes: Secondary | ICD-10-CM | POA: Diagnosis not present

## 2019-01-05 ENCOUNTER — Ambulatory Visit
Admission: RE | Admit: 2019-01-05 | Discharge: 2019-01-05 | Disposition: A | Discharge status: Home / Self Care | Payer: Medicare Other | Source: Ambulatory Visit | Attending: Radiation Oncology | Admitting: Radiation Oncology

## 2019-01-05 ENCOUNTER — Other Ambulatory Visit: Payer: Self-pay

## 2019-01-05 DIAGNOSIS — C8206 Follicular lymphoma grade I, intrapelvic lymph nodes: Secondary | ICD-10-CM | POA: Diagnosis not present

## 2019-01-06 ENCOUNTER — Ambulatory Visit
Admission: RE | Admit: 2019-01-06 | Discharge: 2019-01-06 | Disposition: A | Discharge status: Home / Self Care | Payer: Medicare Other | Source: Ambulatory Visit | Attending: Radiation Oncology | Admitting: Radiation Oncology

## 2019-01-06 ENCOUNTER — Other Ambulatory Visit: Payer: Self-pay

## 2019-01-06 DIAGNOSIS — C8206 Follicular lymphoma grade I, intrapelvic lymph nodes: Secondary | ICD-10-CM | POA: Diagnosis not present

## 2019-01-07 ENCOUNTER — Other Ambulatory Visit: Payer: Self-pay

## 2019-01-07 ENCOUNTER — Ambulatory Visit
Admission: RE | Admit: 2019-01-07 | Discharge: 2019-01-07 | Disposition: A | Discharge status: Home / Self Care | Payer: Medicare Other | Source: Ambulatory Visit | Attending: Radiation Oncology | Admitting: Radiation Oncology

## 2019-01-07 DIAGNOSIS — C8206 Follicular lymphoma grade I, intrapelvic lymph nodes: Secondary | ICD-10-CM | POA: Diagnosis not present

## 2019-01-08 ENCOUNTER — Other Ambulatory Visit: Payer: Self-pay

## 2019-01-08 ENCOUNTER — Ambulatory Visit
Admission: RE | Admit: 2019-01-08 | Discharge: 2019-01-08 | Disposition: A | Discharge status: Home / Self Care | Payer: Medicare Other | Source: Ambulatory Visit | Attending: Radiation Oncology | Admitting: Radiation Oncology

## 2019-01-08 DIAGNOSIS — C8206 Follicular lymphoma grade I, intrapelvic lymph nodes: Secondary | ICD-10-CM | POA: Diagnosis not present

## 2019-01-09 ENCOUNTER — Ambulatory Visit
Admission: RE | Admit: 2019-01-09 | Discharge: 2019-01-09 | Disposition: A | Discharge status: Home / Self Care | Payer: Medicare Other | Source: Ambulatory Visit | Attending: Radiation Oncology | Admitting: Radiation Oncology

## 2019-01-09 ENCOUNTER — Other Ambulatory Visit: Payer: Self-pay

## 2019-01-09 DIAGNOSIS — C8206 Follicular lymphoma grade I, intrapelvic lymph nodes: Secondary | ICD-10-CM | POA: Diagnosis not present

## 2019-01-12 ENCOUNTER — Ambulatory Visit
Admission: RE | Admit: 2019-01-12 | Discharge: 2019-01-12 | Disposition: A | Discharge status: Home / Self Care | Payer: Medicare Other | Source: Ambulatory Visit | Attending: Radiation Oncology | Admitting: Radiation Oncology

## 2019-01-12 ENCOUNTER — Other Ambulatory Visit: Payer: Self-pay

## 2019-01-12 DIAGNOSIS — C8206 Follicular lymphoma grade I, intrapelvic lymph nodes: Secondary | ICD-10-CM | POA: Diagnosis not present

## 2019-01-13 ENCOUNTER — Other Ambulatory Visit: Payer: Self-pay

## 2019-01-13 ENCOUNTER — Ambulatory Visit: Payer: Medicare Other | Admitting: Hematology

## 2019-01-13 ENCOUNTER — Other Ambulatory Visit: Payer: Medicare Other

## 2019-01-13 ENCOUNTER — Ambulatory Visit
Admission: RE | Admit: 2019-01-13 | Discharge: 2019-01-13 | Disposition: A | Discharge status: Home / Self Care | Payer: Medicare Other | Source: Ambulatory Visit | Attending: Radiation Oncology | Admitting: Radiation Oncology

## 2019-01-13 DIAGNOSIS — C8206 Follicular lymphoma grade I, intrapelvic lymph nodes: Secondary | ICD-10-CM | POA: Diagnosis not present

## 2019-01-14 ENCOUNTER — Other Ambulatory Visit: Payer: Self-pay

## 2019-01-14 ENCOUNTER — Ambulatory Visit
Admission: RE | Admit: 2019-01-14 | Discharge: 2019-01-14 | Disposition: A | Discharge status: Home / Self Care | Payer: Medicare Other | Source: Ambulatory Visit | Attending: Radiation Oncology | Admitting: Radiation Oncology

## 2019-01-14 DIAGNOSIS — C8206 Follicular lymphoma grade I, intrapelvic lymph nodes: Secondary | ICD-10-CM | POA: Diagnosis not present

## 2019-01-15 ENCOUNTER — Other Ambulatory Visit: Payer: Self-pay

## 2019-01-15 ENCOUNTER — Ambulatory Visit
Admission: RE | Admit: 2019-01-15 | Discharge: 2019-01-15 | Disposition: A | Discharge status: Home / Self Care | Payer: Medicare Other | Source: Ambulatory Visit | Attending: Radiation Oncology | Admitting: Radiation Oncology

## 2019-01-15 DIAGNOSIS — C8206 Follicular lymphoma grade I, intrapelvic lymph nodes: Secondary | ICD-10-CM | POA: Diagnosis not present

## 2019-01-16 ENCOUNTER — Ambulatory Visit
Admission: RE | Admit: 2019-01-16 | Discharge: 2019-01-16 | Disposition: A | Discharge status: Home / Self Care | Payer: Medicare Other | Source: Ambulatory Visit | Attending: Radiation Oncology | Admitting: Radiation Oncology

## 2019-01-16 ENCOUNTER — Other Ambulatory Visit: Payer: Self-pay

## 2019-01-16 DIAGNOSIS — C8206 Follicular lymphoma grade I, intrapelvic lymph nodes: Secondary | ICD-10-CM | POA: Diagnosis not present

## 2019-01-19 ENCOUNTER — Other Ambulatory Visit: Payer: Self-pay

## 2019-01-19 ENCOUNTER — Ambulatory Visit
Admission: RE | Admit: 2019-01-19 | Discharge: 2019-01-19 | Disposition: A | Discharge status: Home / Self Care | Payer: Medicare Other | Source: Ambulatory Visit | Attending: Radiation Oncology | Admitting: Radiation Oncology

## 2019-01-19 DIAGNOSIS — C8206 Follicular lymphoma grade I, intrapelvic lymph nodes: Secondary | ICD-10-CM | POA: Diagnosis not present

## 2019-01-20 ENCOUNTER — Other Ambulatory Visit: Payer: Self-pay

## 2019-01-20 ENCOUNTER — Ambulatory Visit
Admission: RE | Admit: 2019-01-20 | Discharge: 2019-01-20 | Disposition: A | Discharge status: Home / Self Care | Payer: Medicare Other | Source: Ambulatory Visit | Attending: Radiation Oncology | Admitting: Radiation Oncology

## 2019-01-20 DIAGNOSIS — C8206 Follicular lymphoma grade I, intrapelvic lymph nodes: Secondary | ICD-10-CM | POA: Diagnosis not present

## 2019-01-21 ENCOUNTER — Other Ambulatory Visit: Payer: Self-pay

## 2019-01-21 ENCOUNTER — Ambulatory Visit
Admission: RE | Admit: 2019-01-21 | Discharge: 2019-01-21 | Disposition: A | Discharge status: Home / Self Care | Payer: Medicare Other | Source: Ambulatory Visit | Attending: Radiation Oncology | Admitting: Radiation Oncology

## 2019-01-21 DIAGNOSIS — C8206 Follicular lymphoma grade I, intrapelvic lymph nodes: Secondary | ICD-10-CM | POA: Diagnosis not present

## 2019-01-22 ENCOUNTER — Ambulatory Visit
Admission: RE | Admit: 2019-01-22 | Discharge: 2019-01-22 | Disposition: A | Discharge status: Home / Self Care | Payer: Medicare Other | Source: Ambulatory Visit | Attending: Radiation Oncology | Admitting: Radiation Oncology

## 2019-01-22 ENCOUNTER — Other Ambulatory Visit: Payer: Self-pay

## 2019-01-22 DIAGNOSIS — C8206 Follicular lymphoma grade I, intrapelvic lymph nodes: Secondary | ICD-10-CM | POA: Diagnosis not present

## 2019-01-23 ENCOUNTER — Other Ambulatory Visit: Payer: Self-pay

## 2019-01-23 ENCOUNTER — Ambulatory Visit
Admission: RE | Admit: 2019-01-23 | Discharge: 2019-01-23 | Disposition: A | Discharge status: Home / Self Care | Payer: Medicare Other | Source: Ambulatory Visit | Attending: Radiation Oncology | Admitting: Radiation Oncology

## 2019-01-23 DIAGNOSIS — C8206 Follicular lymphoma grade I, intrapelvic lymph nodes: Secondary | ICD-10-CM | POA: Diagnosis not present

## 2019-01-26 ENCOUNTER — Ambulatory Visit
Admission: RE | Admit: 2019-01-26 | Discharge: 2019-01-26 | Disposition: A | Discharge status: Home / Self Care | Payer: Medicare Other | Source: Ambulatory Visit | Attending: Radiation Oncology | Admitting: Radiation Oncology

## 2019-01-26 ENCOUNTER — Other Ambulatory Visit: Payer: Self-pay

## 2019-01-26 DIAGNOSIS — C8206 Follicular lymphoma grade I, intrapelvic lymph nodes: Secondary | ICD-10-CM | POA: Diagnosis not present

## 2019-01-27 ENCOUNTER — Encounter: Payer: Self-pay | Admitting: Radiation Oncology

## 2019-01-27 ENCOUNTER — Ambulatory Visit
Admission: RE | Admit: 2019-01-27 | Discharge: 2019-01-27 | Disposition: A | Discharge status: Home / Self Care | Payer: Medicare Other | Source: Ambulatory Visit | Attending: Radiation Oncology | Admitting: Radiation Oncology

## 2019-01-27 ENCOUNTER — Other Ambulatory Visit: Payer: Self-pay

## 2019-01-27 DIAGNOSIS — C8206 Follicular lymphoma grade I, intrapelvic lymph nodes: Secondary | ICD-10-CM | POA: Diagnosis not present

## 2019-02-05 NOTE — Progress Notes (Signed)
HEMATOLOGY/ONCOLOGY CONSULTATION NOTE  Date of Service: 02/05/2019  Patient Care Team: Richard Barrack, MD as PCP - General (Family Medicine)  CHIEF COMPLAINTS/PURPOSE OF CONSULTATION:  F/u for follicular lymphoma  HISTORY OF PRESENTING ILLNESS:   Richard [name] is a wonderful 73 y.o. male who has been referred to [place] by Dr. Brantley [name] for evaluation and management of low grade follicular lymphoma. He is being accompanied by his wife, Richard [name] today. He notes that he first noticed a lump to his left inner thigh during a shower approximately 3-4 years ago and noted that it increased in size over the past couple of months.   He had an US of the LLE on 07/16/2018 showed: Soft tissue masses in the left inguinal region most compatible with enlarged lymph nodes/adenopathy, the largest measuring up to 6.1 cm.  On 08/20/2018, the patient underwent cytometry that showed: Tissue-Flow Cytometry with monoclonal B-cell population identified.   Lymph node biopsy on 08/20/2018 showed: Lymph node, needle/core biopsy, Left Inguinal with atypical lymphoid proliferation.   On 09/16/2018, the patient had biopsy completed with pathology showing: Lymph node for lymphoma, left inguinal with low grade follicular lymphoma.   PMHx, he has a hx of squamous cell carcinoma to bilateral sides of his face, none that is active.   SHx, he has had several cystoscopies within the past 4 months and his urologist is   On review of systems, he reports additional left inguinal lump x 4-5 months, intentional weight loss, recurrent kidney stones (most recently December 2019). he denies fever, chills, night sweats, no other areas of concerns, leg swelling, abdominal pain, flank pain, testicular pain/swelling, rashes, and any other symptoms. He hasn't had his prior stones analyzed to determine the cause of them. He has intentionally lost 45 lbs in the past couple of weeks through keto diet and intermittent fasting. He doesn't consume  bread or pasta. He works out with [employer] about every other day. His PCP is Richard Barrack, MD and his Urologist is Dr. Irine [name]. Pertinent positives are listed and detailed within the above HPI.   Interval History:   Richard [name] returns today for management and evaluation of his Follicular Lymphoma. The patient's last visit with [place] was on 12/08/2018. The pt reports that he is doing well overall.  The pt reports a change in taste. Everything tastes bland and he notes a bad after taste, especially when he drinks diet drinks. Pt also notes more frequent urination but he has been trying to drink more water. Denies discomfort with urination and prostate problems. His bowels are moving well. Pt is intentionally losing some weight and lifts weights regularly.  Lab results today (02/10/2019) of CBC w/diff and CMP is as follows: all values are WNL except for Lymph Abs at 0.6k, glucose bld at 100, BUN at 40, Creatinine at 1.54, and GFR at 45.  On review of systems, pt reports a change in taste, bowels moving well, intentional weight loss, and denies discomfort with urination, prostate problems, and any other symptoms.    MEDICAL HISTORY:  Past Medical History:  Diagnosis Date   Aneurysm ([place])    left common iliac-stable   Arthritis    Fasting hyperglycemia    History of kidney stones    Hyperlipidemia    LDL goal = < 100 based on NMR Lipoproprofile   Hypertension    Mass of left lower extremity    inner thigh   Nephrolithiasis    left  Renal insufficiency    Sepsis ([place]) 03/24/2013   Squamous cell carcinoma, face    Dr Richard [name]   Ureteral stone with hydronephrosis 03/24/2013   left    SURGICAL HISTORY: Past Surgical History:  Procedure Laterality Date   CATARACT EXTRACTION  2005   OD    COLONOSCOPY W/ POLYPECTOMY  2007   adenoma X2 ; [facility]   CYSTOSCOPY W/ URETERAL STENT PLACEMENT Left 03/23/2013   Procedure: CYSTOSCOPY WITH RETROGRADE PYELOGRAM/URETERAL  STENT PLACEMENT;  Surgeon: Richard Bonine, MD;  [facility];  Service: Urology;  Laterality: Left;   CYSTOSCOPY WITH RETROGRADE PYELOGRAM, URETEROSCOPY AND STENT PLACEMENT Bilateral 07/15/2018   Procedure: CYSTOSCOPY WITH RETROGRADE PYELOGRAM, URETEROSCOPY;  Surgeon: Richard Seal, MD;  [facility];  Service: Urology;  Laterality: Bilateral;   CYSTOSCOPY WITH STENT PLACEMENT Left 07/15/2018   Procedure: CYSTOSCOPY WITH STENT PLACEMENT;  Surgeon: Richard Seal, MD;  [facility];  Service: Urology;  Laterality: Left;   CYSTOSCOPY WITH URETEROSCOPY  02/28/2012   Procedure: CYSTOSCOPY WITH URETEROSCOPY;  Surgeon: Richard So, MD;  [facility];  Service: Urology;  Laterality: Left;   CYSTOSCOPY/URETEROSCOPY/HOLMIUM LASER/STENT PLACEMENT Right 07/31/2018   Procedure: CYSTOSCOPY STENT REMOVAL  RIGHT URETEROSCOPY WITH HOLMIUM LASER POSSIBLE STENT PLACEMENT;  Surgeon: Richard Seal, MD;  Location: [facility];  Service: Urology;  Laterality: Right;   HOLMIUM LASER APPLICATION Left 10/04/9022   Procedure: HOLMIUM LASER APPLICATION;  Surgeon: Richard So, MD;  [facility];  Service: Urology;  Laterality: Left;   HOLMIUM LASER APPLICATION Right 09/73/5329   Procedure: HOLMIUM LASER APPLICATION;  Surgeon: Richard Seal, MD;  [facility];  Service: Urology;  Laterality: Right;   INGUINAL LYMPH NODE BIOPSY Left 09/16/2018   Procedure: LEFT INGUINAL LYMPH NODE BIOPSY;  Surgeon: Richard Luna, MD;  Location: [place] [facility];  Service: General;  Laterality: Left;   LITHOTRIPSY      X2 w/o benefit   NEPHROLITHOTOMY  02/28/2012   Procedure: NEPHROLITHOTOMY PERCUTANEOUS;  Surgeon: Richard So, MD;  [facility];  Service: Urology;  Laterality: Left;   TONSILLECTOMY     URETEROSCOPY Left 03/31/2013   Procedure: URETEROSCOPY;  Surgeon: Richard So, MD;  [facility];  Service: Urology;  Laterality: Left;   VASECTOMY      SOCIAL HISTORY: Social  History   Socioeconomic History   Marital status: Married    Spouse name: Not on file   Number of children: 2   Years of education: 13   Highest education level: Not on file  Occupational History   Occupation: Facilities manager (retired)    Fish farm manager: [employer] Designer, fashion/clothing strain: Not on file   Food insecurity    Worry: Not on file    Inability: Not on file   Transportation needs    Medical: No    Non-medical: No  Tobacco Use   Smoking status: Never Smoker   Smokeless tobacco: Never Used  Substance and Sexual Activity   Alcohol use: Yes    Comment:  rarely   Drug use: No   Sexual activity: Not on file  Lifestyle   Physical activity    Days per week: Not on file    Minutes per session: Not on file   Stress: Not on file  Relationships   Social connections    Talks on phone: Not on file    Gets together: Not on file    Attends religious service:  Not on file    Active member of club or organization: Not on file    Attends meetings of clubs or organizations: Not on file    Relationship status: Not on file   Intimate partner violence    Fear of current or ex partner: No    Emotionally abused: No    Physically abused: No    Forced sexual activity: No  Other Topics Concern   Not on file  Social History Narrative   Fun: Work around the house and work out.     FAMILY HISTORY: Family History  Problem Relation Age of Onset   Heart attack Father 43        CBAG X5 vessels   Hyperlipidemia Father    Hypertension Father    Heart disease Father        before age 67   Hypertension Brother    Aortic aneurysm Paternal Uncle        AAA ruptured   Heart disease Mother    Hyperlipidemia Mother    Hypertension Mother    Diabetes Neg Hx     ALLERGIES:  is allergic to angiotensin receptor blockers; ciprofloxacin; and pyridium [phenazopyridine].  MEDICATIONS:  Current Outpatient Medications  Medication Sig Dispense  Refill   amLODipine (NORVASC) 5 MG tablet TAKE 1 TABLET BY MOUTH EVERY DAY (Patient taking differently: Take 5 mg by mouth daily. ) 90 tablet 3   atorvastatin (LIPITOR) 20 MG tablet Take 1 tablet (20 mg total) by mouth See admin instructions. Take 20 mg by mouth on Monday, Wednesday, Friday and Sunday 51 tablet 3   HYDROcodone-acetaminophen (NORCO/VICODIN) 5-325 MG tablet Take 1 tablet by mouth every 6 (six) hours as needed for moderate pain. 12 tablet 0   metoprolol tartrate (LOPRESSOR) 25 MG tablet TAKE 1 TABLET BY MOUTH TWICE A DAY (Patient taking differently: Take 25 mg by mouth 2 (two) times daily. ) 180 tablet 3   tamsulosin (FLOMAX) 0.4 MG CAPS capsule TAKE 1 CAPSULE BY MOUTH EVERYDAY AT BEDTIME     No current facility-administered medications for this visit.     REVIEW OF SYSTEMS:    A 10+ POINT REVIEW OF SYSTEMS WAS OBTAINED including neurology, dermatology, psychiatry, cardiac, respiratory, lymph, extremities, GI, GU, Musculoskeletal, constitutional, breasts, reproductive, HEENT.  All pertinent positives are noted in the HPI.  All others are negative.   PHYSICAL EXAMINATION:  There were no vitals filed for this visit. There were no vitals filed for this visit. .There is no height or weight on file to calculate BMI.   GENERAL:alert, in no acute distress and comfortable SKIN: no acute rashes, no significant lesions EYES: conjunctiva are pink and non-injected, sclera anicteric OROPHARYNX: MMM, no exudates, no oropharyngeal erythema or ulceration NECK: supple, no JVD LYMPH: left inguinal mass barely palpable. No palpable lymphadenopathy in the cervical or axillary regions. LUNGS: clear to auscultation b/l with normal respiratory effort HEART: regular rate & rhythm ABDOMEN:  normoactive bowel sounds , non tender, not distended. No palpable hepatosplenomegaly.  Extremity: no pedal edema PSYCH: alert & oriented x 3 with fluent speech NEURO: no focal motor/sensory deficits     LABORATORY DATA:  I have reviewed the data as listed  . CBC Latest Ref Rng & Units 12/08/2018 09/29/2018 09/11/2018  WBC 4.0 - 10.5 K/uL 6.4 7.5 6.6  Hemoglobin 13.0 - 17.0 g/dL 14.2 13.7 14.0  Hematocrit 39.0 - 52.0 % 45.0 44.3 43.8  Platelets 150 - 400 K/uL 183 206 182    .  CMP Latest Ref Rng & Units 12/08/2018 09/29/2018 09/11/2018  Glucose 70 - 99 mg/dL 103(H) 95 95  BUN 8 - 23 mg/dL 35(H) 27(H) 25(H)  Creatinine 0.61 - 1.24 mg/dL 1.55(H) 1.58(H) 1.64(H)  Sodium 135 - 145 mmol/L 141 142 139  Potassium 3.5 - 5.1 mmol/L 5.3(H) 4.3 4.9  Chloride 98 - 111 mmol/L 105 105 103  CO2 22 - 32 mmol/L 29 27 29   Calcium 8.9 - 10.3 mg/dL 9.5 9.0 9.1  Total Protein 6.5 - 8.1 g/dL 6.6 6.6 6.1(L)  Total Bilirubin 0.3 - 1.2 mg/dL 0.5 0.6 0.9  Alkaline Phos 38 - 126 U/L 66 62 47  AST 15 - 41 U/L 17 14(L) 19  ALT 0 - 44 U/L 16 11 17       RADIOGRAPHIC STUDIES: I have personally reviewed the radiological images as listed and agreed with the findings in the report. No results found.  ASSESSMENT & PLAN:   1. Low-grade follicular lymphoma- [name] I to II -First noticed a left inner thigh lump approximately 3-4 years ago and noted that it increased in size over the past couple of months.  -He had an US of the LLE on 07/16/2018 showed: Soft tissue masses in the left inguinal region most compatible with enlarged lymph nodes/adenopathy, the largest measuring up to 6.1 cm. 08/20/2018 Tissue-Flow Cytometry with monoclonal B-cell population identified.  08/20/2018 LN Biopsy revealed Left Inguinal with atypical lymphoid proliferation.  09/16/2018: Lymph node biopsy, left inguinal with low grade follicular lymphoma.   09/29/18 Hep B and Hep C negative 10/08/18 PET/CT revealed Left inguinal and external iliac hypermetabolic adenopathy, consistent with active lymphoma. (Deauville 4). 2. No extrapelvic disease identified. 3. Coronary artery atherosclerosis. Aortic Atherosclerosis. 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  PLAN:  -Discussed pt labwork today, 02/10/2019; results are stable. Creatinine is slightly elevated. -Discussed more frequent urination. Consider urine test if symptoms worsen. -Recommended drinking more water. -Advised decreasing potassium consumption -Other option is watchful observation and likely later IV Chemo-immunotherapy if symptomatic. -The pt prefers a wait and watch approach at this time but will consider if he would like a referral to [facility] for further consideration in the interim -Limited CT of abdomen/pelvis before F/U in 6 months -Will see the pt back in 6 months   CT abd/pelvis in 24 weeks RTC with dr Irene Limbo with labs in 6 months   All of the patients questions were answered with apparent satisfaction. The patient knows to call the clinic with any problems, questions or concerns.  The total time spent in the appt was 15 minutes and more than 50% was on counseling and direct patient cares.    Sullivan Lone MD MS AAHIVMS [facility] Hematology/Oncology Physician [facility]  (Office):       [phone] (Work cell):  [phone] (Fax):           [phone]  02/05/2019 11:58 AM  By signing my name below, I, De Burrs, attest that this documentation has been prepared under the direction and in the presence of Irene Limbo, Cloria Spring, MD. Electronically Signed: De Burrs, Medical Scribe. 02/05/19. 11:58 AM.  .I have reviewed the above documentation for accuracy and completeness, and I agree with the above. Brunetta Genera MD

## 2019-02-10 ENCOUNTER — Other Ambulatory Visit: Payer: Self-pay

## 2019-02-10 ENCOUNTER — Inpatient Hospital Stay (HOSPITAL_BASED_OUTPATIENT_CLINIC_OR_DEPARTMENT_OTHER): Payer: Medicare Other | Admitting: Hematology

## 2019-02-10 ENCOUNTER — Inpatient Hospital Stay: Payer: Medicare Other | Attending: Hematology

## 2019-02-10 VITALS — BP 129/81 | HR 54 | Temp 98.7°F | Resp 18 | Ht 73.0 in | Wt 189.9 lb

## 2019-02-10 DIAGNOSIS — C8295 Follicular lymphoma, unspecified, lymph nodes of inguinal region and lower limb: Secondary | ICD-10-CM | POA: Insufficient documentation

## 2019-02-10 DIAGNOSIS — C8206 Follicular lymphoma grade I, intrapelvic lymph nodes: Secondary | ICD-10-CM

## 2019-02-10 LAB — CBC WITH DIFFERENTIAL/PLATELET
Abs Immature Granulocytes: 0.01 10*3/uL (ref 0.00–0.07)
Basophils Absolute: 0 10*3/uL (ref 0.0–0.1)
Basophils Relative: 1 %
Eosinophils Absolute: 0.2 10*3/uL (ref 0.0–0.5)
Eosinophils Relative: 5 %
HCT: 43.8 % (ref 39.0–52.0)
Hemoglobin: 14 g/dL (ref 13.0–17.0)
Immature Granulocytes: 0 %
Lymphocytes Relative: 15 %
Lymphs Abs: 0.6 10*3/uL — ABNORMAL LOW (ref 0.7–4.0)
MCH: 27.1 pg (ref 26.0–34.0)
MCHC: 32 g/dL (ref 30.0–36.0)
MCV: 84.7 fL (ref 80.0–100.0)
Monocytes Absolute: 0.4 10*3/uL (ref 0.1–1.0)
Monocytes Relative: 10 %
Neutro Abs: 3 10*3/uL (ref 1.7–7.7)
Neutrophils Relative %: 69 %
Platelets: 150 10*3/uL (ref 150–400)
RBC: 5.17 MIL/uL (ref 4.22–5.81)
RDW: 14.7 % (ref 11.5–15.5)
WBC: 4.4 10*3/uL (ref 4.0–10.5)
nRBC: 0 % (ref 0.0–0.2)

## 2019-02-10 LAB — CMP (CANCER CENTER ONLY)
ALT: 18 U/L (ref 0–44)
AST: 21 U/L (ref 15–41)
Albumin: 4.2 g/dL (ref 3.5–5.0)
Alkaline Phosphatase: 58 U/L (ref 38–126)
Anion gap: 10 (ref 5–15)
BUN: 40 mg/dL — ABNORMAL HIGH (ref 8–23)
CO2: 26 mmol/L (ref 22–32)
Calcium: 8.9 mg/dL (ref 8.9–10.3)
Chloride: 106 mmol/L (ref 98–111)
Creatinine: 1.54 mg/dL — ABNORMAL HIGH (ref 0.61–1.24)
GFR, Est AFR Am: 52 mL/min — ABNORMAL LOW (ref >60)
GFR, Estimated: 45 mL/min — ABNORMAL LOW (ref >60)
Glucose, Bld: 100 mg/dL — ABNORMAL HIGH (ref 70–99)
Potassium: 4.6 mmol/L (ref 3.5–5.1)
Sodium: 142 mmol/L (ref 135–145)
Total Bilirubin: 0.7 mg/dL (ref 0.3–1.2)
Total Protein: 6.5 g/dL (ref 6.5–8.1)

## 2019-02-11 ENCOUNTER — Telehealth: Payer: Self-pay | Admitting: Hematology

## 2019-02-11 NOTE — Telephone Encounter (Signed)
Scheduled appt per 7/14 lo.s °Printed and mailed appt calendar. °

## 2019-02-26 NOTE — Progress Notes (Signed)
I called the patient today about their upcoming follow-up appointment in radiation oncology.   Given concerns about the COVID-19 pandemic, I offered a phone assessment with the patient to determine if coming to the clinic was necessary. The patient accepted.  I let the patient know that I had spoken with Dr. Isidore Moos, and she wanted them to know the importance of washing their hands for at least 20 seconds at a time, especially after going out in public, and before they eat. Limit going out in public whenever possible. Do not touch your face, unless your hands are clean, such as when bathing. Get plenty of rest, eat well, and stay hydrated.   Symptomatically, the patient is doing relatively well. They report feeling well after radiation with no concerns at this time.   All questions were answered to the patient's satisfaction.  I encouraged the patient to call with any further questions. Otherwise, the plan is to follow up with Dr. Irene Limbo on 08/13/19.    Patient is pleased with this plan, and we will cancel their upcoming follow-up to reduce the risk of COVID-19 transmission.

## 2019-02-27 ENCOUNTER — Ambulatory Visit
Admission: RE | Admit: 2019-02-27 | Discharge: 2019-02-27 | Disposition: A | Discharge status: Home / Self Care | Payer: Medicare Other | Source: Ambulatory Visit | Attending: Radiation Oncology | Admitting: Radiation Oncology

## 2019-03-12 NOTE — Progress Notes (Signed)
  Patient Name: Richard [name] MRN: [id number] DOB: 10-Jul-1946 Referring Physician: Sullivan Lone (Profile Not Attached) Date of Service: 01/27/2019 [facility], [place]                                                        End Of Treatment Note  Diagnoses:    ICD-10-CM   1. Follicular lymphoma grade I of intrapelvic lymph nodes (HCC) C82.06    Cancer Staging: Stage IIA  Intent: Curative  Radiation Treatment Dates: 01/05/2019 through 01/27/2019 Site Technique Total Dose (Gy) Dose per Fx Completed Fx Beam Energies  Pelvis: Pelvis_Lt IMRT 30.6/30.6 1.8 17/17 6X   Narrative: The patient tolerated radiation therapy relatively well. He experienced mild fatigue and reports the enlarged lymph nodes in the left groin area have decreased in size since beginning radiation. He denied any other issues or side effects associated with this treatment.  Plan: The patient will follow-up with radiation oncology in one month.  ________________________________________________  Eppie Gibson, MD  This document serves as a record of services personally performed by Eppie Gibson, MD. It was created on her behalf by Rae Lips, a trained medical scribe. The creation of this record is based on the scribe's personal observations and the provider's statements to them. This document has been checked and approved by the attending provider.

## 2019-03-26 ENCOUNTER — Telehealth: Payer: Self-pay | Admitting: Family Medicine

## 2019-03-26 NOTE — Telephone Encounter (Signed)
I called the patient to schedule AWV with Courtney, but there was no answer and no option to leave a message. VDM (Dee-Dee) °

## 2019-04-22 NOTE — Telephone Encounter (Signed)
Called and scheduled

## 2019-04-22 NOTE — Telephone Encounter (Signed)
Patient returning call to schedule AWV.  

## 2019-04-24 ENCOUNTER — Ambulatory Visit (INDEPENDENT_AMBULATORY_CARE_PROVIDER_SITE_OTHER): Payer: Medicare Other

## 2019-04-24 ENCOUNTER — Other Ambulatory Visit: Payer: Self-pay

## 2019-04-24 VITALS — BP 124/72 | Temp 97.9°F | Ht 73.0 in | Wt 185.4 lb

## 2019-04-24 DIAGNOSIS — Z0000 Encounter for general adult medical examination without abnormal findings: Secondary | ICD-10-CM

## 2019-04-24 NOTE — Progress Notes (Signed)
I have personally reviewed the Medicare Annual Wellness Visit and agree with the assessment and plan.  Algis Greenhouse. Jerline Pain, MD 04/24/2019 1:32 PM

## 2019-04-24 NOTE — Patient Instructions (Addendum)
Richard [name] , Thank you for taking time to come for your Medicare Wellness Visit. I appreciate your ongoing commitment to your health goals. Please review the following plan we discussed and let me know if I can assist you in the future.   Screening recommendations/referrals: Colorectal Screening: up to date; Cologuard 07/25/17   Vision and Dental Exams: Recommended annual ophthalmology exams for early detection of glaucoma and other disorders of the eye Recommended annual dental exams for proper oral hygiene  Vaccinations: Influenza vaccine: will get at pharmacy   Pneumococcal vaccine: recommended  Tdap vaccine: up to date 07/04/15  Shingles vaccine: Please call your insurance company to determine your out of pocket expense for the Shingrix vaccine. You may receive this vaccine at your local pharmacy.  Advanced directives: Advance directives discussed with you today. I have provided a copy for you to complete at home and have notarized. Once this is complete please bring a copy in to our office so we can scan it into your chart.  Goals: Recommend to drink at least 6-8 8oz glasses of water per day.  Next appointment: Please schedule your Annual Wellness Visit with your Nurse Health Advisor in one year.  Please schedule a follow up with Dr. Jerline Pain for November.  I will make sure that your medications are refilled to last until you come in.   Preventive Care 34 Years and Older, Male Preventive care refers to lifestyle choices and visits with your health care provider that can promote health and wellness. What does preventive care include?  A yearly physical exam. This is also called an annual well check.  Dental exams once or twice a year.  Routine eye exams. Ask your health care provider how often you should have your eyes checked.  Personal lifestyle choices, including:  Daily care of your teeth and gums.  Regular physical activity.  Eating a healthy diet.  Avoiding tobacco  and drug use.  Limiting alcohol use.  Practicing safe sex.  Taking low doses of aspirin every day if recommended by your health care provider..  Taking vitamin and mineral supplements as recommended by your health care provider. What happens during an annual well check? The services and screenings done by your health care provider during your annual well check will depend on your age, overall health, lifestyle risk factors, and family history of disease. Counseling  Your health care provider may ask you questions about your:  Alcohol use.  Tobacco use.  Drug use.  Emotional well-being.  Home and relationship well-being.  Sexual activity.  Eating habits.  History of falls.  Memory and ability to understand (cognition).  Work and work Statistician. Screening  You may have the following tests or measurements:  Height, weight, and BMI.  Blood pressure.  Lipid and cholesterol levels. These may be checked every 5 years, or more frequently if you are over 71 years old.  Skin check.  Lung cancer screening. You may have this screening every year starting at age 59 if you have a 30-pack-year history of smoking and currently smoke or have quit within the past 15 years.  Fecal occult blood test (FOBT) of the stool. You may have this test every year starting at age 21.  Flexible sigmoidoscopy or colonoscopy. You may have a sigmoidoscopy every 5 years or a colonoscopy every 10 years starting at age 106.  Prostate cancer screening. Recommendations will vary depending on your family history and other risks.  Hepatitis C blood test.  Hepatitis B blood  test.  Sexually transmitted disease (STD) testing.  Diabetes screening. This is done by checking your blood sugar (glucose) after you have not eaten for a while (fasting). You may have this done every 1-3 years.  Abdominal aortic aneurysm (AAA) screening. You may need this if you are a current or former smoker.  Osteoporosis.  You may be screened starting at age 68 if you are at high risk. Talk with your health care provider about your test results, treatment options, and if necessary, the need for more tests. Vaccines  Your health care provider may recommend certain vaccines, such as:  Influenza vaccine. This is recommended every year.  Tetanus, diphtheria, and acellular pertussis (Tdap, Td) vaccine. You may need a Td booster every 10 years.  Zoster vaccine. You may need this after age 63.  Pneumococcal 13-valent conjugate (PCV13) vaccine. One dose is recommended after age 33.  Pneumococcal polysaccharide (PPSV23) vaccine. One dose is recommended after age 94. Talk to your health care provider about which screenings and vaccines you need and how often you need them. This information is not intended to replace advice given to you by your health care provider. Make sure you discuss any questions you have with your health care provider. Document Released: 08/12/2015 Document Revised: 04/04/2016 Document Reviewed: 05/17/2015 Elsevier Interactive Patient Education  2017 [place] Prevention in the Home Falls can cause injuries. They can happen to people of all ages. There are many things you can do to make your home safe and to help prevent falls. What can I do on the outside of my home?  Regularly fix the edges of walkways and driveways and fix any cracks.  Remove anything that might make you trip as you walk through a door, such as a raised step or threshold.  Trim any bushes or trees on the path to your home.  Use bright outdoor lighting.  Clear any walking paths of anything that might make someone trip, such as rocks or tools.  Regularly check to see if handrails are loose or broken. Make sure that both sides of any steps have handrails.  Any raised decks and porches should have guardrails on the edges.  Have any leaves, snow, or ice cleared regularly.  Use sand or salt on walking paths  during winter.  Clean up any spills in your garage right away. This includes oil or grease spills. What can I do in the bathroom?  Use night lights.  Install grab bars by the toilet and in the tub and shower. Do not use towel bars as grab bars.  Use non-skid mats or decals in the tub or shower.  If you need to sit down in the shower, use a plastic, non-slip stool.  Keep the floor dry. Clean up any water that spills on the floor as soon as it happens.  Remove soap buildup in the tub or shower regularly.  Attach bath mats securely with double-sided non-slip rug tape.  Do not have throw rugs and other things on the floor that can make you trip. What can I do in the bedroom?  Use night lights.  Make sure that you have a light by your bed that is easy to reach.  Do not use any sheets or blankets that are too big for your bed. They should not hang down onto the floor.  Have a firm chair that has side arms. You can use this for support while you get dressed.  Do not have throw rugs  and other things on the floor that can make you trip. What can I do in the kitchen?  Clean up any spills right away.  Avoid walking on wet floors.  Keep items that you use a lot in easy-to-reach places.  If you need to reach something above you, use a strong step stool that has a grab bar.  Keep electrical cords out of the way.  Do not use floor polish or wax that makes floors slippery. If you must use wax, use non-skid floor wax.  Do not have throw rugs and other things on the floor that can make you trip. What can I do with my stairs?  Do not leave any items on the stairs.  Make sure that there are handrails on both sides of the stairs and use them. Fix handrails that are broken or loose. Make sure that handrails are as long as the stairways.  Check any carpeting to make sure that it is firmly attached to the stairs. Fix any carpet that is loose or worn.  Avoid having throw rugs at the top  or bottom of the stairs. If you do have throw rugs, attach them to the floor with carpet tape.  Make sure that you have a light switch at the top of the stairs and the bottom of the stairs. If you do not have them, ask someone to add them for you. What else can I do to help prevent falls?  Wear shoes that:  Do not have high heels.  Have rubber bottoms.  Are comfortable and fit you well.  Are closed at the toe. Do not wear sandals.  If you use a stepladder:  Make sure that it is fully opened. Do not climb a closed stepladder.  Make sure that both sides of the stepladder are locked into place.  Ask someone to hold it for you, if possible.  Clearly mark and make sure that you can see:  Any grab bars or handrails.  First and last steps.  Where the edge of each step is.  Use tools that help you move around (mobility aids) if they are needed. These include:  Canes.  Walkers.  Scooters.  Crutches.  Turn on the lights when you go into a dark area. Replace any light bulbs as soon as they burn out.  Set up your furniture so you have a clear path. Avoid moving your furniture around.  If any of your floors are uneven, fix them.  If there are any pets around you, be aware of where they are.  Review your medicines with your doctor. Some medicines can make you feel dizzy. This can increase your chance of falling. Ask your doctor what other things that you can do to help prevent falls. This information is not intended to replace advice given to you by your health care provider. Make sure you discuss any questions you have with your health care provider. Document Released: 05/12/2009 Document Revised: 12/22/2015 Document Reviewed: 08/20/2014 Elsevier Interactive Patient Education  2017 [employer].

## 2019-04-24 NOTE — Progress Notes (Signed)
Subjective:   Richard [name] is a 73 y.o. male who presents for Medicare Annual/Subsequent preventive examination.  Review of Systems:   Cardiac Risk Factors include: advanced age (>16men, >63 women);hypertension;dyslipidemia;male gender     Objective:    Vitals: BP 124/72 (BP Location: Left Arm, Patient Position: Sitting, Cuff Size: Normal)   Temp 97.9 F (36.6 C) (Temporal)   Ht 6\' 1"  (1.854 m)   Wt 185 lb 6.4 oz (84.1 kg)   BMI 24.46 kg/m   Body mass index is 24.46 kg/m.  Advanced Directives 04/24/2019 12/16/2018 09/08/2018 08/20/2018 08/20/2018 07/31/2018 07/15/2018  Does Patient Have a Medical Advance Directive? Yes No No No No No No  Type of Advance Directive Living will;Healthcare Power of Attorney - - - - - -  Does patient want to make changes to medical advance directive? No - Patient declined - - - - - -  Copy of [place] in Chart? No - copy requested - - - - - -  Would patient like information on creating a medical advance directive? - No - Patient declined No - Patient declined No - Patient declined - No - Patient declined No - Patient declined  Pre-existing out of facility DNR order (yellow form or pink MOST form) - - - - - - -    Tobacco Social History   Tobacco Use  Smoking Status Never Smoker  Smokeless Tobacco Never Used     Counseling given: Not Answered   Clinical Intake:  Pre-visit preparation completed: Yes   Diabetes: No  How often do you need to have someone help you when you read instructions, pamphlets, or other written materials from your doctor or pharmacy?: 1 - Never  Interpreter Needed?: No  Information entered by :: Denman George LPN  Past Medical History:  Diagnosis Date  . Aneurysm ([place])    left common iliac-stable  . Arthritis   . Fasting hyperglycemia   . History of kidney stones   . Hyperlipidemia    LDL goal = < 100 based on NMR Lipoproprofile  . Hypertension   . Mass of left lower extremity    inner  thigh  . Nephrolithiasis    left   . Renal insufficiency   . Sepsis ([place]) 03/24/2013  . Squamous cell carcinoma, face    Dr Sarajane Jews  . Ureteral stone with hydronephrosis 03/24/2013   left   Past Surgical History:  Procedure Laterality Date  . CATARACT EXTRACTION  2005   OD   . COLONOSCOPY W/ POLYPECTOMY  2007   adenoma X2 ; [facility]  . CYSTOSCOPY W/ URETERAL STENT PLACEMENT Left 03/23/2013   Procedure: CYSTOSCOPY WITH RETROGRADE PYELOGRAM/URETERAL STENT PLACEMENT;  Surgeon: Fredricka Bonine, MD;  Location: [facility];  Service: Urology;  Laterality: Left;  . CYSTOSCOPY WITH RETROGRADE PYELOGRAM, URETEROSCOPY AND STENT PLACEMENT Bilateral 07/15/2018   Procedure: CYSTOSCOPY WITH RETROGRADE PYELOGRAM, URETEROSCOPY;  Surgeon: Irine Seal, MD;  Location: [facility];  Service: Urology;  Laterality: Bilateral;  . CYSTOSCOPY WITH STENT PLACEMENT Left 07/15/2018   Procedure: CYSTOSCOPY WITH STENT PLACEMENT;  Surgeon: Irine Seal, MD;  Location: [facility];  Service: Urology;  Laterality: Left;  . CYSTOSCOPY WITH URETEROSCOPY  02/28/2012   Procedure: CYSTOSCOPY WITH URETEROSCOPY;  Surgeon: Malka So, MD;  Location: [facility];  Service: Urology;  Laterality: Left;  . CYSTOSCOPY/URETEROSCOPY/HOLMIUM LASER/STENT PLACEMENT Right 07/31/2018   Procedure: CYSTOSCOPY STENT REMOVAL  RIGHT URETEROSCOPY WITH HOLMIUM LASER POSSIBLE STENT PLACEMENT;  Surgeon: Irine Seal,  MD;  Location: [facility];  Service: Urology;  Laterality: Right;  . HOLMIUM LASER APPLICATION Left [id number]   Procedure: HOLMIUM LASER APPLICATION;  Surgeon: Malka So, MD;  Location: [facility];  Service: Urology;  Laterality: Left;  . HOLMIUM LASER APPLICATION Right [id number]   Procedure: HOLMIUM LASER APPLICATION;  Surgeon: Irine Seal, MD;  Location: [facility];  Service: Urology;  Laterality: Right;  . INGUINAL LYMPH NODE BIOPSY Left 09/16/2018   Procedure: LEFT INGUINAL LYMPH NODE BIOPSY;  Surgeon: Erroll Luna, MD;  Location:  [facility];  Service: General;  Laterality: Left;  . LITHOTRIPSY      X2 w/o benefit  . NEPHROLITHOTOMY  02/28/2012   Procedure: NEPHROLITHOTOMY PERCUTANEOUS;  Surgeon: Malka So, MD;  Location: [facility];  Service: Urology;  Laterality: Left;  . TONSILLECTOMY    . URETEROSCOPY Left 03/31/2013   Procedure: URETEROSCOPY;  Surgeon: Malka So, MD;  Location[facility];  Service: Urology;  Laterality: Left;  Marland Kitchen VASECTOMY     Family History  Problem Relation Age of Onset  . Heart attack Father 31        CBAG X5 vessels  . Hyperlipidemia Father   . Hypertension Father   . Heart disease Father        before age 19  . Hypertension Brother   . Aortic aneurysm Paternal Uncle        AAA ruptured  . Heart disease Mother   . Hyperlipidemia Mother   . Hypertension Mother   . Diabetes Neg Hx    Social History   Socioeconomic History  . Marital status: Married    Spouse name: Not on file  . Number of children: 2  . Years of education: 62  . Highest education level: Not on file  Occupational History  . Occupation: Facilities manager (retired)    Fish farm manager: [employer]  . Financial resource strain: Not on file  . Food insecurity    Worry: Not on file    Inability: Not on file  . Transportation needs    Medical: No    Non-medical: No  Tobacco Use  . Smoking status: Never Smoker  . Smokeless tobacco: Never Used  Substance and Sexual Activity  . Alcohol use: Yes    Comment:  rarely  . Drug use: No  . Sexual activity: Not on file  Lifestyle  . Physical activity    Days per week: Not on file    Minutes per session: Not on file  . Stress: Not on file  Relationships  . Social Herbalist on phone: Not on file    Gets together: Not on file    Attends religious service: Not on file    Active member of club or organization: Not on file    Attends meetings of clubs or organizations: Not on file    Relationship status: Not on file  Other Topics  Concern  . Not on file  Social History Narrative   Fun: Work around [employer] and work out.     Outpatient Encounter Medications as of 04/24/2019  Medication Sig  . amLODipine (NORVASC) 5 MG tablet TAKE 1 TABLET BY MOUTH EVERY DAY (Patient taking differently: Take 5 mg by mouth daily. )  . atorvastatin (LIPITOR) 20 MG tablet Take 1 tablet (20 mg total) by mouth See admin instructions. Take 20 mg by mouth on Monday, Wednesday, Friday and Sunday  . HYDROcodone-acetaminophen (NORCO/VICODIN) 5-325  MG tablet Take 1 tablet by mouth every 6 (six) hours as needed for moderate pain.  . metoprolol tartrate (LOPRESSOR) 25 MG tablet TAKE 1 TABLET BY MOUTH TWICE A DAY (Patient taking differently: Take 25 mg by mouth 2 (two) times daily. )  . tamsulosin (FLOMAX) 0.4 MG CAPS capsule TAKE 1 CAPSULE BY MOUTH EVERYDAY AT BEDTIME   No facility-administered encounter medications on file as of 04/24/2019.     Activities of Daily Living In your present state of health, do you have any difficulty performing the following activities: 04/24/2019 08/20/2018  Hearing? N N  Vision? N N  Difficulty concentrating or making decisions? N N  Walking or climbing stairs? N Y  Dressing or bathing? N N  Doing errands, shopping? N -  Preparing Food and eating ? N -  Using the Toilet? N -  In the past six months, have you accidently leaked urine? N -  Do you have problems with loss of bowel control? N -  Managing your Medications? N -  Managing your Finances? N -  Housekeeping or managing your Housekeeping? N -  Some recent data might be hidden    Patient Care Team: Vivi Barrack, MD as PCP - General (Family Medicine) Brunetta Genera, MD as Consulting Physician (Hematology) Irine Seal, MD as Attending Physician (Urology) Dyke Maes, OD as Consulting Physician (Optometry)   Assessment:   This is a routine wellness examination for Jibran.  Exercise Activities and Dietary recommendations Current Exercise  Habits: The patient does not participate in regular exercise at present;Home exercise routine, Type of exercise: strength training/weights, Time (Minutes): 45, Frequency (Times/Week): 7, Weekly Exercise (Minutes/Week): 315, Intensity: Moderate  Goals   None     Fall Risk Fall Risk  04/24/2019 06/26/2017 05/15/2013  Falls in the past year? 0 No No  Number falls in past yr: 0 - -  Injury with Fall? 0 - -  Follow up Falls prevention discussed;Education provided;Falls evaluation completed - -   Is the patient's home free of loose throw rugs in walkways, pet beds, electrical cords, etc?   yes      Grab bars in the bathroom? yes      Handrails on the stairs?   yes      Adequate lighting?   yes  Timed Get Up and Go Performed: completed and within normal timeframe; no gait abnormalities noted   Depression Screen PHQ 2/9 Scores 04/24/2019 06/17/2018 06/26/2017 05/15/2013  PHQ - 2 Score 0 0 0 0    Cognitive Function- no cognitive concerns at this time     Alert? Yes         Normal Appearance? Yes  Oriented to person? Yes           Place? Yes  Time? Yes  Recall of three objects? Yes  Can perform simple calculations? Yes  Displays appropriate judgment? Yes  Can read the correct time from a watch face? Yes        Immunization History  Administered Date(s) Administered  . Influenza Whole 04/29/2012  . Influenza, High Dose Seasonal PF 05/15/2013, 05/24/2016, 06/04/2017, 05/26/2018  . Influenza,inj,Quad PF,6+ Mos 07/04/2015  . Tdap 07/04/2015    Qualifies for Shingles Vaccine? Discussed and patient will check with pharmacy for coverage.  Patient education handout provided   Screening Tests Health Maintenance  Topic Date Due  . COLONOSCOPY  12/02/1997  . PNA vac Low Risk Adult (1 of 2 - PCV13) 12/02/2012  . INFLUENZA VACCINE  02/28/2019  . TETANUS/TDAP  07/03/2025  . Hepatitis C Screening  Completed   Cancer Screenings: Lung: Low Dose CT Chest recommended if Age 60-80 years,  30 pack-year currently smoking OR have quit w/in 15years. Patient does not qualify. Colorectal: Cologuard completed 07/25/17      Plan:    I have personally reviewed and addressed the Medicare Annual Wellness questionnaire and have noted the following in the patient's chart:  A. Medical and social history B. Use of alcohol, tobacco or illicit drugs  C. Current medications and supplements D. Functional ability and status E.  Nutritional status F.  Physical activity G. Advance directives H. List of other physicians I.  Hospitalizations, surgeries, and ER visits in previous 12 months J.  [place] such as hearing and vision if needed, cognitive and depression L. Referrals, records requested, and appointments- none   In addition, I have reviewed and discussed with patient certain preventive protocols, quality metrics, and best practice recommendations. A written personalized care plan for preventive services as well as general preventive health recommendations were provided to patient.   Signed,  Denman George, LPN  Nurse Health Advisor   Nurse Notes: Patient scheduled for follow up/ CPE with PCP.   Is asking for refills to last until appointment.  Is this ok?

## 2019-04-27 ENCOUNTER — Other Ambulatory Visit: Payer: Self-pay

## 2019-04-27 MED ORDER — TAMSULOSIN HCL 0.4 MG PO CAPS: ORAL_CAPSULE | ORAL | 1 refills | Status: DC

## 2019-04-27 MED ORDER — METOPROLOL TARTRATE 25 MG PO TABS: 25.0000 mg | ORAL_TABLET | Freq: Two times a day (BID) | ORAL | 3 refills | Status: DC

## 2019-04-27 MED ORDER — ATORVASTATIN CALCIUM 20 MG PO TABS: 20.0000 mg | ORAL_TABLET | ORAL | 3 refills | Status: DC

## 2019-05-11 ENCOUNTER — Other Ambulatory Visit: Payer: Self-pay

## 2019-05-11 ENCOUNTER — Ambulatory Visit (INDEPENDENT_AMBULATORY_CARE_PROVIDER_SITE_OTHER): Payer: Medicare Other | Admitting: Family Medicine

## 2019-05-11 ENCOUNTER — Encounter: Payer: Self-pay | Admitting: Family Medicine

## 2019-05-11 VITALS — BP 122/68 | HR 55 | Temp 97.4°F | Ht 73.0 in | Wt 185.4 lb

## 2019-05-11 DIAGNOSIS — E782 Mixed hyperlipidemia: Secondary | ICD-10-CM

## 2019-05-11 DIAGNOSIS — N1831 Chronic kidney disease, stage 3a: Secondary | ICD-10-CM

## 2019-05-11 DIAGNOSIS — Z0001 Encounter for general adult medical examination with abnormal findings: Secondary | ICD-10-CM

## 2019-05-11 DIAGNOSIS — G2581 Restless legs syndrome: Secondary | ICD-10-CM

## 2019-05-11 DIAGNOSIS — R7301 Impaired fasting glucose: Secondary | ICD-10-CM | POA: Diagnosis not present

## 2019-05-11 DIAGNOSIS — Z125 Encounter for screening for malignant neoplasm of prostate: Secondary | ICD-10-CM

## 2019-05-11 DIAGNOSIS — Z0000 Encounter for general adult medical examination without abnormal findings: Secondary | ICD-10-CM

## 2019-05-11 DIAGNOSIS — I10 Essential (primary) hypertension: Secondary | ICD-10-CM

## 2019-05-11 DIAGNOSIS — R5383 Other fatigue: Secondary | ICD-10-CM

## 2019-05-11 DIAGNOSIS — M545 Low back pain, unspecified: Secondary | ICD-10-CM

## 2019-05-11 DIAGNOSIS — N183 Chronic kidney disease, stage 3 unspecified: Secondary | ICD-10-CM | POA: Insufficient documentation

## 2019-05-11 DIAGNOSIS — M549 Dorsalgia, unspecified: Secondary | ICD-10-CM

## 2019-05-11 LAB — COMPREHENSIVE METABOLIC PANEL
ALT: 16 U/L (ref 0–53)
AST: 17 U/L (ref 0–37)
Albumin: 4.4 g/dL (ref 3.5–5.2)
Alkaline Phosphatase: 66 U/L (ref 39–117)
BUN: 34 mg/dL — ABNORMAL HIGH (ref 6–23)
CO2: 31 mEq/L (ref 19–32)
Calcium: 9.8 mg/dL (ref 8.4–10.5)
Chloride: 102 mEq/L (ref 96–112)
Creatinine, Ser: 1.6 mg/dL — ABNORMAL HIGH (ref 0.40–1.50)
GFR: 42.77 mL/min — ABNORMAL LOW (ref >60.00)
Glucose, Bld: 97 mg/dL (ref 70–99)
Potassium: 4.5 mEq/L (ref 3.5–5.1)
Sodium: 140 mEq/L (ref 135–145)
Total Bilirubin: 0.9 mg/dL (ref 0.2–1.2)
Total Protein: 6.3 g/dL (ref 6.0–8.3)

## 2019-05-11 LAB — TESTOSTERONE: Testosterone: 180.02 ng/dL — ABNORMAL LOW (ref 300.00–890.00)

## 2019-05-11 LAB — CBC
HCT: 43 % (ref 39.0–52.0)
Hemoglobin: 14.1 g/dL (ref 13.0–17.0)
MCHC: 32.8 g/dL (ref 30.0–36.0)
MCV: 84.7 fl (ref 78.0–100.0)
Platelets: 154 10*3/uL (ref 150.0–400.0)
RBC: 5.07 Mil/uL (ref 4.22–5.81)
RDW: 14.6 % (ref 11.5–15.5)
WBC: 4.5 10*3/uL (ref 4.0–10.5)

## 2019-05-11 LAB — LIPID PANEL
Cholesterol: 152 mg/dL (ref 0–200)
HDL: 65.4 mg/dL (ref >39.00)
LDL Cholesterol: 70 mg/dL (ref 0–99)
NonHDL: 86.43
Total CHOL/HDL Ratio: 2
Triglycerides: 84 mg/dL (ref 0.0–149.0)
VLDL: 16.8 mg/dL (ref 0.0–40.0)

## 2019-05-11 LAB — IBC + FERRITIN
Ferritin: 71.4 ng/mL (ref 22.0–322.0)
Iron: 118 ug/dL (ref 42–165)
Saturation Ratios: 40.9 % (ref 20.0–50.0)
Transferrin: 206 mg/dL — ABNORMAL LOW (ref 212.0–360.0)

## 2019-05-11 LAB — HEMOGLOBIN A1C: Hgb A1c MFr Bld: 5.8 % (ref 4.6–6.5)

## 2019-05-11 LAB — PSA: PSA: 1.29 ng/mL (ref 0.10–4.00)

## 2019-05-11 LAB — TSH: TSH: 3.5 u[IU]/mL (ref 0.35–4.50)

## 2019-05-11 MED ORDER — METOPROLOL TARTRATE 25 MG PO TABS: 12.5000 mg | ORAL_TABLET | Freq: Two times a day (BID) | ORAL | 3 refills | Status: DC

## 2019-05-11 MED ORDER — BACLOFEN 10 MG PO TABS: 10.0000 mg | ORAL_TABLET | Freq: Three times a day (TID) | ORAL | 0 refills | Status: DC | PRN

## 2019-05-11 NOTE — Assessment & Plan Note (Signed)
Check lipid panel.  Continue Lipitor 20 mg 4 times weekly.

## 2019-05-11 NOTE — Assessment & Plan Note (Signed)
At goal.  Will decrease metoprolol tartrate to 12.5 mg twice daily.  Continue Norvasc 5 mg daily.  Check CBC, C met, and TSH.

## 2019-05-11 NOTE — Assessment & Plan Note (Signed)
Check ferritin.  If ferritin level is normal and has ongoing issues, would consider trial of gabapentin.

## 2019-05-11 NOTE — Assessment & Plan Note (Signed)
Check A1c. 

## 2019-05-11 NOTE — Assessment & Plan Note (Signed)
No red flags.  Will start low-dose baclofen as needed.  Discussed potential side effects.

## 2019-05-11 NOTE — Patient Instructions (Signed)
It was very nice to see you today!  Please cut your metoprolol to 12.78m twice daily. Keep an eye on your BP and let me know if persistently 140/90 or higher.  Please use the baclofen as needed.  We will check blood work today.  Come back in 1 year for your next physical, or sooner if needed.   Take care, Dr PJerline Pain Please try these tips to maintain a healthy lifestyle:   Eat at least 3 REAL meals and 1-2 snacks per day.  Aim for no more than 5 hours between eating.  If you eat breakfast, please do so within one hour of getting up.    Obtain twice as many fruits/vegetables as protein or carbohydrate foods for both lunch and dinner. (Half of each meal should be fruits/vegetables, one quarter protein, and one quarter starchy carbs)   Cut down on sweet beverages. This includes juice, soda, and sweet tea.    Exercise at least 150 minutes every week.    Preventive Care 6104Years and Older, Male Preventive care refers to lifestyle choices and visits with your health care provider that can promote health and wellness. This includes:  A yearly physical exam. This is also called an annual well check.  Regular dental and eye exams.  Immunizations.  Screening for certain conditions.  Healthy lifestyle choices, such as diet and exercise. What can I expect for my preventive care visit? Physical exam Your health care provider will check:  Height and weight. These may be used to calculate body mass index (BMI), which is a measurement that tells if you are at a healthy weight.  Heart rate and blood pressure.  Your skin for abnormal spots. Counseling Your health care provider may ask you questions about:  Alcohol, tobacco, and drug use.  Emotional well-being.  Home and relationship well-being.  Sexual activity.  Eating habits.  History of falls.  Memory and ability to understand (cognition).  Work and work eStatistician What immunizations do I need?  Influenza (flu)  vaccine  This is recommended every year. Tetanus, diphtheria, and pertussis (Tdap) vaccine  You may need a Td booster every 10 years. Varicella (chickenpox) vaccine  You may need this vaccine if you have not already been vaccinated. Zoster (shingles) vaccine  You may need this after age 71 Pneumococcal conjugate (PCV13) vaccine  One dose is recommended after age 71 Pneumococcal polysaccharide (PPSV23) vaccine  One dose is recommended after age 71 Measles, mumps, and rubella (MMR) vaccine  You may need at least one dose of MMR if you were born in 1957 or later. You may also need a second dose. Meningococcal conjugate (MenACWY) vaccine  You may need this if you have certain conditions. Hepatitis A vaccine  You may need this if you have certain conditions or if you travel or work in places where you may be exposed to hepatitis A. Hepatitis B vaccine  You may need this if you have certain conditions or if you travel or work in places where you may be exposed to hepatitis B. Haemophilus influenzae type b (Hib) vaccine  You may need this if you have certain conditions. You may receive vaccines as individual doses or as more than one vaccine together in one shot (combination vaccines). Talk with your health care provider about the risks and benefits of combination vaccines. What tests do I need? Blood tests  Lipid and cholesterol levels. These may be checked every 5 years, or more frequently depending on your overall health.  Hepatitis C test.  Hepatitis B test. Screening  Lung cancer screening. You may have this screening every year starting at age 57 if you have a 30-pack-year history of smoking and currently smoke or have quit within the past 15 years.  Colorectal cancer screening. All adults should have this screening starting at age 81 and continuing until age 83. Your health care provider may recommend screening at age 73 if you are at increased risk. You will have  tests every 1-10 years, depending on your results and the type of screening test.  Prostate cancer screening. Recommendations will vary depending on your family history and other risks.  Diabetes screening. This is done by checking your blood sugar (glucose) after you have not eaten for a while (fasting). You may have this done every 1-3 years.  Abdominal aortic aneurysm (AAA) screening. You may need this if you are a current or former smoker.  Sexually transmitted disease (STD) testing. Follow these instructions at home: Eating and drinking  Eat a diet that includes fresh fruits and vegetables, whole grains, lean protein, and low-fat dairy products. Limit your intake of foods with high amounts of sugar, saturated fats, and salt.  Take vitamin and mineral supplements as recommended by your health care provider.  Do not drink alcohol if your health care provider tells you not to drink.  If you drink alcohol: ? Limit how much you have to 0-2 drinks a day. ? Be aware of how much alcohol is in your drink. In the [place] one drink equals one 12 oz bottle of beer (355 mL), one 5 oz glass of wine (148 mL), or one 1 oz glass of hard liquor (44 mL). Lifestyle  Take daily care of your teeth and gums.  Stay active. Exercise for at least 30 minutes on 5 or more days each week.  Do not use any products that contain nicotine or tobacco, such as cigarettes, e-cigarettes, and chewing tobacco. If you need help quitting, ask your health care provider.  If you are sexually active, practice safe sex. Use a condom or other form of protection to prevent STIs (sexually transmitted infections).  Talk with your health care provider about taking a low-dose aspirin or statin. What's next?  Visit your health care provider once a year for a well check visit.  Ask your health care provider how often you should have your eyes and teeth checked.  Stay up to date on all vaccines. This information is not  intended to replace advice given to you by your health care provider. Make sure you discuss any questions you have with your health care provider. Document Released: 08/12/2015 Document Revised: 07/10/2018 Document Reviewed: 07/10/2018 Elsevier Patient Education  2020 [employer].

## 2019-05-11 NOTE — Assessment & Plan Note (Signed)
Check C met. 

## 2019-05-11 NOTE — Progress Notes (Signed)
Chief Complaint:  Richard [name] is a 73 y.o. male who presents today for his annual comprehensive physical exam.    Assessment/Plan:  Fasting hyperglycemia Check A1c.  Essential hypertension At goal.  Will decrease metoprolol tartrate to 12.5 mg twice daily.  Continue Norvasc 5 mg daily.  Check CBC, C met, and TSH.  HYPERLIPIDEMIA Check lipid panel.  Continue Lipitor 20 mg 4 times weekly.  Restless legs syndrome Check ferritin.  If ferritin level is normal and has ongoing issues, would consider trial of gabapentin.  CKD (chronic kidney disease) stage 3, GFR 30-59 ml/min Check C met.  Intermittent low back pain No red flags.  Will start low-dose baclofen as needed.  Discussed potential side effects.  Fatigue Check CBC, C met, TSH  Preventative Healthcare: Up-to-date on vaccines and screenings.  Check CBC, C met, TSH, lipid panel, PSA, and testosterone.  Patient Counseling(The following topics were reviewed and/or handout was given):  -Nutrition: Stressed importance of moderation in sodium/caffeine intake, saturated fat and cholesterol, caloric balance, sufficient intake of fresh fruits, vegetables, and fiber.  -Stressed the importance of regular exercise.   -Substance Abuse: Discussed cessation/primary prevention of tobacco, alcohol, or other drug use; driving or other dangerous activities under the influence; availability of treatment for abuse.   -Injury prevention: Discussed safety belts, safety helmets, smoke detector, smoking near bedding or upholstery.   -Sexuality: Discussed sexually transmitted diseases, partner selection, use of condoms, avoidance of unintended pregnancy and contraceptive alternatives.   -Dental health: Discussed importance of regular tooth brushing, flossing, and dental visits.  -Health maintenance and immunizations reviewed. Please refer to Health maintenance section.  Return to care in 1 year for next preventative visit.     Subjective:  HPI:   He has been having more fatigue for the last few months.  He has currently underwent treatment for lymphoma which he thinks could be causing it.  He has also had some muscle loss as well.   Also having some issues with restless legs.  This usually occurs in the middle of the night.  Interferes with his ability to sleep.  Has noticed that his hydrocodone helps alleviate symptoms.  No other treatments tried.  No other obviously aggravating or alleviating factors.  Additionally has some low back pain.  This is a chronic problem however comes and goes.  Usually occurs a few hours after exercising.  He has been trying home stretches with out significant provement.  Feels like a tightness in his back.  Also feels like muscle spasms.    His stable, chronic medical conditions are outlined below:   # Essential Hypertension -On Norvasc 5 mg daily and metoprolol tartrate 25 mg twice daily.  Tolerating both well. - ROS: No reported chest pain or shortness of breath  # Dyslipidemia -On Lipitor 20 mg 4 times weekly.  Tolerating well - ROS: No reported myalgias  # BPH - On flomax 0.4 mg daily at bedtime.  % Follicular Lymphoma s/p radiation - Follows with oncology - Dr Irene Limbo  Lifestyle Diet: No specific diets or eating plans. Exercise: Works out daily.   Depression screen PHQ 2/9 04/24/2019  Decreased Interest 0  Down, Depressed, Hopeless 0  PHQ - 2 Score 0    There are no preventive care reminders to display for this patient.   ROS: Per HPI, otherwise a complete review of systems was negative.   PMH:  The following were reviewed and entered/updated in epic: Past Medical History:  Diagnosis Date  .  Aneurysm ([place])    left common iliac-stable  . Arthritis   . Fasting hyperglycemia   . History of kidney stones   . Hyperlipidemia    LDL goal = < 100 based on NMR Lipoproprofile  . Hypertension   . Mass of left lower extremity    inner thigh  . Nephrolithiasis    left   . Renal  insufficiency   . Sepsis ([place]) 03/24/2013  . Squamous cell carcinoma, face    Dr Sarajane Jews  . Ureteral stone with hydronephrosis 03/24/2013   left   Patient Active Problem List   Diagnosis Date Noted  . CKD (chronic kidney disease) stage 3, GFR 30-59 ml/min 05/11/2019  . Restless legs syndrome 05/11/2019  . Intermittent low back pain 05/11/2019  . Follicular lymphoma grade I of intrapelvic lymph nodes ([place]) 12/16/2018  . Erectile dysfunction 06/26/2017  . 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 ADENOMATOUS COLONIC POLYP 03/08/2009  . Fasting hyperglycemia 03/08/2009  . HYPERLIPIDEMIA 09/02/2007  . Essential hypertension 09/02/2007  . NEPHROLITHIASIS, HX OF 09/02/2007   Past Surgical History:  Procedure Laterality Date  . CATARACT EXTRACTION  2005   OD   . COLONOSCOPY W/ POLYPECTOMY  2007   adenoma X2 ; [facility]  . CYSTOSCOPY W/ URETERAL STENT PLACEMENT Left 03/23/2013   Procedure: CYSTOSCOPY WITH RETROGRADE PYELOGRAM/URETERAL STENT PLACEMENT;  Surgeon: Fredricka Bonine, MD;  Location: [facility];  Service: Urology;  Laterality: Left;  . CYSTOSCOPY WITH RETROGRADE PYELOGRAM, URETEROSCOPY AND STENT PLACEMENT Bilateral 07/15/2018   Procedure: CYSTOSCOPY WITH RETROGRADE PYELOGRAM, URETEROSCOPY;  Surgeon: Irine Seal, MD;  Location: [facility];  Service: Urology;  Laterality: Bilateral;  . CYSTOSCOPY WITH STENT PLACEMENT Left 07/15/2018   Procedure: CYSTOSCOPY WITH STENT PLACEMENT;  Surgeon: Irine Seal, MD;  Location: [facility];  Service: Urology;  Laterality: Left;  . CYSTOSCOPY WITH URETEROSCOPY  02/28/2012   Procedure: CYSTOSCOPY WITH URETEROSCOPY;  Surgeon: Malka So, MD;  Location: [facility];  Service: Urology;  Laterality: Left;  . CYSTOSCOPY/URETEROSCOPY/HOLMIUM LASER/STENT PLACEMENT Right 07/31/2018   Procedure: CYSTOSCOPY STENT REMOVAL  RIGHT URETEROSCOPY WITH HOLMIUM LASER POSSIBLE STENT PLACEMENT;  Surgeon: Irine Seal,  MD;  Location: [facility];  Service: Urology;  Laterality: Right;  . HOLMIUM LASER APPLICATION Left 08/05/7937   Procedure: HOLMIUM LASER APPLICATION;  Surgeon: Malka So, MD;  Location: [facility];  Service: Urology;  Laterality: Left;  . HOLMIUM LASER APPLICATION Right 03/00/9233   Procedure: HOLMIUM LASER APPLICATION;  Surgeon: Irine Seal, MD;  Location: [facility];  Service: Urology;  Laterality: Right;  . INGUINAL LYMPH NODE BIOPSY Left 09/16/2018   Procedure: LEFT INGUINAL LYMPH NODE BIOPSY;  Surgeon: Erroll Luna, MD;  Location: [facility];  Service: General;  Laterality: Left;  . LITHOTRIPSY      X2 w/o benefit  . NEPHROLITHOTOMY  02/28/2012   Procedure: NEPHROLITHOTOMY PERCUTANEOUS;  Surgeon: Malka So, MD;  Location: [facility];  Service: Urology;  Laterality: Left;  . TONSILLECTOMY    . URETEROSCOPY Left 03/31/2013   Procedure: URETEROSCOPY;  Surgeon: Malka So, MD;  Location: [facility];  Service: Urology;  Laterality: Left;  Marland Kitchen VASECTOMY      Family History  Problem Relation Age of Onset  . Heart attack Father 18        CBAG X5 vessels  . Hyperlipidemia Father   . Hypertension Father   . Heart disease Father  before age 56  . Hypertension Brother   . Aortic aneurysm Paternal Uncle        AAA ruptured  . Heart disease Mother   . Hyperlipidemia Mother   . Hypertension Mother   . Diabetes Neg Hx     Medications- reviewed and updated Current Outpatient Medications  Medication Sig Dispense Refill  . amLODipine (NORVASC) 5 MG tablet TAKE 1 TABLET BY MOUTH EVERY DAY (Patient taking differently: Take 5 mg by mouth daily. ) 90 tablet 3  . atorvastatin (LIPITOR) 20 MG tablet Take 1 tablet (20 mg total) by mouth See admin instructions. Take 20 mg by mouth on Monday, Wednesday, Friday and Sunday 51 tablet 3  . HYDROcodone-acetaminophen (NORCO/VICODIN) 5-325 MG tablet Take 1 tablet by mouth every 6 (six) hours as needed for moderate pain. 12 tablet 0   . metoprolol tartrate (LOPRESSOR) 25 MG tablet Take 0.5 tablets (12.5 mg total) by mouth 2 (two) times daily. 180 tablet 3  . tamsulosin (FLOMAX) 0.4 MG CAPS capsule TAKE 1 CAPSULE BY MOUTH EVERYDAY AT BEDTIME 30 capsule 1  . baclofen (LIORESAL) 10 MG tablet Take 1 tablet (10 mg total) by mouth 3 (three) times daily as needed for muscle spasms. 30 each 0   No current facility-administered medications for this visit.     Allergies-reviewed and updated Allergies  Allergen Reactions  . Angiotensin Receptor Blockers     Angioedema with ACE-I  . Ciprofloxacin     Rash on combination of Cipro & Pyridium after cystoscopic & open resection of renal calculi  . Pyridium [Phenazopyridine]     In combo with Cipro    Social History   Socioeconomic History  . Marital status: Married    Spouse name: Not on file  . Number of children: 2  . Years of education: 2  . Highest education level: Not on file  Occupational History  . Occupation: Facilities manager (retired)    Fish farm manager: [employer]  . Financial resource strain: Not on file  . Food insecurity    Worry: Not on file    Inability: Not on file  . Transportation needs    Medical: No    Non-medical: No  Tobacco Use  . Smoking status: Never Smoker  . Smokeless tobacco: Never Used  Substance and Sexual Activity  . Alcohol use: Yes    Comment:  rarely  . Drug use: No  . Sexual activity: Not on file  Lifestyle  . Physical activity    Days per week: Not on file    Minutes per session: Not on file  . Stress: Not on file  Relationships  . Social Herbalist on phone: Not on file    Gets together: Not on file    Attends religious service: Not on file    Active member of club or organization: Not on file    Attends meetings of clubs or organizations: Not on file    Relationship status: Not on file  Other Topics Concern  . Not on file  Social History Narrative   Fun: Work around [employer] and work out.          Objective:  Physical Exam: BP 122/68   Pulse (!) 55   Temp (!) 97.4 F (36.3 C)   Ht '6\' 1"'  (1.854 m)   Wt 185 lb 6.1 oz (84.1 kg)   SpO2 99%   BMI 24.46 kg/m   Body mass index is 24.46 kg/m.  Wt Readings from Last 3 Encounters:  05/11/19 185 lb 6.1 oz (84.1 kg)  04/24/19 185 lb 6.4 oz (84.1 kg)  02/10/19 189 lb 14.4 oz (86.1 kg)   Gen: NAD, resting comfortably HEENT: TMs normal bilaterally. OP clear. No thyromegaly noted.  CV: RRR with no murmurs appreciated Pulm: NWOB, CTAB with no crackles, wheezes, or rhonchi GI: Normal bowel sounds present. Soft, Nontender, Nondistended. MSK: no edema, cyanosis, or clubbing noted Skin: warm, dry Neuro: CN2-12 grossly intact. Strength 5/5 in upper and lower extremities. Reflexes symmetric and intact bilaterally.  Psych: Normal affect and thought content     Caleb M. Jerline Pain, MD 05/11/2019 12:40 PM

## 2019-05-12 NOTE — Progress Notes (Signed)
Please inform patient of the following:  Testosterone level is low - this could explain some of his fatigue. Recommend referral to urology to discuss replacement if he is interested.   His ferritin is in the low range of normal - this could explain some of his restless legs. Recommend starting ferrous sulfate 325mg  every other day for a few weeks and I would like for him to let us know if his symptoms are not improving.   All of his other labs are STABLE. And we can recheck in a year.  Richard [name]. Jerline Pain, MD 05/12/2019 12:33 PM

## 2019-05-25 ENCOUNTER — Other Ambulatory Visit: Payer: Self-pay

## 2019-05-25 MED ORDER — AMLODIPINE BESYLATE 5 MG PO TABS: 5.0000 mg | ORAL_TABLET | Freq: Every day | ORAL | 3 refills | Status: DC

## 2019-08-10 ENCOUNTER — Other Ambulatory Visit: Payer: Self-pay

## 2019-08-10 ENCOUNTER — Ambulatory Visit (HOSPITAL_COMMUNITY)
Admission: RE | Admit: 2019-08-10 | Discharge: 2019-08-10 | Disposition: A | Discharge status: Home / Self Care | Payer: Medicare Other | Source: Ambulatory Visit | Attending: Hematology | Admitting: Hematology

## 2019-08-10 ENCOUNTER — Encounter (HOSPITAL_COMMUNITY): Payer: Self-pay

## 2019-08-10 DIAGNOSIS — N200 Calculus of kidney: Secondary | ICD-10-CM | POA: Diagnosis not present

## 2019-08-10 DIAGNOSIS — C8206 Follicular lymphoma grade I, intrapelvic lymph nodes: Secondary | ICD-10-CM

## 2019-08-10 DIAGNOSIS — C8590 Non-Hodgkin lymphoma, unspecified, unspecified site: Secondary | ICD-10-CM | POA: Diagnosis not present

## 2019-08-10 HISTORY — DX: Follicular lymphoma, unspecified, unspecified site: C82.90

## 2019-08-12 NOTE — Progress Notes (Signed)
HEMATOLOGY/ONCOLOGY CLINIC NOTE  Date of Service: 08/13/2019  Patient Care Team: Vivi Barrack, MD as PCP - General (Family Medicine) Brunetta Genera, MD as Consulting Physician (Hematology) Irine Seal, MD as Attending Physician (Urology) Dyke Maes, OD as Consulting Physician (Optometry)  CHIEF COMPLAINTS/PURPOSE OF CONSULTATION:  F/u for follicular lymphoma  HISTORY OF PRESENTING ILLNESS:   Richard [name] is a wonderful 74 y.o. male who has been referred to [place] by Dr. Brantley Stage for evaluation and management of low grade follicular lymphoma. He is being accompanied by his wife, Richard [name] today. He notes that he first noticed a lump to his left inner thigh during a shower approximately 3-4 years ago and noted that it increased in size over the past couple of months.   He had an US of the LLE on 07/16/2018 showed: Soft tissue masses in the left inguinal region most compatible with enlarged lymph nodes/adenopathy, the largest measuring up to 6.1 cm.  On 08/20/2018, the patient underwent cytometry that showed: Tissue-Flow Cytometry with monoclonal B-cell population identified.   Lymph node biopsy on 08/20/2018 showed: Lymph node, needle/core biopsy, Left Inguinal with atypical lymphoid proliferation.   On 09/16/2018, the patient had biopsy completed with pathology showing: Lymph node for lymphoma, left inguinal with low grade follicular lymphoma.   PMHx, he has a hx of squamous cell carcinoma to bilateral sides of his face, none that is active.   SHx, he has had several cystoscopies within the past 4 months and his urologist is   On review of systems, he reports additional left inguinal lump x 4-5 months, intentional weight loss, recurrent kidney stones (most recently December 2019). he denies fever, chills, night sweats, no other areas of concerns, leg swelling, abdominal pain, flank pain, testicular pain/swelling, rashes, and any other symptoms. He hasn't had his prior stones  analyzed to determine the cause of them. He has intentionally lost 45 lbs in the past couple of weeks through keto diet and intermittent fasting. He doesn't consume bread or pasta. He works out with [employer] about every other day. His PCP is Vivi Barrack, MD and his Urologist is Dr. Irine Seal. Pertinent positives are listed and detailed within the above HPI.   Interval History:   Richard [name] returns today for management and evaluation of his Follicular Lymphoma.The patient's last visit with [place] was on 12/08/2018. The pt reports that he is doing well overall.  The pt reports no new concerns.  He is now taking iron supplements.  He works out 2 to 3 times a week.  Of note since the patient's last visit, pt has had CT Abdomen Pelvis Wo Contrast (Accession [id number]) completed on 08/10/2019 with results revealing "1. Interval response to therapy as evidenced by decrease in size of left external iliac and left inguinal adenopathy. 2. Bilateral renal stones. 3. Aortic atherosclerosis (ICD10-I70.0). 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 PENDING LDH.  On review of systems, pt reports no new concerns and denies lumps or bumps, rashes, fevers, chills, night sweats, new infections, abdominal pain, enlarged lymph nodes, leg swelling and any other symptoms.   MEDICAL HISTORY:  Past Medical History:  Diagnosis Date  . Aneurysm ([place])    left common iliac-stable  . Arthritis   .  Fasting hyperglycemia   . Follicular lymphoma ([place]) dx'd 07/2018  . History of kidney stones   . Hyperlipidemia    LDL goal = < 100 based on NMR Lipoproprofile  . Hypertension   . Mass of left lower extremity    inner thigh  . Nephrolithiasis    left   . Renal insufficiency   . Sepsis ([place]) 03/24/2013  .  Squamous cell carcinoma, face    Dr Sarajane Jews  . Ureteral stone with hydronephrosis 03/24/2013   left    SURGICAL HISTORY: Past Surgical History:  Procedure Laterality Date  . CATARACT EXTRACTION  2005   OD   . COLONOSCOPY W/ POLYPECTOMY  2007   adenoma X2 ; [facility]  . CYSTOSCOPY W/ URETERAL STENT PLACEMENT Left 03/23/2013   Procedure: CYSTOSCOPY WITH RETROGRADE PYELOGRAM/URETERAL STENT PLACEMENT;  Surgeon: Fredricka Bonine, MD;  Location: [facility];  Service: Urology;  Laterality: Left;  . CYSTOSCOPY WITH RETROGRADE PYELOGRAM, URETEROSCOPY AND STENT PLACEMENT Bilateral 07/15/2018   Procedure: CYSTOSCOPY WITH RETROGRADE PYELOGRAM, URETEROSCOPY;  Surgeon: Irine Seal, MD;  Location: [facility];  Service: Urology;  Laterality: Bilateral;  . CYSTOSCOPY WITH STENT PLACEMENT Left 07/15/2018   Procedure: CYSTOSCOPY WITH STENT PLACEMENT;  Surgeon: Irine Seal, MD;  Location: [facility];  Service: Urology;  Laterality: Left;  . CYSTOSCOPY WITH URETEROSCOPY  02/28/2012   Procedure: CYSTOSCOPY WITH URETEROSCOPY;  Surgeon: Malka So, MD;  Location: [facility];  Service: Urology;  Laterality: Left;  . CYSTOSCOPY/URETEROSCOPY/HOLMIUM LASER/STENT PLACEMENT Right 07/31/2018   Procedure: CYSTOSCOPY STENT REMOVAL  RIGHT URETEROSCOPY WITH HOLMIUM LASER POSSIBLE STENT PLACEMENT;  Surgeon: Irine Seal, MD;  Location: [facility];  Service: Urology;  Laterality: Right;  . HOLMIUM LASER APPLICATION Left [id number]   Procedure: HOLMIUM LASER APPLICATION;  Surgeon: Malka So, MD;  Location: [facility];  Service: Urology;  Laterality: Left;  . HOLMIUM LASER APPLICATION Right [id number]   Procedure: HOLMIUM LASER APPLICATION;  Surgeon: Irine Seal, MD;  Location: [facility];  Service: Urology;  Laterality: Right;  . INGUINAL LYMPH NODE BIOPSY Left 09/16/2018   Procedure: LEFT INGUINAL LYMPH NODE BIOPSY;  Surgeon: Erroll Luna, MD;  Location: [facility];  Service: General;  Laterality: Left;  .  LITHOTRIPSY      X2 w/o benefit  . NEPHROLITHOTOMY  02/28/2012   Procedure: NEPHROLITHOTOMY PERCUTANEOUS;  Surgeon: Malka So, MD;  Location: [facility];  Service: Urology;  Laterality: Left;  . TONSILLECTOMY    . URETEROSCOPY Left 03/31/2013   Procedure: URETEROSCOPY;  Surgeon: Malka So, MD;  Location: [facility];  Service: Urology;  Laterality: Left;  Marland Kitchen VASECTOMY      SOCIAL HISTORY: Social History   Socioeconomic History  . Marital status: Married    Spouse name: Not on file  . Number of children: 2  . Years of education: 9  . Highest education level: Not on file  Occupational History  . Occupation: Facilities manager (retired)    Fish farm manager: [employer]  Tobacco Use  . Smoking status: Never Smoker  . Smokeless tobacco: Never Used  Substance and Sexual Activity  . Alcohol use: Yes    Comment:  rarely  . Drug use: No  . Sexual activity: Not on file  Other Topics Concern  . Not on file  Social History Narrative   Fun: Work around [employer] and work out.    Social Determinants of Health   Financial Resource Strain:   . Difficulty of Paying Living Expenses: Not  on file  Food Insecurity:   . Worried About Charity fundraiser in the Last Year: Not on file  . Ran Out of Food in the Last Year: Not on file  Transportation Needs: No Transportation Needs  . Lack of Transportation (Medical): No  . Lack of Transportation (Non-Medical): No  Physical Activity:   . Days of Exercise per Week: Not on file  . Minutes of Exercise per Session: Not on file  Stress:   . Feeling of Stress : Not on file  Social Connections:   . Frequency of Communication with Friends and Family: Not on file  . Frequency of Social Gatherings with Friends and Family: Not on file  . Attends Religious Services: Not on file  . Active Member of Clubs or Organizations: Not on file  . Attends Archivist Meetings: Not on file  . Marital Status: Not on file  Intimate Partner Violence: Not At Risk  .  Fear of Current or Ex-Partner: No  . Emotionally Abused: No  . Physically Abused: No  . Sexually Abused: No    FAMILY HISTORY: Family History  Problem Relation Age of Onset  . Heart attack Father 36        CBAG X5 vessels  . Hyperlipidemia Father   . Hypertension Father   . Heart disease Father        before age 21  . Hypertension Brother   . Aortic aneurysm Paternal Uncle        AAA ruptured  . Heart disease Mother   . Hyperlipidemia Mother   . Hypertension Mother   . Diabetes Neg Hx     ALLERGIES:  is allergic to angiotensin receptor blockers; ciprofloxacin; and pyridium [phenazopyridine].  MEDICATIONS:  Current Outpatient Medications  Medication Sig Dispense Refill  . amLODipine (NORVASC) 5 MG tablet Take 1 tablet (5 mg total) by mouth daily. 90 tablet 3  . atorvastatin (LIPITOR) 20 MG tablet Take 1 tablet (20 mg total) by mouth See admin instructions. Take 20 mg by mouth on Monday, Wednesday, Friday and Sunday 51 tablet 3  . baclofen (LIORESAL) 10 MG tablet Take 1 tablet (10 mg total) by mouth 3 (three) times daily as needed for muscle spasms. 30 each 0  . HYDROcodone-acetaminophen (NORCO/VICODIN) 5-325 MG tablet Take 1 tablet by mouth every 6 (six) hours as needed for moderate pain. 12 tablet 0  . metoprolol tartrate (LOPRESSOR) 25 MG tablet Take 0.5 tablets (12.5 mg total) by mouth 2 (two) times daily. 180 tablet 3  . tamsulosin (FLOMAX) 0.4 MG CAPS capsule TAKE 1 CAPSULE BY MOUTH EVERYDAY AT BEDTIME 30 capsule 1   No current facility-administered medications for this visit.    REVIEW OF SYSTEMS:   A 10+ POINT REVIEW OF SYSTEMS WAS OBTAINED including neurology, dermatology, psychiatry, cardiac, respiratory, lymph, extremities, GI, GU, Musculoskeletal, constitutional, breasts, reproductive, HEENT.  All pertinent positives are noted in the HPI.  All others are negative.     PHYSICAL EXAMINATION: ECOG FS:1 - Symptomatic but completely ambulatory  Vitals:   08/13/19  1342  BP: 137/83  Pulse: 65  Resp: 18  Temp: 97.8 F (36.6 C)  SpO2: 100%   Wt Readings from Last 3 Encounters:  08/13/19 184 lb 12.8 oz (83.8 kg)  05/11/19 185 lb 6.1 oz (84.1 kg)  04/24/19 185 lb 6.4 oz (84.1 kg)   Body mass index is 24.38 kg/m.    GENERAL:alert, in no acute distress and comfortable SKIN: no acute rashes, no significant  lesions EYES: conjunctiva are pink and non-injected, sclera anicteric OROPHARYNX: MMM, no exudates, no oropharyngeal erythema or ulceration NECK: supple, no JVD LYMPH:  no palpable lymphadenopathy in the cervical, axillary or inguinal regions LUNGS: clear to auscultation b/l with normal respiratory effort HEART: regular rate & rhythm ABDOMEN:  normoactive bowel sounds , non tender, not distended. Extremity: no pedal edema PSYCH: alert & oriented x 3 with fluent speech NEURO: no focal motor/sensory deficits    LABORATORY DATA:  I have reviewed the data as listed  . CBC Latest Ref Rng & Units 08/13/2019 05/11/2019 02/10/2019  WBC 4.0 - 10.5 K/uL 5.6 4.5 4.4  Hemoglobin 13.0 - 17.0 g/dL 14.4 14.1 14.0  Hematocrit 39.0 - 52.0 % 44.2 43.0 43.8  Platelets 150 - 400 K/uL 156 154.0 150    . CMP Latest Ref Rng & Units 08/13/2019 05/11/2019 02/10/2019  Glucose 70 - 99 mg/dL 107(H) 97 100(H)  BUN 8 - 23 mg/dL 36(H) 34(H) 40(H)  Creatinine 0.61 - 1.24 mg/dL 1.44(H) 1.60(H) 1.54(H)  Sodium 135 - 145 mmol/L 142 140 142  Potassium 3.5 - 5.1 mmol/L 5.1 4.5 4.6  Chloride 98 - 111 mmol/L 104 102 106  CO2 22 - 32 mmol/L 28 31 26   Calcium 8.9 - 10.3 mg/dL 9.2 9.8 8.9  Total Protein 6.5 - 8.1 g/dL 6.7 6.3 6.5  Total Bilirubin 0.3 - 1.2 mg/dL 0.7 0.9 0.7  Alkaline Phos 38 - 126 U/L 69 66 58  AST 15 - 41 U/L 21 17 21   ALT 0 - 44 U/L 18 16 18   08/10/2019 CT Abdomen Pelvis Wo Contrast (Accession [id number])      RADIOGRAPHIC STUDIES: I have personally reviewed the radiological images as listed and agreed with the findings in the report. CT Abdomen  Pelvis Wo Contrast  Result Date: 08/10/2019 CLINICAL DATA:  Lymphoma.  Assess treatment response. EXAM: CT ABDOMEN AND PELVIS WITHOUT CONTRAST TECHNIQUE: Multidetector CT imaging of the abdomen and pelvis was performed following the standard protocol without IV contrast. COMPARISON:  PET 10/08/2018 and CT abdomen pelvis 11/20/2017. FINDINGS: Lower chest: Calcified granuloma in the right middle lobe. Lung bases are otherwise clear. Heart size normal. Coronary artery calcification. No pericardial or pleural effusion. Hepatobiliary: Liver and gallbladder are unremarkable. No biliary ductal dilatation. Pancreas: Negative. Spleen: Negative. Adrenals/Urinary Tract: Adrenal glands are unremarkable. Stones are seen in the kidneys bilaterally. Right ureter is decompressed. Mild left pelvocaliectasis, similar. Left ureter is decompressed. Bladder is grossly unremarkable. Stomach/Bowel: Stomach, small bowel, appendix and colon are unremarkable. Vascular/Lymphatic: Atherosclerotic calcification of the aorta without abdominal aortic aneurysm. Celiac trunk aneurysm measures 1.9 cm, as before. There is a large aneurysm of the left common iliac artery, measuring 3.2 cm. No pathologically enlarged lymph nodes within the abdomen or pelvis. Previously enlarged left external iliac lymph node now measures 6 mm (2/80), previously 1.8 cm. Inguinal lymph nodes on the left have also decreased in size in the interval. Largest residual left inguinal lymph node measures 7 mm (2/86), previously 1.9 cm. There is a partially imaged area of somewhat ill-defined heterogeneous soft tissue density inferior to surgical clips in the left groin (2/98), likely reduced in size from 10/08/2018. Reproductive: Prostate is visualized. Other: No free fluid.  Mesenteries and peritoneum are unremarkable. Musculoskeletal: No worrisome lytic or sclerotic lesions. Prominent Schmorl's node along the inferior endplate of 624THL. IMPRESSION: 1. Interval response to  therapy as evidenced by decrease in size of left external iliac and left inguinal adenopathy. 2. Bilateral renal stones. 3.  Aortic atherosclerosis (ICD10-I70.0). 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 Electronically Signed   By: Lorin Picket M.D.   On: 08/10/2019 11:00    ASSESSMENT & PLAN:   1. Low-grade follicular lymphoma- Stage I to II -First noticed a left inner thigh lump approximately 3-4 years ago and noted that it increased in size over the past couple of months.  -He had an US of the LLE on 07/16/2018 showed: Soft tissue masses in the left inguinal region most compatible with enlarged lymph nodes/adenopathy, the largest measuring up to 6.1 cm. 08/20/2018 Tissue-Flow Cytometry with monoclonal B-cell population identified.  08/20/2018 LN Biopsy revealed Left Inguinal with atypical lymphoid proliferation.  09/16/2018: Lymph node biopsy, left inguinal with low grade follicular lymphoma.   09/29/18 Hep B and Hep C negative 10/08/18 PET/CT revealed Left inguinal and external iliac hypermetabolic adenopathy, consistent with active lymphoma. ([place] [facility]). 2. No extrapelvic disease identified. 3. Coronary artery atherosclerosis. Aortic Atherosclerosis. 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  PLAN:  -Discussed pt labwork today, 08/13/19;  all values are WNL except for Glucose Bld at 107, BUN at 36, Creatinine at 1.44, GFR Est Non Af Am at 49, GFR Est AFR AM at 56. PENDING LDH. -Discussed 08/13/19 potassium at 5.1 -Recommended that he limit foods high in potassium. -Discussed CT Abdomen Pelvis Wo Contrast (Accession [id number]) completed on 08/10/2019 with results revealing "1. Interval response to therapy as evidenced by decrease in size of left external iliac and left inguinal adenopathy. 2. Bilateral renal stones. 3. Aortic atherosclerosis (ICD10-I70.0). 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  -Advised to speak with PCP options to take to increase health before considering testosterone replacement.  -Discussed the risks with taking Advil -Provided handout about COVID-19 vaccinations  FOLLOW UP: RTC with Dr Irene Limbo with labs in 6 months   The total time spent in the appt was 20 minutes and more than 50% was on counseling and direct patient cares.  All of the patient's questions were answered with apparent satisfaction. The patient knows to call the clinic with any problems, questions or concerns.     Sullivan Lone MD MS AAHIVMS [facility] Hematology/Oncology Physician [facility]  (Office):       [phone] (Work cell):  [phone] (Fax):           [phone]  08/12/2019 7:47 PM  I, Scot Dock, am acting as a scribe for Dr. Sullivan Lone.   .I have reviewed the above documentation for accuracy and completeness, and I agree with the above. Brunetta Genera MD

## 2019-08-13 ENCOUNTER — Telehealth: Payer: Self-pay | Admitting: Hematology

## 2019-08-13 ENCOUNTER — Inpatient Hospital Stay: Payer: Medicare Other | Attending: Hematology

## 2019-08-13 ENCOUNTER — Other Ambulatory Visit: Payer: Self-pay

## 2019-08-13 ENCOUNTER — Inpatient Hospital Stay (HOSPITAL_BASED_OUTPATIENT_CLINIC_OR_DEPARTMENT_OTHER): Payer: Medicare Other | Admitting: Hematology

## 2019-08-13 VITALS — BP 137/83 | HR 65 | Temp 97.8°F | Resp 18 | Ht 73.0 in | Wt 184.8 lb

## 2019-08-13 DIAGNOSIS — C8295 Follicular lymphoma, unspecified, lymph nodes of inguinal region and lower limb: Secondary | ICD-10-CM | POA: Insufficient documentation

## 2019-08-13 DIAGNOSIS — C8206 Follicular lymphoma grade I, intrapelvic lymph nodes: Secondary | ICD-10-CM

## 2019-08-13 LAB — CBC WITH DIFFERENTIAL/PLATELET
Abs Immature Granulocytes: 0.02 10*3/uL (ref 0.00–0.07)
Basophils Absolute: 0 10*3/uL (ref 0.0–0.1)
Basophils Relative: 0 %
Eosinophils Absolute: 0.1 10*3/uL (ref 0.0–0.5)
Eosinophils Relative: 2 %
HCT: 44.2 % (ref 39.0–52.0)
Hemoglobin: 14.4 g/dL (ref 13.0–17.0)
Immature Granulocytes: 0 %
Lymphocytes Relative: 16 %
Lymphs Abs: 0.9 10*3/uL (ref 0.7–4.0)
MCH: 27.7 pg (ref 26.0–34.0)
MCHC: 32.6 g/dL (ref 30.0–36.0)
MCV: 85 fL (ref 80.0–100.0)
Monocytes Absolute: 0.4 10*3/uL (ref 0.1–1.0)
Monocytes Relative: 8 %
Neutro Abs: 4.1 10*3/uL (ref 1.7–7.7)
Neutrophils Relative %: 74 %
Platelets: 156 10*3/uL (ref 150–400)
RBC: 5.2 MIL/uL (ref 4.22–5.81)
RDW: 14.6 % (ref 11.5–15.5)
WBC: 5.6 10*3/uL (ref 4.0–10.5)
nRBC: 0 % (ref 0.0–0.2)

## 2019-08-13 LAB — CMP (CANCER CENTER ONLY)
ALT: 18 U/L (ref 0–44)
AST: 21 U/L (ref 15–41)
Albumin: 4.4 g/dL (ref 3.5–5.0)
Alkaline Phosphatase: 69 U/L (ref 38–126)
Anion gap: 10 (ref 5–15)
BUN: 36 mg/dL — ABNORMAL HIGH (ref 8–23)
CO2: 28 mmol/L (ref 22–32)
Calcium: 9.2 mg/dL (ref 8.9–10.3)
Chloride: 104 mmol/L (ref 98–111)
Creatinine: 1.44 mg/dL — ABNORMAL HIGH (ref 0.61–1.24)
GFR, Est AFR Am: 56 mL/min — ABNORMAL LOW (ref >60)
GFR, Estimated: 49 mL/min — ABNORMAL LOW (ref >60)
Glucose, Bld: 107 mg/dL — ABNORMAL HIGH (ref 70–99)
Potassium: 5.1 mmol/L (ref 3.5–5.1)
Sodium: 142 mmol/L (ref 135–145)
Total Bilirubin: 0.7 mg/dL (ref 0.3–1.2)
Total Protein: 6.7 g/dL (ref 6.5–8.1)

## 2019-08-13 LAB — LACTATE DEHYDROGENASE: LDH: 148 U/L (ref 98–192)

## 2019-08-13 NOTE — Telephone Encounter (Signed)
Scheduled appt per 1/14 los.  Sent a message to HIM pool to get a calendar mailed out. 

## 2019-10-07 DIAGNOSIS — H5203 Hypermetropia, bilateral: Secondary | ICD-10-CM | POA: Diagnosis not present

## 2019-11-02 ENCOUNTER — Other Ambulatory Visit: Payer: Self-pay | Admitting: Family Medicine

## 2019-11-21 IMAGING — US US GUIDANCE NEEDLE PLACEMENT
1 series · 8 of 8 positions shown · non-contrast
Comparison: none

INDICATION: Enlarged left inguinal lymph node

[Series 1: us guidance needle placement · 8 of 8 slices shown]
[im 1/8]
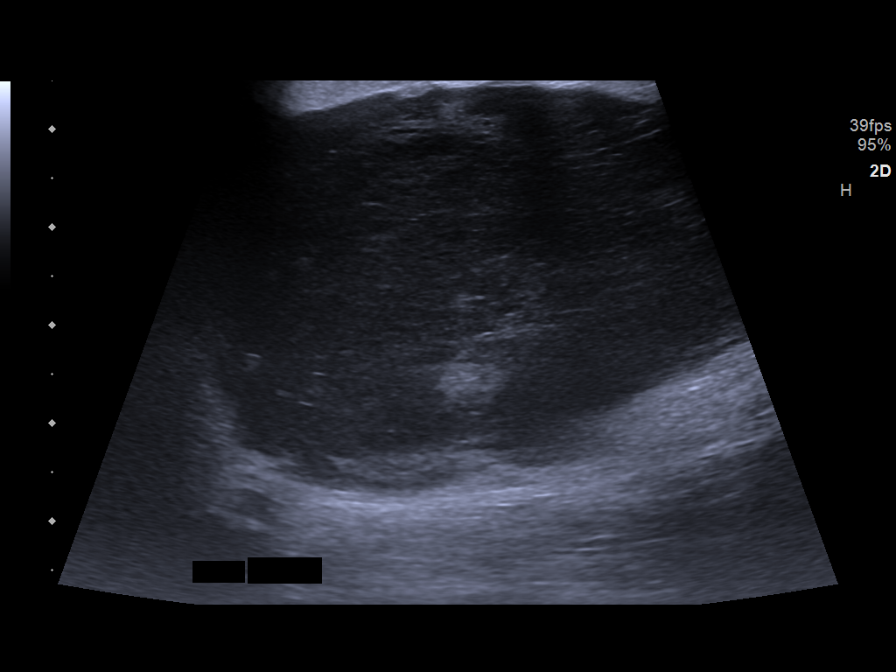
[im 2/8]
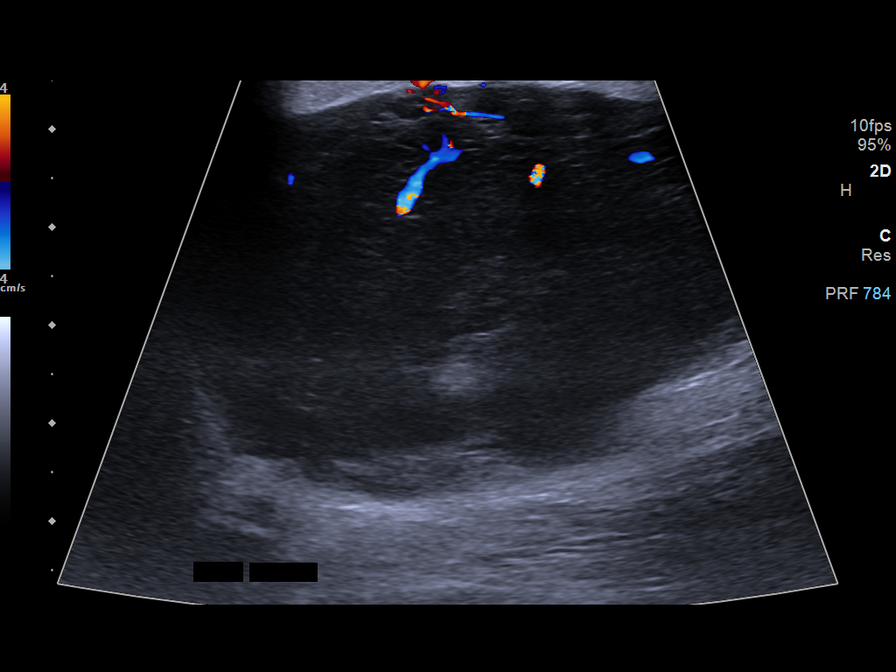
[im 3/8]
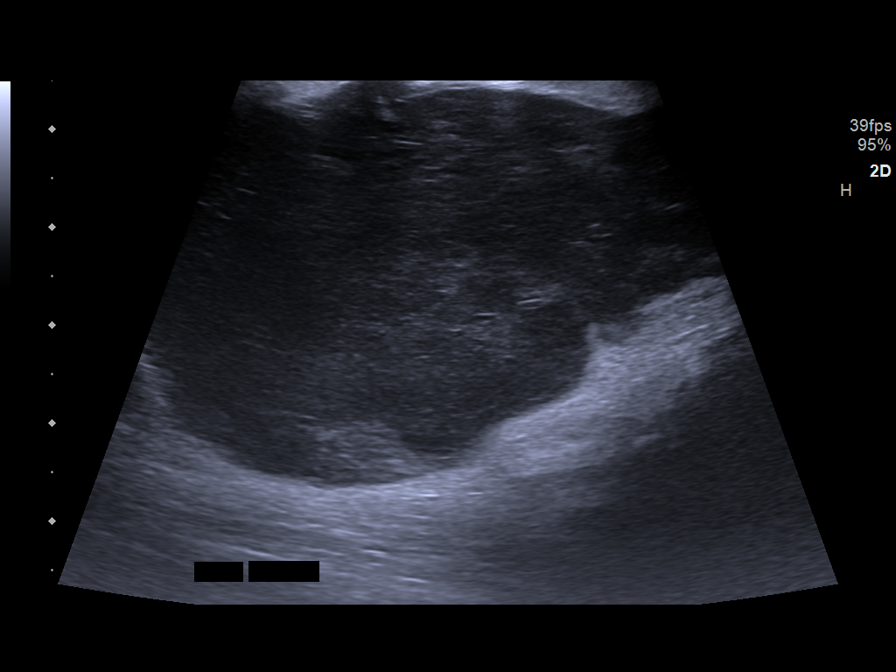
[im 4/8]
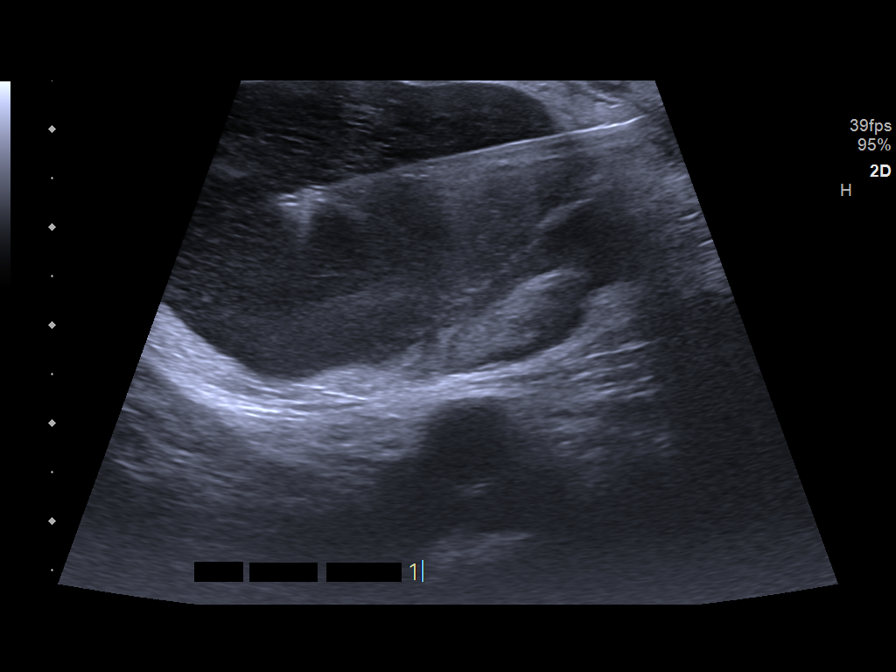
[im 5/8]
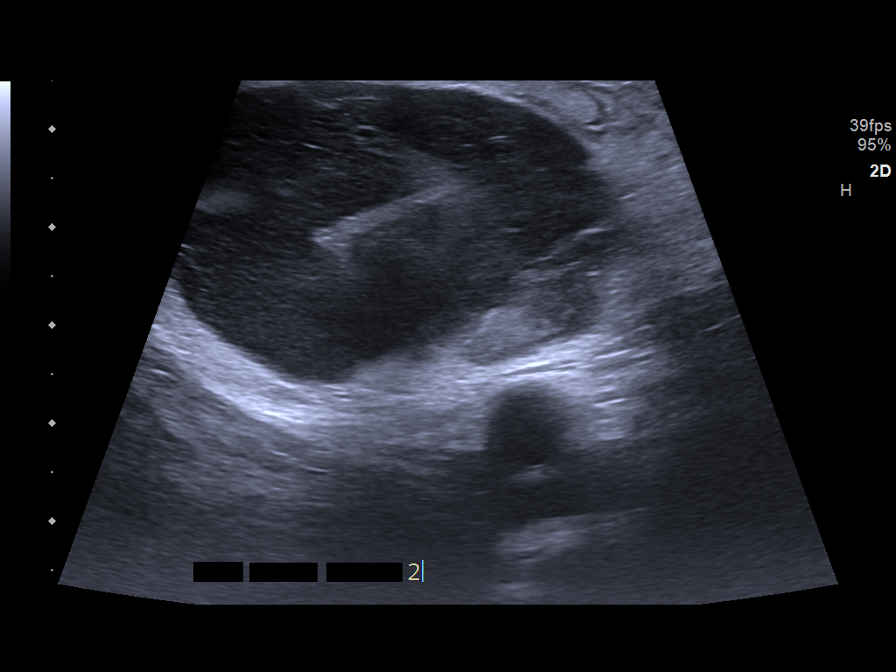
[im 6/8]
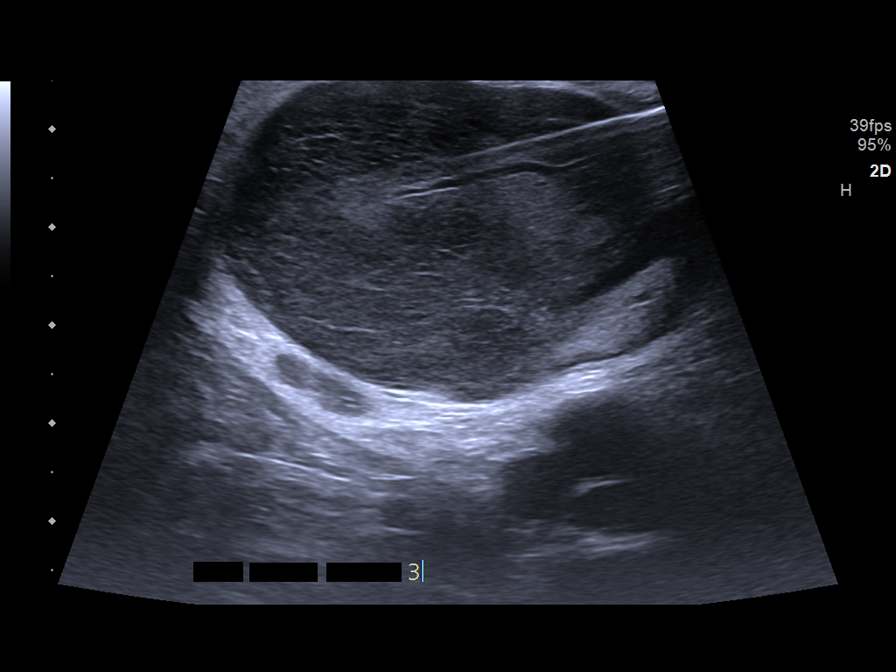
[im 7/8]
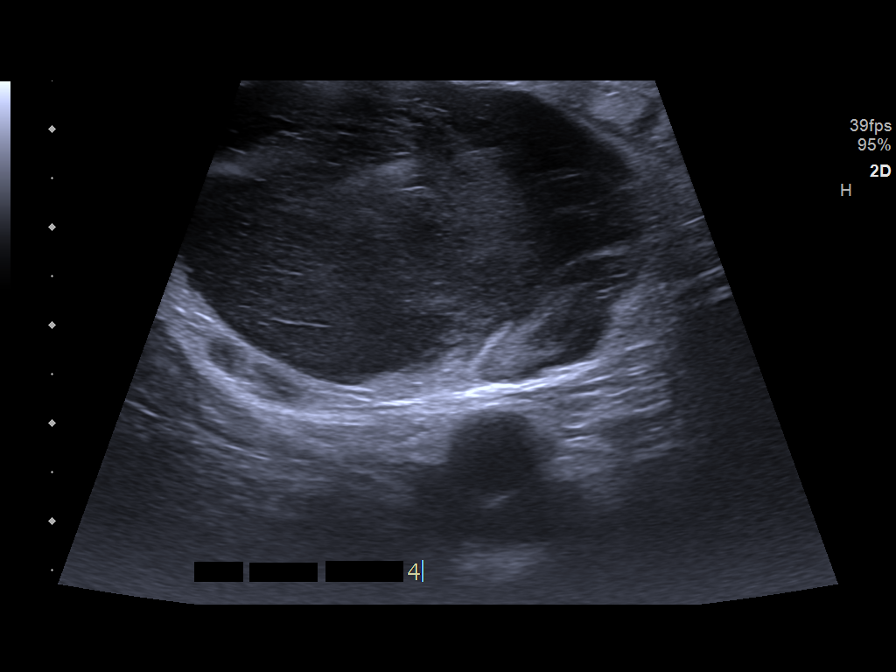
[im 8/8]
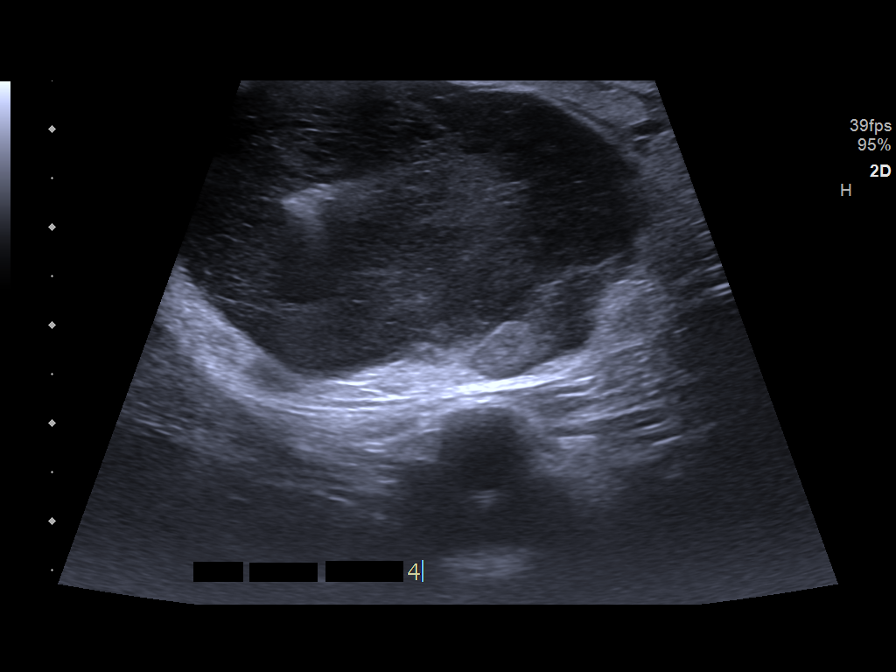

[8 of 8 positions shown; findings below may reference images not displayed]

EXAM:
ULTRASOUND-GUIDED CORE BIOPSY OF AN ENLARGED LEFT INGUINAL LYMPH
NODE

MEDICATIONS:
None.

ANESTHESIA/SEDATION:
None

FLUOROSCOPY TIME:  None

COMPLICATIONS:
None immediate.

PROCEDURE:
Informed written consent was obtained from the patient after a
thorough discussion of the procedural risks, benefits and
alternatives. All questions were addressed. Maximal Sterile Barrier
Technique was utilized including caps, mask, sterile gowns, sterile
gloves, sterile drape, hand hygiene and skin antiseptic. A timeout
was performed prior to the initiation of the procedure.

The left groin was prepped with ChloraPrep in a sterile fashion, and
a sterile drape was applied covering the operative field. A sterile
gown and sterile gloves were used for the procedure.

Under sonographic guidance, 4 18 gauge core biopsies of the enlarged
left inguinal lymph node were obtained. Final imaging was performed.
FINDINGS: The images document guide needle placement within the enlarged left
inguinal lymph node. Post biopsy images demonstrate no hemorrhage.
IMPRESSION: Successful ultrasound-guided core biopsy of an enlarged left
inguinal lymph node.

## 2020-01-09 IMAGING — PT NUCLEAR MEDICINE PET IMAGE INITIAL (PI) SKULL BASE TO THIGH
1 of 8 series · 3 of 16 positions shown, 4 images · non-contrast
Comparison: CTs of the abdomen and pelvis including 07/14/2018.

CLINICAL DATA: Initial treatment strategy for staging of follicular
lymphoma of inguinal region.

EXAM:
NUCLEAR MEDICINE PET SKULL BASE TO THIGH
TECHNIQUE: 10.3 mCi F-18 FDG was injected intravenously. Full-ring PET imaging
was performed from the skull base to thigh after the radiotracer. CT
data was obtained and used for attenuation correction and anatomic
localization.
Fasting blood glucose: 89 mg/dl

[Series 4: ct sk_thigh 5.0 b31f · axial · 0.98mm/px · z∈[-828,+132]mm · 3 of 241 slices shown, 4 images]
[im 1/241  soft-tissue]
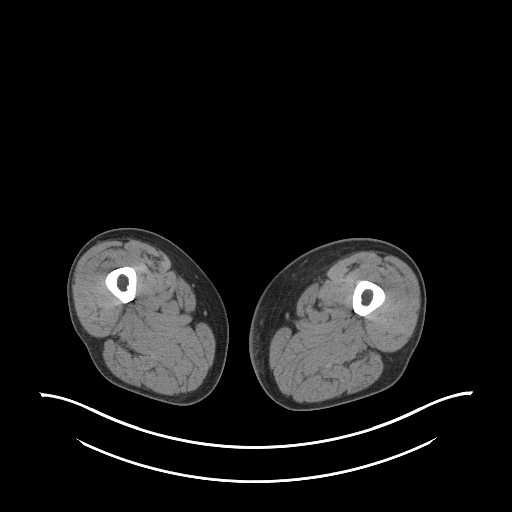
[im 1/241  bone]
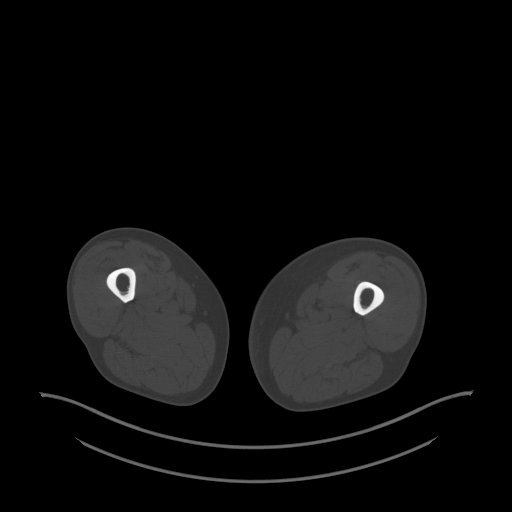
[im 121/241  soft-tissue]
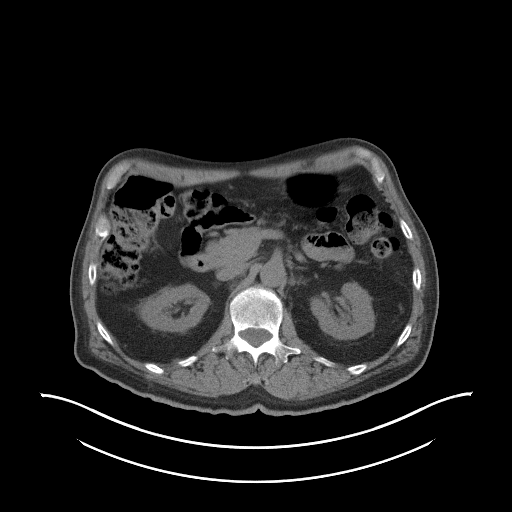
[im 241/241  soft-tissue]
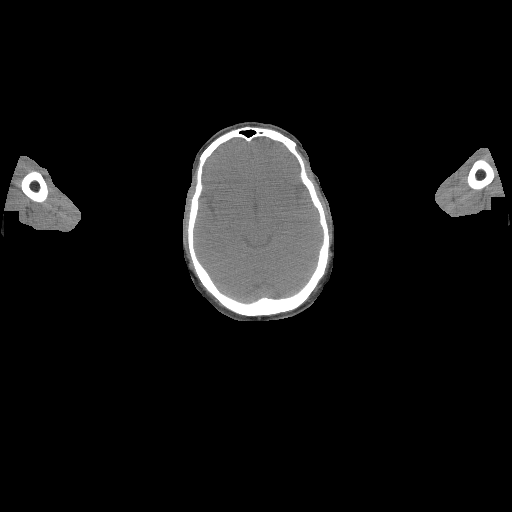

[3 of 16 positions shown; findings below may reference images not displayed]

FINDINGS: Mediastinal blood pool activity: SUV max

Liver activity:  S.U.V. max

NECK: No areas of abnormal hypermetabolism.

Incidental CT findings: No cervical adenopathy.

CHEST: No pulmonary parenchymal or thoracic nodal hypermetabolism.

Incidental CT findings: Tortuous thoracic aorta. Aortic
atherosclerosis. Mild cardiomegaly. Lad coronary artery
calcification. Mediastinal nodes are small and calcified likely
related to old granulomatous disease. Right middle lobe calcified
granuloma.

ABDOMEN/PELVIS: No abdominal nodal or parenchymal hypermetabolism.

Left external iliac node measures 1.9 cm and a S.U.V. max of 5.4 on
image 185/4.

Multiple left inguinal nodes have enlarged from 07/14/2018. An index
node measures 1.4 cm and a S.U.V. max of 6.5 on image 190/4. This
node measures 1.1 cm on the prior diagnostic CT. Heterogeneous
hypermetabolism at the site of the previous dominant left inguinal
node. This could be postoperative or represent residual nodal tissue
in this area.

Incidental CT findings: Normal adrenal glands. Bilateral renal
collecting system calculi, maximally 7 mm on the left. Mild
prostatomegaly. Suggestion of bladder wall thickening which may
represent a component of outlet obstruction.

Celiac ectasia at 1.9 cm is not significantly changed. No abdominal
adenopathy.

Left common iliac artery aneurysm of up to 3.2 cm is not
significantly changed.

SKELETON: No abnormal marrow activity.

Incidental CT findings: none
IMPRESSION: 1. Left inguinal and external iliac hypermetabolic adenopathy,
consistent with active lymphoma. ([[facility]] 4).
2. No extrapelvic disease identified.
3. Coronary artery atherosclerosis. Aortic Atherosclerosis
([id number].A). Similar ectasia of the celiac axis and aneurysm of
the left common iliac artery.

## 2020-02-10 ENCOUNTER — Other Ambulatory Visit: Payer: Self-pay

## 2020-02-10 ENCOUNTER — Inpatient Hospital Stay: Payer: Medicare Other | Attending: Hematology | Admitting: Hematology

## 2020-02-10 ENCOUNTER — Inpatient Hospital Stay: Payer: Medicare Other

## 2020-02-10 VITALS — BP 130/83 | HR 66 | Temp 97.9°F | Resp 18 | Ht 73.0 in | Wt 191.0 lb

## 2020-02-10 DIAGNOSIS — C8206 Follicular lymphoma grade I, intrapelvic lymph nodes: Secondary | ICD-10-CM

## 2020-02-10 DIAGNOSIS — C8295 Follicular lymphoma, unspecified, lymph nodes of inguinal region and lower limb: Secondary | ICD-10-CM | POA: Diagnosis not present

## 2020-02-10 DIAGNOSIS — C8205 Follicular lymphoma grade I, lymph nodes of inguinal region and lower limb: Secondary | ICD-10-CM | POA: Diagnosis not present

## 2020-02-10 LAB — CMP (CANCER CENTER ONLY)
ALT: 24 U/L (ref 0–44)
AST: 25 U/L (ref 15–41)
Albumin: 4.1 g/dL (ref 3.5–5.0)
Alkaline Phosphatase: 72 U/L (ref 38–126)
Anion gap: 9 (ref 5–15)
BUN: 35 mg/dL — ABNORMAL HIGH (ref 8–23)
CO2: 27 mmol/L (ref 22–32)
Calcium: 9.7 mg/dL (ref 8.9–10.3)
Chloride: 105 mmol/L (ref 98–111)
Creatinine: 1.47 mg/dL — ABNORMAL HIGH (ref 0.61–1.24)
GFR, Est AFR Am: 54 mL/min — ABNORMAL LOW (ref >60)
GFR, Estimated: 47 mL/min — ABNORMAL LOW (ref >60)
Glucose, Bld: 100 mg/dL — ABNORMAL HIGH (ref 70–99)
Potassium: 4.9 mmol/L (ref 3.5–5.1)
Sodium: 141 mmol/L (ref 135–145)
Total Bilirubin: 0.8 mg/dL (ref 0.3–1.2)
Total Protein: 6.5 g/dL (ref 6.5–8.1)

## 2020-02-10 LAB — CBC WITH DIFFERENTIAL/PLATELET
Abs Immature Granulocytes: 0.02 10*3/uL (ref 0.00–0.07)
Basophils Absolute: 0 10*3/uL (ref 0.0–0.1)
Basophils Relative: 1 %
Eosinophils Absolute: 0.1 10*3/uL (ref 0.0–0.5)
Eosinophils Relative: 1 %
HCT: 43.8 % (ref 39.0–52.0)
Hemoglobin: 14.1 g/dL (ref 13.0–17.0)
Immature Granulocytes: 0 %
Lymphocytes Relative: 14 %
Lymphs Abs: 0.8 10*3/uL (ref 0.7–4.0)
MCH: 27.5 pg (ref 26.0–34.0)
MCHC: 32.2 g/dL (ref 30.0–36.0)
MCV: 85.4 fL (ref 80.0–100.0)
Monocytes Absolute: 0.6 10*3/uL (ref 0.1–1.0)
Monocytes Relative: 9 %
Neutro Abs: 4.7 10*3/uL (ref 1.7–7.7)
Neutrophils Relative %: 75 %
Platelets: 167 10*3/uL (ref 150–400)
RBC: 5.13 MIL/uL (ref 4.22–5.81)
RDW: 15.1 % (ref 11.5–15.5)
WBC: 6.2 10*3/uL (ref 4.0–10.5)
nRBC: 0 % (ref 0.0–0.2)

## 2020-02-10 LAB — LACTATE DEHYDROGENASE: LDH: 165 U/L (ref 98–192)

## 2020-02-10 NOTE — Progress Notes (Signed)
HEMATOLOGY/ONCOLOGY CLINIC NOTE  Date of Service: 02/10/2020  Patient Care Team: Vivi Barrack, MD as PCP - General (Family Medicine) Brunetta Genera, MD as Consulting Physician (Hematology) Irine Seal, MD as Attending Physician (Urology) Dyke Maes, OD as Consulting Physician (Optometry)  CHIEF COMPLAINTS/PURPOSE OF CONSULTATION:  F/u for follicular lymphoma  HISTORY OF PRESENTING ILLNESS:   Richard [name] is a wonderful 74 y.o. male who has been referred to [place] by Dr. Brantley Stage for evaluation and management of low grade follicular lymphoma. He is being accompanied by his wife, Velva Harman today. He notes that he first noticed a lump to his left inner thigh during a shower approximately 3-4 years ago and noted that it increased in size over the past couple of months.   He had an US of the LLE on 07/16/2018 showed: Soft tissue masses in the left inguinal region most compatible with enlarged lymph nodes/adenopathy, the largest measuring up to 6.1 cm.  On 08/20/2018, the patient underwent cytometry that showed: Tissue-Flow Cytometry with monoclonal B-cell population identified.   Lymph node biopsy on 08/20/2018 showed: Lymph node, needle/core biopsy, Left Inguinal with atypical lymphoid proliferation.   On 09/16/2018, the patient had biopsy completed with pathology showing: Lymph node for lymphoma, left inguinal with low grade follicular lymphoma.   PMHx, he has a hx of squamous cell carcinoma to bilateral sides of his face, none that is active.   SHx, he has had several cystoscopies within the past 4 months and his urologist is   On review of systems, he reports additional left inguinal lump x 4-5 months, intentional weight loss, recurrent kidney stones (most recently December 2019). he denies fever, chills, night sweats, no other areas of concerns, leg swelling, abdominal pain, flank pain, testicular pain/swelling, rashes, and any other symptoms. He hasn't had his prior stones  analyzed to determine the cause of them. He has intentionally lost 45 lbs in the past couple of weeks through keto diet and intermittent fasting. He doesn't consume bread or pasta. He works out with [employer] about every other day. His PCP is Vivi Barrack, MD and his Urologist is Dr. Irine Seal. Pertinent positives are listed and detailed within the above HPI.   Interval History:   [name] returns today for management and evaluation of his Follicular Lymphoma. The patient's last visit with [place] was on 08/13/2019. The pt reports that he is doing well overall.  The pt reports that he has been getting more exercise, walking up to 5 miles per day. Pt is also making renovations to his home and tends his garden regularly. He has received his COVID19 vaccines. Pt is currently following with his PCP once per year.   Lab results today (02/10/20) of CBC w/diff and CMP is as follows: all values are WNL except for Glucose at 100, BUN at 35, Creatinine at 1.47, GFR Est Non Af Am at 47. 02/10/2020 LDH at 165  On review of systems, pt denies fevers, chills, night sweats, new lumps/bumps, unexpected weight loss, new fatigue, abdominal pain, bowel habit changes and any other symptoms.    MEDICAL HISTORY:  Past Medical History:  Diagnosis Date  . Aneurysm ([place])    left common iliac-stable  . Arthritis   . Fasting hyperglycemia   . Follicular lymphoma ([place]) dx'd 07/2018  . History of kidney stones   . Hyperlipidemia    LDL goal = < 100 based on NMR Lipoproprofile  . Hypertension   . Mass of left  lower extremity    inner thigh  . Nephrolithiasis    left   . Renal insufficiency   . Sepsis ([place]) 03/24/2013  . Squamous cell carcinoma, face    Dr Sarajane Jews  . Ureteral stone with hydronephrosis 03/24/2013   left    SURGICAL HISTORY: Past Surgical History:  Procedure Laterality Date  . CATARACT EXTRACTION  2005   OD   . COLONOSCOPY W/ POLYPECTOMY  2007   adenoma X2 ;  [facility]  . CYSTOSCOPY W/  URETERAL STENT PLACEMENT Left 03/23/2013   Procedure: CYSTOSCOPY WITH RETROGRADE PYELOGRAM/URETERAL STENT PLACEMENT;  Surgeon: Fredricka Bonine, MD;  Location: [facility];  Service: Urology;  Laterality: Left;  . CYSTOSCOPY WITH RETROGRADE PYELOGRAM, URETEROSCOPY AND STENT PLACEMENT Bilateral 07/15/2018   Procedure: CYSTOSCOPY WITH RETROGRADE PYELOGRAM, URETEROSCOPY;  Surgeon: Irine Seal, MD;  [facility];  Service: Urology;  Laterality: Bilateral;  . CYSTOSCOPY WITH STENT PLACEMENT Left 07/15/2018   Procedure: CYSTOSCOPY WITH STENT PLACEMENT;  Surgeon: Irine Seal, MD;  [facility];  Service: Urology;  Laterality: Left;  . CYSTOSCOPY WITH URETEROSCOPY  02/28/2012   Procedure: CYSTOSCOPY WITH URETEROSCOPY;  Surgeon: Malka So, MD;  [facility];  Service: Urology;  Laterality: Left;  . CYSTOSCOPY/URETEROSCOPY/HOLMIUM LASER/STENT PLACEMENT Right 07/31/2018   Procedure: CYSTOSCOPY STENT REMOVAL  RIGHT URETEROSCOPY WITH HOLMIUM LASER POSSIBLE STENT PLACEMENT;  Surgeon: Irine Seal, MD;  Location: [facility];  Service: Urology;  Laterality: Right;  . HOLMIUM LASER APPLICATION Left 04/02/4966   Procedure: HOLMIUM LASER APPLICATION;  Surgeon: Malka So, MD;  [facility];  Service: Urology;  Laterality: Left;  . HOLMIUM LASER APPLICATION Right 59/16/3846   Procedure: HOLMIUM LASER APPLICATION;  Surgeon: Irine Seal, MD;  [facility];  Service: Urology;  Laterality: Right;  . INGUINAL LYMPH NODE BIOPSY Left 09/16/2018   Procedure: LEFT INGUINAL LYMPH NODE BIOPSY;  Surgeon: Erroll Luna, MD;  Location: [facility];  Service: General;  Laterality: Left;  . LITHOTRIPSY      X2 w/o benefit  . NEPHROLITHOTOMY  02/28/2012   Procedure: NEPHROLITHOTOMY PERCUTANEOUS;  Surgeon: Malka So, MD;  [facility];  Service: Urology;  Laterality: Left;  . TONSILLECTOMY    . URETEROSCOPY Left 03/31/2013   Procedure: URETEROSCOPY;  Surgeon: Malka So, MD;   [facility];  Service: Urology;  Laterality: Left;  Marland Kitchen VASECTOMY      SOCIAL HISTORY: Social History   Socioeconomic History  . Marital status: Married    Spouse name: Not on file  . Number of children: 2  . Years of education: 45  . Highest education level: Not on file  Occupational History  . Occupation: Facilities manager (retired)    Fish farm manager: [employer]  Tobacco Use  . Smoking status: Never Smoker  . Smokeless tobacco: Never Used  Vaping Use  . Vaping Use: Never used  Substance and Sexual Activity  . Alcohol use: Yes    Comment:  rarely  . Drug use: No  . Sexual activity: Not on file  Other Topics Concern  . Not on file  Social History Narrative   Fun: Work around [employer] and work out.    Social Determinants of Health   Financial Resource Strain:   . Difficulty of Paying Living Expenses:   Food Insecurity:   . Worried About Charity fundraiser in the Last Year:   . Arboriculturist in the Last Year:   Transportation Needs:   .  Lack of Transportation (Medical):   Marland Kitchen Lack of Transportation (Non-Medical):   Physical Activity:   . Days of Exercise per Week:   . Minutes of Exercise per Session:   Stress:   . Feeling of Stress :   Social Connections:   . Frequency of Communication with Friends and Family:   . Frequency of Social Gatherings with Friends and Family:   . Attends Religious Services:   . Active Member of Clubs or Organizations:   . Attends Archivist Meetings:   Marland Kitchen Marital Status:   Intimate Partner Violence:   . Fear of Current or Ex-Partner:   . Emotionally Abused:   Marland Kitchen Physically Abused:   . Sexually Abused:     FAMILY HISTORY: Family History  Problem Relation Age of Onset  . Heart attack Father 45        CBAG X5 vessels  . Hyperlipidemia Father   . Hypertension Father   . Heart disease Father        before age 36  . Hypertension Brother   . Aortic aneurysm Paternal Uncle        AAA ruptured  . Heart disease Mother    . Hyperlipidemia Mother   . Hypertension Mother   . Diabetes Neg Hx     ALLERGIES:  is allergic to angiotensin receptor blockers, ciprofloxacin, and pyridium [phenazopyridine].  MEDICATIONS:  Current Outpatient Medications  Medication Sig Dispense Refill  . amLODipine (NORVASC) 5 MG tablet Take 1 tablet (5 mg total) by mouth daily. 90 tablet 3  . atorvastatin (LIPITOR) 20 MG tablet Take 1 tablet (20 mg total) by mouth See admin instructions. Take 20 mg by mouth on Monday, Wednesday, Friday and Sunday 51 tablet 3  . baclofen (LIORESAL) 10 MG tablet Take 1 tablet (10 mg total) by mouth 3 (three) times daily as needed for muscle spasms. 30 each 0  . HYDROcodone-acetaminophen (NORCO/VICODIN) 5-325 MG tablet Take 1 tablet by mouth every 6 (six) hours as needed for moderate pain. 12 tablet 0  . metoprolol tartrate (LOPRESSOR) 25 MG tablet TAKE 1 TABLET BY MOUTH TWICE A DAY 180 tablet 3  . tamsulosin (FLOMAX) 0.4 MG CAPS capsule TAKE 1 CAPSULE BY MOUTH EVERYDAY AT BEDTIME 30 capsule 1   No current facility-administered medications for this visit.    REVIEW OF SYSTEMS:   A 10+ POINT REVIEW OF SYSTEMS WAS OBTAINED including neurology, dermatology, psychiatry, cardiac, respiratory, lymph, extremities, GI, GU, Musculoskeletal, constitutional, breasts, reproductive, HEENT.  All pertinent positives are noted in the HPI.  All others are negative.   PHYSICAL EXAMINATION: ECOG FS:1 - Symptomatic but completely ambulatory  Vitals:   02/10/20 1332  BP: 130/83  Pulse: 66  Resp: 18  Temp: 97.9 F (36.6 C)  SpO2: 100%   Wt Readings from Last 3 Encounters:  02/10/20 191 lb (86.6 kg)  08/13/19 184 lb 12.8 oz (83.8 kg)  05/11/19 185 lb 6.1 oz (84.1 kg)   Body mass index is 25.2 kg/m.    GENERAL:alert, in no acute distress and comfortable SKIN: no acute rashes, no significant lesions EYES: conjunctiva are pink and non-injected, sclera anicteric OROPHARYNX: MMM, no exudates, no oropharyngeal  erythema or ulceration NECK: supple, no JVD LYMPH:  no palpable lymphadenopathy in the cervical, axillary or inguinal regions LUNGS: clear to auscultation b/l with normal respiratory effort HEART: regular rate & rhythm ABDOMEN:  normoactive bowel sounds , non tender, not distended. No palpable hepatosplenomegaly.  Extremity: no pedal edema PSYCH: alert & oriented  x 3 with fluent speech NEURO: no focal motor/sensory deficits  LABORATORY DATA:  I have reviewed the data as listed  . CBC Latest Ref Rng & Units 02/10/2020 08/13/2019 05/11/2019  WBC 4.0 - 10.5 K/uL 6.2 5.6 4.5  Hemoglobin 13.0 - 17.0 g/dL 14.1 14.4 14.1  Hematocrit 39 - 52 % 43.8 44.2 43.0  Platelets 150 - 400 K/uL 167 156 154.0    . CMP Latest Ref Rng & Units 02/10/2020 08/13/2019 05/11/2019  Glucose 70 - 99 mg/dL 100(H) 107(H) 97  BUN 8 - 23 mg/dL 35(H) 36(H) 34(H)  Creatinine 0.61 - 1.24 mg/dL 1.47(H) 1.44(H) 1.60(H)  Sodium 135 - 145 mmol/L 141 142 140  Potassium 3.5 - 5.1 mmol/L 4.9 5.1 4.5  Chloride 98 - 111 mmol/L 105 104 102  CO2 22 - 32 mmol/L 27 28 31   Calcium 8.9 - 10.3 mg/dL 9.7 9.2 9.8  Total Protein 6.5 - 8.1 g/dL 6.5 6.7 6.3  Total Bilirubin 0.3 - 1.2 mg/dL 0.8 0.7 0.9  Alkaline Phos 38 - 126 U/L 72 69 66  AST 15 - 41 U/L 25 21 17   ALT 0 - 44 U/L 24 18 16   08/10/2019 CT Abdomen Pelvis Wo Contrast (Accession [id number])      RADIOGRAPHIC STUDIES: I have personally reviewed the radiological images as listed and agreed with the findings in the report. No results found.  ASSESSMENT & PLAN:   1. Low-grade follicular lymphoma- Stage I to II -First noticed a left inner thigh lump approximately 3-4 years ago and noted that it increased in size over the past couple of months.  -He had an US of the LLE on 07/16/2018 showed: Soft tissue masses in the left inguinal region most compatible with enlarged lymph nodes/adenopathy, the largest measuring up to 6.1 cm. 08/20/2018 Tissue-Flow Cytometry with monoclonal  B-cell population identified.  08/20/2018 LN Biopsy revealed Left Inguinal with atypical lymphoid proliferation.  09/16/2018: Lymph node biopsy, left inguinal with low grade follicular lymphoma.   09/29/18 Hep B and Hep C negative 10/08/18 PET/CT revealed Left inguinal and external iliac hypermetabolic adenopathy, consistent with active lymphoma. ([place] 4). 2. No extrapelvic disease identified. 3. Coronary artery atherosclerosis. Aortic Atherosclerosis. 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 08/10/2019 CT Abdomen Pelvis Wo Contrast (Accession [id number]) which revealed "1. Interval response to therapy as evidenced by decrease in size of left external iliac and left inguinal adenopathy. 2. Bilateral renal stones. 3. Aortic atherosclerosis (ICD10-I70.0). 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   -Pt is currently 1+ years from treatment. No indication to begin treatment at this time.  -Will continue watchful observance every 6 months with labs -Will repeat scans periodically (12-16 months) or as needed based on symptoms  -Will see back in 6 months with labs    FOLLOW UP: RTC with Dr Irene Limbo with labs in 6 months    The total time spent in the appt was 20 minutes and more than 50% was on counseling and direct patient cares.  All of the patient's questions were answered with apparent satisfaction. The patient knows to call the clinic with any problems, questions or concerns.    Sullivan Lone MD [place] AAHIVMS [facility] Hematology/Oncology Physician [place]  (Office):  [phone] (Work cell):  [phone] (Fax):           [phone]  02/10/2020 2:41 PM  I, Yevette Edwards, am acting as a  scribe for Dr. Sullivan Lone.   .I have reviewed the above documentation for accuracy and completeness, and I agree with the above. Brunetta Genera MD

## 2020-02-11 ENCOUNTER — Telehealth: Payer: Self-pay | Admitting: Hematology

## 2020-02-11 NOTE — Telephone Encounter (Signed)
Scheduled per 07/14 los, patient has been called and notified. 

## 2020-05-06 ENCOUNTER — Other Ambulatory Visit: Payer: Self-pay | Admitting: Family Medicine

## 2020-05-31 ENCOUNTER — Other Ambulatory Visit: Payer: Self-pay | Admitting: Family Medicine

## 2020-06-17 ENCOUNTER — Other Ambulatory Visit: Payer: Self-pay | Admitting: Family Medicine

## 2020-07-05 ENCOUNTER — Other Ambulatory Visit: Payer: Self-pay | Admitting: Family Medicine

## 2020-07-05 ENCOUNTER — Ambulatory Visit (INDEPENDENT_AMBULATORY_CARE_PROVIDER_SITE_OTHER): Payer: Medicare Other | Admitting: Family Medicine

## 2020-07-05 ENCOUNTER — Other Ambulatory Visit: Payer: Self-pay

## 2020-07-05 ENCOUNTER — Encounter: Payer: Self-pay | Admitting: Family Medicine

## 2020-07-05 VITALS — BP 128/75 | HR 52 | Temp 97.6°F | Ht 73.0 in | Wt 194.0 lb

## 2020-07-05 DIAGNOSIS — I10 Essential (primary) hypertension: Secondary | ICD-10-CM | POA: Diagnosis not present

## 2020-07-05 DIAGNOSIS — R7989 Other specified abnormal findings of blood chemistry: Secondary | ICD-10-CM | POA: Insufficient documentation

## 2020-07-05 DIAGNOSIS — E663 Overweight: Secondary | ICD-10-CM

## 2020-07-05 DIAGNOSIS — Z6825 Body mass index (BMI) 25.0-25.9, adult: Secondary | ICD-10-CM

## 2020-07-05 DIAGNOSIS — Z125 Encounter for screening for malignant neoplasm of prostate: Secondary | ICD-10-CM

## 2020-07-05 DIAGNOSIS — N1831 Chronic kidney disease, stage 3a: Secondary | ICD-10-CM | POA: Diagnosis not present

## 2020-07-05 DIAGNOSIS — Z0001 Encounter for general adult medical examination with abnormal findings: Secondary | ICD-10-CM

## 2020-07-05 DIAGNOSIS — R7301 Impaired fasting glucose: Secondary | ICD-10-CM | POA: Diagnosis not present

## 2020-07-05 DIAGNOSIS — Z13220 Encounter for screening for lipoid disorders: Secondary | ICD-10-CM | POA: Diagnosis not present

## 2020-07-05 DIAGNOSIS — Z1211 Encounter for screening for malignant neoplasm of colon: Secondary | ICD-10-CM

## 2020-07-05 MED ORDER — METOPROLOL TARTRATE 25 MG PO TABS: ORAL_TABLET | ORAL | 3 refills | Status: DC

## 2020-07-05 NOTE — Assessment & Plan Note (Signed)
Check A1c. 

## 2020-07-05 NOTE — Assessment & Plan Note (Signed)
Check CMET. 

## 2020-07-05 NOTE — Assessment & Plan Note (Signed)
At goal.  Continue metoprolol tartrate 12.5 mg twice daily and amlodipine 5 mg daily.

## 2020-07-05 NOTE — Progress Notes (Signed)
Chief Complaint:  Richard [name] is a 74 y.o. male who presents today for his annual comprehensive physical exam.    Assessment/Plan:  Chronic Problems Addressed Today: Fasting hyperglycemia Check A1c.   Essential hypertension At goal.  Continue metoprolol tartrate 12.5 mg twice daily and amlodipine 5 mg daily.  Low testosterone Check testosterone level today.  CKD (chronic kidney disease) stage 3, GFR 30-59 ml/min Check CMET.   Body mass index is 25.6 kg/m. / Overweight  BMI Metric Follow Up - 07/05/20 1109      BMI Metric Follow Up-Please document annually   BMI Metric Follow Up Education provided            Preventative Healthcare: Place order for Cologuard.  Check CBC, CMET, TSH, lipid panel, and PSA.  Up-to-date on vaccines.  Patient Counseling(The following topics were reviewed and/or handout was given):  -Nutrition: Stressed importance of moderation in sodium/caffeine intake, saturated fat and cholesterol, caloric balance, sufficient intake of fresh fruits, vegetables, and fiber.  -Stressed the importance of regular exercise.   -Substance Abuse: Discussed cessation/primary prevention of tobacco, alcohol, or other drug use; driving or other dangerous activities under the influence; availability of treatment for abuse.   -Injury prevention: Discussed safety belts, safety helmets, smoke detector, smoking near bedding or upholstery.   -Sexuality: Discussed sexually transmitted diseases, partner selection, use of condoms, avoidance of unintended pregnancy and contraceptive alternatives.   -Dental health: Discussed importance of regular tooth brushing, flossing, and dental visits.  -Health maintenance and immunizations reviewed. Please refer to Health maintenance section.  Return to care in 1 year for next preventative visit.     Subjective:  HPI:  He has no acute complaints today.   Lifestyle Diet: Balanced.  Exercise: Lifts weights 3 times weekly. Busy at  home.   Depression screen PHQ 2/9 07/05/2020  Decreased Interest 0  Down, Depressed, Hopeless 0  PHQ - 2 Score 0    Health Maintenance Due  Topic Date Due  . PNA vac Low Risk Adult (1 of 2 - PCV13) Never done  . COVID-19 Vaccine (2 - Pfizer 2-dose series) 05/31/2020     ROS: Per HPI, otherwise a complete review of systems was negative.   PMH:  The following were reviewed and entered/updated in epic: Past Medical History:  Diagnosis Date  . Aneurysm ([place])    left common iliac-stable  . Arthritis   . Fasting hyperglycemia   . Follicular lymphoma ([place]) dx'd 07/2018  . History of kidney stones   . Hyperlipidemia    LDL goal = < 100 based on NMR Lipoproprofile  . Hypertension   . Mass of left lower extremity    inner thigh  . Nephrolithiasis    left   . Renal insufficiency   . Sepsis ([place]) 03/24/2013  . Squamous cell carcinoma, face    Dr Sarajane Jews  . Ureteral stone with hydronephrosis 03/24/2013   left   Patient Active Problem List   Diagnosis Date Noted  . Low testosterone 07/05/2020  . CKD (chronic kidney disease) stage 3, GFR 30-59 ml/min (H[facility]) 05/11/2019  . Restless legs syndrome 05/11/2019  . Intermittent low back pain 05/11/2019  . Follicular lymphoma grade I of intrapelvic lymph nodes ([place]) 12/16/2018  . Erectile dysfunction 06/26/2017  . Common iliac aneurysm ([place]) 08/01/2011  . ADENOMATOUS COLONIC POLYP 03/08/2009  . Fasting hyperglycemia 03/08/2009  . HYPERLIPIDEMIA 09/02/2007  . Essential hypertension 09/02/2007  . NEPHROLITHIASIS, HX OF 09/02/2007   Past Surgical History:  Procedure  Laterality Date  . CATARACT EXTRACTION  2005   OD   . COLONOSCOPY W/ POLYPECTOMY  2007   adenoma X2 ; [facility]  . CYSTOSCOPY W/ URETERAL STENT PLACEMENT Left 03/23/2013   Procedure: CYSTOSCOPY WITH RETROGRADE PYELOGRAM/URETERAL STENT PLACEMENT;  Surgeon: Fredricka Bonine, MD;  [facility];  Service: Urology;  Laterality: Left;  . CYSTOSCOPY WITH RETROGRADE  PYELOGRAM, URETEROSCOPY AND STENT PLACEMENT Bilateral 07/15/2018   Procedure: CYSTOSCOPY WITH RETROGRADE PYELOGRAM, URETEROSCOPY;  Surgeon: Irine Seal, MD;  [facility];  Service: Urology;  Laterality: Bilateral;  . CYSTOSCOPY WITH STENT PLACEMENT Left 07/15/2018   Procedure: CYSTOSCOPY WITH STENT PLACEMENT;  Surgeon: Irine Seal, MD;  [facility];  Service: Urology;  Laterality: Left;  . CYSTOSCOPY WITH URETEROSCOPY  02/28/2012   Procedure: CYSTOSCOPY WITH URETEROSCOPY;  Surgeon: Malka So, MD;  [facility];  Service: Urology;  Laterality: Left;  . CYSTOSCOPY/URETEROSCOPY/HOLMIUM LASER/STENT PLACEMENT Right 07/31/2018   Procedure: CYSTOSCOPY STENT REMOVAL  RIGHT URETEROSCOPY WITH HOLMIUM LASER POSSIBLE STENT PLACEMENT;  Surgeon: Irine Seal, MD;  [facility];  Service: Urology;  Laterality: Right;  . HOLMIUM LASER APPLICATION Left 10/02/1441   Procedure: HOLMIUM LASER APPLICATION;  Surgeon: Malka So, MD;  [facility];  Service: Urology;  Laterality: Left;  . HOLMIUM LASER APPLICATION Right 15/40/0867   Procedure: HOLMIUM LASER APPLICATION;  Surgeon: Irine Seal, MD;  [facility];  Service: Urology;  Laterality: Right;  . INGUINAL LYMPH NODE BIOPSY Left 09/16/2018   Procedure: LEFT INGUINAL LYMPH NODE BIOPSY;  Surgeon: Erroll Luna, MD;  Location: [facility];  Service: General;  Laterality: Left;  . LITHOTRIPSY      X2 w/o benefit  . NEPHROLITHOTOMY  02/28/2012   Procedure: NEPHROLITHOTOMY PERCUTANEOUS;  Surgeon: Malka So, MD;  [facility];  Service: Urology;  Laterality: Left;  . TONSILLECTOMY    . URETEROSCOPY Left 03/31/2013   Procedure: URETEROSCOPY;  Surgeon: Malka So, MD;  [facility];  Service: Urology;  Laterality: Left;  Marland Kitchen VASECTOMY      Family History  Problem Relation Age of Onset  . Heart attack Father 65        CBAG X5 vessels  . Hyperlipidemia Father   . Hypertension Father   . Heart disease  Father        before age 34  . Hypertension Brother   . Aortic aneurysm Paternal Uncle        AAA ruptured  . Heart disease Mother   . Hyperlipidemia Mother   . Hypertension Mother   . Diabetes Neg Hx     Medications- reviewed and updated Current Outpatient Medications  Medication Sig Dispense Refill  . amLODipine (NORVASC) 5 MG tablet TAKE 1 TABLET BY MOUTH EVERY DAY 90 tablet 3  . atorvastatin (LIPITOR) 20 MG tablet TAKE 1 TABLET BY MOUTH ON MON,WED, FRI, AND SUN 51 tablet 3  . baclofen (LIORESAL) 10 MG tablet Take 1 tablet (10 mg total) by mouth 3 (three) times daily as needed for muscle spasms. 30 each 0  . ferrous sulfate 324 MG TBEC Take 324 mg by mouth.    Marland Kitchen HYDROcodone-acetaminophen (NORCO/VICODIN) 5-325 MG tablet Take 1 tablet by mouth every 6 (six) hours as needed for moderate pain. 12 tablet 0  . metoprolol tartrate (LOPRESSOR) 25 MG tablet TAKE 0.5 TABLETS (12.5 MG TOTAL) BY MOUTH 2 (TWO) TIMES DAILY. 90 tablet 3  . tamsulosin (FLOMAX) 0.4 MG CAPS capsule TAKE  1 CAPSULE BY MOUTH EVERYDAY AT BEDTIME 30 capsule 1   No current facility-administered medications for this visit.    Allergies-reviewed and updated Allergies  Allergen Reactions  . Angiotensin Receptor Blockers     Angioedema with ACE-I  . Ciprofloxacin     Rash on combination of Cipro & Pyridium after cystoscopic & open resection of renal calculi  . Pyridium [Phenazopyridine]     In combo with Cipro    Social History   Socioeconomic History  . Marital status: Married    Spouse name: Not on file  . Number of children: 2  . Years of education: 38  . Highest education level: Not on file  Occupational History  . Occupation: Facilities manager (retired)    Fish farm manager: [employer]  Tobacco Use  . Smoking status: Never Smoker  . Smokeless tobacco: Never Used  Vaping Use  . Vaping Use: Never used  Substance and Sexual Activity  . Alcohol use: Yes    Comment:  rarely  . Drug use: No  . Sexual  activity: Not on file  Other Topics Concern  . Not on file  Social History Narrative   Fun: Work around [employer] and work out.    Social Determinants of Health   Financial Resource Strain:   . Difficulty of Paying Living Expenses: Not on file  Food Insecurity:   . Worried About Charity fundraiser in the Last Year: Not on file  . Ran Out of Food in the Last Year: Not on file  Transportation Needs:   . Lack of Transportation (Medical): Not on file  . Lack of Transportation (Non-Medical): Not on file  Physical Activity:   . Days of Exercise per Week: Not on file  . Minutes of Exercise per Session: Not on file  Stress:   . Feeling of Stress : Not on file  Social Connections:   . Frequency of Communication with Friends and Family: Not on file  . Frequency of Social Gatherings with Friends and Family: Not on file  . Attends Religious Services: Not on file  . Active Member of Clubs or Organizations: Not on file  . Attends Archivist Meetings: Not on file  . Marital Status: Not on file        Objective:  Physical Exam: BP 128/75   Pulse (!) 52   Temp 97.6 F (36.4 C) (Temporal)   Ht 6\' 1"  (1.854 m)   Wt 194 lb (88 kg)   SpO2 100%   BMI 25.60 kg/m   Body mass index is 25.6 kg/m. Wt Readings from Last 3 Encounters:  07/05/20 194 lb (88 kg)  02/10/20 191 lb (86.6 kg)  08/13/19 184 lb 12.8 oz (83.8 kg)   Gen: NAD, resting comfortably HEENT: TMs normal bilaterally. OP clear. No thyromegaly noted.  CV: RRR with no murmurs appreciated Pulm: NWOB, CTAB with no crackles, wheezes, or rhonchi GI: Normal bowel sounds present. Soft, Nontender, Nondistended. MSK: no edema, cyanosis, or clubbing noted Skin: warm, dry Neuro: CN2-12 grossly intact. Strength 5/5 in upper and lower extremities. Reflexes symmetric and intact bilaterally.  Psych: Normal affect and thought content     Ioan Landini M. Jerline Pain, MD 07/05/2020 11:09 AM

## 2020-07-05 NOTE — Patient Instructions (Signed)
It was very nice to see you today!  We will check blood work and order a cologuard.  Keep working on diet and exercise.  I will see you back in a year for your next physical. Please come back to see me sooner if needed.  Take care, Dr Richard [name]  Please try these tips to maintain a healthy lifestyle:   Eat at least 3 REAL meals and 1-2 snacks per day.  Aim for no more than 5 hours between eating.  If you eat breakfast, please do so within one hour of getting up.    Each meal should contain half fruits/vegetables, one quarter protein, and one quarter carbs (no bigger than a computer mouse)   Cut down on sweet beverages. This includes juice, soda, and sweet tea.     Drink at least 1 glass of water with each meal and aim for at least 8 glasses per day   Exercise at least 150 minutes every week.    Preventive Care 70 Years and Older, Male Preventive care refers to lifestyle choices and visits with your health care provider that can promote health and wellness. This includes:  A yearly physical exam. This is also called an annual well check.  Regular dental and eye exams.  Immunizations.  Screening for certain conditions.  Healthy lifestyle choices, such as diet and exercise. What can I expect for my preventive care visit? Physical exam Your health care provider will check:  Height and weight. These may be used to calculate body mass index (BMI), which is a measurement that tells if you are at a healthy weight.  Heart rate and blood pressure.  Your skin for abnormal spots. Counseling Your health care provider may ask you questions about:  Alcohol, tobacco, and drug use.  Emotional well-being.  Home and relationship well-being.  Sexual activity.  Eating habits.  History of falls.  Memory and ability to understand (cognition).  Work and work Astronomer. What immunizations do I need?  Influenza (flu) vaccine  This is recommended every year. Tetanus,  diphtheria, and pertussis (Tdap) vaccine  You may need a Td booster every 10 years. Varicella (chickenpox) vaccine  You may need this vaccine if you have not already been vaccinated. Zoster (shingles) vaccine  You may need this after age 30. Pneumococcal conjugate (PCV13) vaccine  One dose is recommended after age 39. Pneumococcal polysaccharide (PPSV23) vaccine  One dose is recommended after age 83. Measles, mumps, and rubella (MMR) vaccine  You may need at least one dose of MMR if you were born in 1957 or later. You may also need a second dose. Meningococcal conjugate (MenACWY) vaccine  You may need this if you have certain conditions. Hepatitis A vaccine  You may need this if you have certain conditions or if you travel or work in places where you may be exposed to hepatitis A. Hepatitis B vaccine  You may need this if you have certain conditions or if you travel or work in places where you may be exposed to hepatitis B. Haemophilus influenzae type b (Hib) vaccine  You may need this if you have certain conditions. You may receive vaccines as individual doses or as more than one vaccine together in one shot (combination vaccines). Talk with your health care provider about the risks and benefits of combination vaccines. What tests do I need? Blood tests  Lipid and cholesterol levels. These may be checked every 5 years, or more frequently depending on your overall health.  Hepatitis C  test.  Hepatitis B test. Screening  Lung cancer screening. You may have this screening every year starting at age 54 if you have a 30-pack-year history of smoking and currently smoke or have quit within the past 15 years.  Colorectal cancer screening. All adults should have this screening starting at age 64 and continuing until age 68. Your health care provider may recommend screening at age 50 if you are at increased risk. You will have tests every 1-10 years, depending on your results and  the type of screening test.  Prostate cancer screening. Recommendations will vary depending on your family history and other risks.  Diabetes screening. This is done by checking your blood sugar (glucose) after you have not eaten for a while (fasting). You may have this done every 1-3 years.  Abdominal aortic aneurysm (AAA) screening. You may need this if you are a current or former smoker.  Sexually transmitted disease (STD) testing. Follow these instructions at home: Eating and drinking  Eat a diet that includes fresh fruits and vegetables, whole grains, lean protein, and low-fat dairy products. Limit your intake of foods with high amounts of sugar, saturated fats, and salt.  Take vitamin and mineral supplements as recommended by your health care provider.  Do not drink alcohol if your health care provider tells you not to drink.  If you drink alcohol: ? Limit how much you have to 0-2 drinks a day. ? Be aware of how much alcohol is in your drink. In the [place] one drink equals one 12 oz bottle of beer (355 mL), one 5 oz glass of wine (148 mL), or one 1 oz glass of hard liquor (44 mL). Lifestyle  Take daily care of your teeth and gums.  Stay active. Exercise for at least 30 minutes on 5 or more days each week.  Do not use any products that contain nicotine or tobacco, such as cigarettes, e-cigarettes, and chewing tobacco. If you need help quitting, ask your health care provider.  If you are sexually active, practice safe sex. Use a condom or other form of protection to prevent STIs (sexually transmitted infections).  Talk with your health care provider about taking a low-dose aspirin or statin. What's next?  Visit your health care provider once a year for a well check visit.  Ask your health care provider how often you should have your eyes and teeth checked.  Stay up to date on all vaccines. This information is not intended to replace advice given to you by your health care  provider. Make sure you discuss any questions you have with your health care provider. Document Revised: 07/10/2018 Document Reviewed: 07/10/2018 Elsevier Patient Education  2020 [employer].

## 2020-07-05 NOTE — Assessment & Plan Note (Signed)
Check testosterone level today.  

## 2020-07-06 LAB — COMPREHENSIVE METABOLIC PANEL
AG Ratio: 2.4 (calc) (ref 1.0–2.5)
ALT: 19 U/L (ref 9–46)
AST: 18 U/L (ref 10–35)
Albumin: 4.5 g/dL (ref 3.6–5.1)
Alkaline phosphatase (APISO): 64 U/L (ref 35–144)
BUN/Creatinine Ratio: 26 (calc) — ABNORMAL HIGH (ref 6–22)
BUN: 32 mg/dL — ABNORMAL HIGH (ref 7–25)
CO2: 30 mmol/L (ref 20–32)
Calcium: 9.4 mg/dL (ref 8.6–10.3)
Chloride: 104 mmol/L (ref 98–110)
Creat: 1.24 mg/dL — ABNORMAL HIGH (ref 0.70–1.18)
Globulin: 1.9 g/dL (calc) (ref 1.9–3.7)
Glucose, Bld: 100 mg/dL — ABNORMAL HIGH (ref 65–99)
Potassium: 5.1 mmol/L (ref 3.5–5.3)
Sodium: 140 mmol/L (ref 135–146)
Total Bilirubin: 0.8 mg/dL (ref 0.2–1.2)
Total Protein: 6.4 g/dL (ref 6.1–8.1)

## 2020-07-06 LAB — CBC
HCT: 45.3 % (ref 38.5–50.0)
Hemoglobin: 14.9 g/dL (ref 13.2–17.1)
MCH: 27.7 pg (ref 27.0–33.0)
MCHC: 32.9 g/dL (ref 32.0–36.0)
MCV: 84.2 fL (ref 80.0–100.0)
MPV: 10.1 fL (ref 7.5–12.5)
Platelets: 159 10*3/uL (ref 140–400)
RBC: 5.38 10*6/uL (ref 4.20–5.80)
RDW: 13.1 % (ref 11.0–15.0)
WBC: 4.9 10*3/uL (ref 3.8–10.8)

## 2020-07-06 LAB — PSA: PSA: 1.64 ng/mL (ref <=4.0)

## 2020-07-06 LAB — HEMOGLOBIN A1C
Hgb A1c MFr Bld: 5.4 % of total Hgb (ref <5.7)
Mean Plasma Glucose: 108 mg/dL
eAG (mmol/L): 6 mmol/L

## 2020-07-06 LAB — LIPID PANEL
Cholesterol: 155 mg/dL (ref <200)
HDL: 71 mg/dL (ref >=40)
LDL Cholesterol (Calc): 71 mg/dL (calc)
Non-HDL Cholesterol (Calc): 84 mg/dL (calc) (ref <130)
Total CHOL/HDL Ratio: 2.2 (calc) (ref <5.0)
Triglycerides: 58 mg/dL (ref <150)

## 2020-07-06 LAB — TSH: TSH: 3.65 mIU/L (ref 0.40–4.50)

## 2020-07-06 NOTE — Progress Notes (Signed)
Please inform patient of the following:  Labs are all stable. Would like for him to keep up the good work and we can recheck in a year.  Algis Greenhouse. Jerline Pain, MD 07/06/2020 8:49 AM

## 2020-07-20 DIAGNOSIS — Z03818 Encounter for observation for suspected exposure to other biological agents ruled out: Secondary | ICD-10-CM | POA: Diagnosis not present

## 2020-08-10 ENCOUNTER — Ambulatory Visit: Payer: Medicare Other | Admitting: Hematology

## 2020-08-10 ENCOUNTER — Other Ambulatory Visit: Payer: Medicare Other

## 2020-08-11 ENCOUNTER — Other Ambulatory Visit: Payer: Self-pay

## 2020-08-11 ENCOUNTER — Inpatient Hospital Stay: Payer: Medicare Other | Admitting: Hematology

## 2020-08-11 ENCOUNTER — Inpatient Hospital Stay: Payer: Medicare Other | Attending: Hematology

## 2020-08-11 VITALS — BP 137/81 | Temp 97.0°F | Resp 20 | Ht 73.0 in | Wt 194.0 lb

## 2020-08-11 DIAGNOSIS — C8295 Follicular lymphoma, unspecified, lymph nodes of inguinal region and lower limb: Secondary | ICD-10-CM

## 2020-08-11 DIAGNOSIS — C8215 Follicular lymphoma grade II, lymph nodes of inguinal region and lower limb: Secondary | ICD-10-CM | POA: Insufficient documentation

## 2020-08-11 LAB — CBC WITH DIFFERENTIAL/PLATELET
Abs Immature Granulocytes: 0.03 10*3/uL (ref 0.00–0.07)
Basophils Absolute: 0 10*3/uL (ref 0.0–0.1)
Basophils Relative: 1 %
Eosinophils Absolute: 0.2 10*3/uL (ref 0.0–0.5)
Eosinophils Relative: 3 %
HCT: 44.6 % (ref 39.0–52.0)
Hemoglobin: 14.2 g/dL (ref 13.0–17.0)
Immature Granulocytes: 1 %
Lymphocytes Relative: 20 %
Lymphs Abs: 1.2 10*3/uL (ref 0.7–4.0)
MCH: 27.2 pg (ref 26.0–34.0)
MCHC: 31.8 g/dL (ref 30.0–36.0)
MCV: 85.4 fL (ref 80.0–100.0)
Monocytes Absolute: 0.5 10*3/uL (ref 0.1–1.0)
Monocytes Relative: 9 %
Neutro Abs: 4.1 10*3/uL (ref 1.7–7.7)
Neutrophils Relative %: 66 %
Platelets: 131 10*3/uL — ABNORMAL LOW (ref 150–400)
RBC: 5.22 MIL/uL (ref 4.22–5.81)
RDW: 14.4 % (ref 11.5–15.5)
WBC: 6.1 10*3/uL (ref 4.0–10.5)
nRBC: 0 % (ref 0.0–0.2)

## 2020-08-11 LAB — CMP (CANCER CENTER ONLY)
ALT: 19 U/L (ref 0–44)
AST: 17 U/L (ref 15–41)
Albumin: 3.9 g/dL (ref 3.5–5.0)
Alkaline Phosphatase: 55 U/L (ref 38–126)
Anion gap: 5 (ref 5–15)
BUN: 30 mg/dL — ABNORMAL HIGH (ref 8–23)
CO2: 29 mmol/L (ref 22–32)
Calcium: 9.1 mg/dL (ref 8.9–10.3)
Chloride: 106 mmol/L (ref 98–111)
Creatinine: 1.43 mg/dL — ABNORMAL HIGH (ref 0.61–1.24)
GFR, Estimated: 52 mL/min — ABNORMAL LOW (ref >60)
Glucose, Bld: 102 mg/dL — ABNORMAL HIGH (ref 70–99)
Potassium: 4.2 mmol/L (ref 3.5–5.1)
Sodium: 140 mmol/L (ref 135–145)
Total Bilirubin: 0.8 mg/dL (ref 0.3–1.2)
Total Protein: 6.2 g/dL — ABNORMAL LOW (ref 6.5–8.1)

## 2020-08-11 LAB — LACTATE DEHYDROGENASE: LDH: 153 U/L (ref 98–192)

## 2020-08-11 NOTE — Progress Notes (Signed)
HEMATOLOGY/ONCOLOGY CLINIC NOTE  Date of Service: 08/11/2020  Patient Care Team: Vivi Barrack, MD as PCP - General (Family Medicine) Brunetta Genera, MD as Consulting Physician (Hematology) Irine Seal, MD as Attending Physician (Urology) Dyke Maes, OD as Consulting Physician (Optometry)  CHIEF COMPLAINTS/PURPOSE OF CONSULTATION:  F/u for follicular lymphoma  HISTORY OF PRESENTING ILLNESS:   BISMARK STEVESON is a wonderful 75 y.o. male who has been referred to [place] by Dr. Brantley Stage for evaluation and management of low grade follicular lymphoma. He is being accompanied by his wife, Velva Harman today. He notes that he first noticed a lump to his left inner thigh during a shower approximately 3-4 years ago and noted that it increased in size over the past couple of months.   He had an US of the LLE on 07/16/2018 showed: Soft tissue masses in the left inguinal region most compatible with enlarged lymph nodes/adenopathy, the largest measuring up to 6.1 cm.  On 08/20/2018, the patient underwent cytometry that showed: Tissue-Flow Cytometry with monoclonal B-cell population identified.   Lymph node biopsy on 08/20/2018 showed: Lymph node, needle/core biopsy, Left Inguinal with atypical lymphoid proliferation.   On 09/16/2018, the patient had biopsy completed with pathology showing: Lymph node for lymphoma, left inguinal with low grade follicular lymphoma.   PMHx, he has a hx of squamous cell carcinoma to bilateral sides of his face, none that is active.   SHx, he has had several cystoscopies within the past 4 months and his urologist is   On review of systems, he reports additional left inguinal lump x 4-5 months, intentional weight loss, recurrent kidney stones (most recently December 2019). he denies fever, chills, night sweats, no other areas of concerns, leg swelling, abdominal pain, flank pain, testicular pain/swelling, rashes, and any other symptoms. He hasn't had his prior stones  analyzed to determine the cause of them. He has intentionally lost 45 lbs in the past couple of weeks through keto diet and intermittent fasting. He doesn't consume bread or pasta. He works out with [employer] about every other day. His PCP is Vivi Barrack, MD and his Urologist is Dr. Irine Seal. Pertinent positives are listed and detailed within the above HPI.   Interval History:   KARRIEM PILLAR returns today for management and evaluation of his Follicular Lymphoma. The patient's last visit with [place] was on 02/10/2020. The pt reports that he is doing well overall.  The pt reports no new lymphadenopathy. No fevers/chills/nightsweats. Has been staying active.  No other acute new symptoms.  Labs- reviewed with patient today - stable.   MEDICAL HISTORY:  Past Medical History:  Diagnosis Date  . Aneurysm ([place])    left common iliac-stable  . Arthritis   . Fasting hyperglycemia   . Follicular lymphoma ([place]) dx'd 07/2018  . History of kidney stones   . Hyperlipidemia    LDL goal = < 100 based on NMR Lipoproprofile  . Hypertension   . Mass of left lower extremity    inner thigh  . Nephrolithiasis    left   . Renal insufficiency   . Sepsis ([place]) 03/24/2013  . Squamous cell carcinoma, face    Dr Sarajane Jews  . Ureteral stone with hydronephrosis 03/24/2013   left    SURGICAL HISTORY: Past Surgical History:  Procedure Laterality Date  . CATARACT EXTRACTION  2005   OD   . COLONOSCOPY W/ POLYPECTOMY  2007   adenoma X2 ; [facility]  . CYSTOSCOPY W/ URETERAL STENT  PLACEMENT Left 03/23/2013   Procedure: CYSTOSCOPY WITH RETROGRADE PYELOGRAM/URETERAL STENT PLACEMENT;  Surgeon: Fredricka Bonine, MD;  Location: [facility];  Service: Urology;  Laterality: Left;  . CYSTOSCOPY WITH RETROGRADE PYELOGRAM, URETEROSCOPY AND STENT PLACEMENT Bilateral 07/15/2018   Procedure: CYSTOSCOPY WITH RETROGRADE PYELOGRAM, URETEROSCOPY;  Surgeon: Irine Seal, MD;  Location: [facility];  Service: Urology;   Laterality: Bilateral;  . CYSTOSCOPY WITH STENT PLACEMENT Left 07/15/2018   Procedure: CYSTOSCOPY WITH STENT PLACEMENT;  Surgeon: Irine Seal, MD;  Location: [facility];  Service: Urology;  Laterality: Left;  . CYSTOSCOPY WITH URETEROSCOPY  02/28/2012   Procedure: CYSTOSCOPY WITH URETEROSCOPY;  Surgeon: Malka So, MD;  Location: [facility];  Service: Urology;  Laterality: Left;  . CYSTOSCOPY/URETEROSCOPY/HOLMIUM LASER/STENT PLACEMENT Right 07/31/2018   Procedure: CYSTOSCOPY STENT REMOVAL  RIGHT URETEROSCOPY WITH HOLMIUM LASER POSSIBLE STENT PLACEMENT;  Surgeon: Irine Seal, MD;  [facility];  Service: Urology;  Laterality: Right;  . HOLMIUM LASER APPLICATION Left 02/04/2955   Procedure: HOLMIUM LASER APPLICATION;  Surgeon: Malka So, MD;  Location: [facility];  Service: Urology;  Laterality: Left;  . HOLMIUM LASER APPLICATION Right 21/30/8657   Procedure: HOLMIUM LASER APPLICATION;  Surgeon: Irine Seal, MD;  Location: [facility];  Service: Urology;  Laterality: Right;  . INGUINAL LYMPH NODE BIOPSY Left 09/16/2018   Procedure: LEFT INGUINAL LYMPH NODE BIOPSY;  Surgeon: Erroll Luna, MD;  Location: [facility];  Service: General;  Laterality: Left;  . LITHOTRIPSY      X2 w/o benefit  . NEPHROLITHOTOMY  02/28/2012   Procedure: NEPHROLITHOTOMY PERCUTANEOUS;  Surgeon: Malka So, MD;  Location: [facility];  Service: Urology;  Laterality: Left;  . TONSILLECTOMY    . URETEROSCOPY Left 03/31/2013   Procedure: URETEROSCOPY;  Surgeon: Malka So, MD;  Location: [facility];  Service: Urology;  Laterality: Left;  Marland Kitchen VASECTOMY      SOCIAL HISTORY: Social History   Socioeconomic History  . Marital status: Married    Spouse name: Not on file  . Number of children: 2  . Years of education: 73  . Highest education level: Not on file  Occupational History  . Occupation: Facilities manager (retired)    Fish farm manager: [employer]  Tobacco Use  . Smoking status: Never Smoker  . Smokeless  tobacco: Never Used  Vaping Use  . Vaping Use: Never used  Substance and Sexual Activity  . Alcohol use: Yes    Comment:  rarely  . Drug use: No  . Sexual activity: Not on file  Other Topics Concern  . Not on file  Social History Narrative   Fun: Work around [employer] and work out.    Social Determinants of Health   Financial Resource Strain: Not on file  Food Insecurity: Not on file  Transportation Needs: Not on file  Physical Activity: Not on file  Stress: Not on file  Social Connections: Not on file  Intimate Partner Violence: Not on file    FAMILY HISTORY: Family History  Problem Relation Age of Onset  . Heart attack Father 43        CBAG X5 vessels  . Hyperlipidemia Father   . Hypertension Father   . Heart disease Father        before age 59  . Hypertension Brother   . Aortic aneurysm Paternal Uncle        AAA ruptured  . Heart disease Mother   . Hyperlipidemia Mother   . Hypertension Mother   .  Diabetes Neg Hx     ALLERGIES:  is allergic to angiotensin receptor blockers, ciprofloxacin, and pyridium [phenazopyridine].  MEDICATIONS:  Current Outpatient Medications  Medication Sig Dispense Refill  . amLODipine (NORVASC) 5 MG tablet TAKE 1 TABLET BY MOUTH EVERY DAY 90 tablet 3  . atorvastatin (LIPITOR) 20 MG tablet TAKE 1 TABLET BY MOUTH ON MON,WED, FRI, AND SUN 51 tablet 3  . baclofen (LIORESAL) 10 MG tablet TAKE 1 TABLET BY MOUTH THREE TIMES A DAY AS NEEDED FOR MUSCLE SPASMS 30 tablet 5  . ferrous sulfate 324 MG TBEC Take 324 mg by mouth.    Marland Kitchen HYDROcodone-acetaminophen (NORCO/VICODIN) 5-325 MG tablet Take 1 tablet by mouth every 6 (six) hours as needed for moderate pain. 12 tablet 0  . metoprolol tartrate (LOPRESSOR) 25 MG tablet TAKE 0.5 TABLETS (12.5 MG TOTAL) BY MOUTH 2 (TWO) TIMES DAILY. 90 tablet 3  . tamsulosin (FLOMAX) 0.4 MG CAPS capsule TAKE 1 CAPSULE BY MOUTH EVERYDAY AT BEDTIME 30 capsule 1   No current facility-administered medications for this  visit.    REVIEW OF SYSTEMS:   A 10+ POINT REVIEW OF SYSTEMS WAS OBTAINED including neurology, dermatology, psychiatry, cardiac, respiratory, lymph, extremities, GI, GU, Musculoskeletal, constitutional, breasts, reproductive, HEENT.  All pertinent positives are noted in the HPI.  All others are negative.   PHYSICAL EXAMINATION: ECOG FS:1 - Symptomatic but completely ambulatory  .BP 137/81   Temp (!) 97 F (36.1 C) (Tympanic)   Resp 20   Ht 6\' 1"  (1.854 m)   Wt 194 lb (88 kg)   SpO2 98%   BMI 25.60 kg/m   There were no vitals filed for this visit. Wt Readings from Last 3 Encounters:  07/05/20 194 lb (88 kg)  02/10/20 191 lb (86.6 kg)  08/13/19 184 lb 12.8 oz (83.8 kg)   Body mass index is 25.6 kg/m.    NAD GENERAL:alert, in no acute distress and comfortable SKIN: no acute rashes, no significant lesions EYES: conjunctiva are pink and non-injected, sclera anicteric OROPHARYNX: MMM, no exudates, no oropharyngeal erythema or ulceration NECK: supple, no JVD LYMPH:  no palpable lymphadenopathy in the cervical, axillary or inguinal regions LUNGS: clear to auscultation b/l with normal respiratory effort HEART: regular rate & rhythm ABDOMEN:  normoactive bowel sounds , non tender, not distended. No palpable hepatosplenomegaly.  Extremity: no pedal edema PSYCH: alert & oriented x 3 with fluent speech NEURO: no focal motor/sensory deficits  LABORATORY DATA:  I have reviewed the data as listed  . CBC Latest Ref Rng & Units 07/05/2020 02/10/2020 08/13/2019  WBC 3.8 - 10.8 Thousand/uL 4.9 6.2 5.6  Hemoglobin 13.2 - 17.1 g/dL 14.9 14.1 14.4  Hematocrit 38.5 - 50.0 % 45.3 43.8 44.2  Platelets 140 - 400 Thousand/uL 159 167 156    . CMP Latest Ref Rng & Units 07/05/2020 02/10/2020 08/13/2019  Glucose 65 - 99 mg/dL 100(H) 100(H) 107(H)  BUN 7 - 25 mg/dL 32(H) 35(H) 36(H)  Creatinine 0.70 - 1.18 mg/dL 1.24(H) 1.47(H) 1.44(H)  Sodium 135 - 146 mmol/L 140 141 142  Potassium 3.5 - 5.3  mmol/L 5.1 4.9 5.1  Chloride 98 - 110 mmol/L 104 105 104  CO2 20 - 32 mmol/L 30 27 28   Calcium 8.6 - 10.3 mg/dL 9.4 9.7 9.2  Total Protein 6.1 - 8.1 g/dL 6.4 6.5 6.7  Total Bilirubin 0.2 - 1.2 mg/dL 0.8 0.8 0.7  Alkaline Phos 38 - 126 U/L - 72 69  AST 10 - 35 U/L 18 25 21  ALT 9 - 46 U/L 19 24 18   08/10/2019 CT Abdomen Pelvis Wo Contrast (Accession [id number])      RADIOGRAPHIC STUDIES: I have personally reviewed the radiological images as listed and agreed with the findings in the report. No results found.  ASSESSMENT & PLAN:   1. Low-grade follicular lymphoma- Stage I to II -First noticed a left inner thigh lump approximately 3-4 years ago and noted that it increased in size over the past couple of months.  -He had an US of the LLE on 07/16/2018 showed: Soft tissue masses in the left inguinal region most compatible with enlarged lymph nodes/adenopathy, the largest measuring up to 6.1 cm. 08/20/2018 Tissue-Flow Cytometry with monoclonal B-cell population identified.  08/20/2018 LN Biopsy revealed Left Inguinal with atypical lymphoid proliferation.  09/16/2018: Lymph node biopsy, left inguinal with low grade follicular lymphoma.   09/29/18 Hep B and Hep C negative 10/08/18 PET/CT revealed Left inguinal and external iliac hypermetabolic adenopathy, consistent with active lymphoma. ([place] 4). 2. No extrapelvic disease identified. 3. Coronary artery atherosclerosis. Aortic Atherosclerosis. 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 08/10/2019 CT Abdomen Pelvis Wo Contrast (Accession [id number]) which revealed "1. Interval response to therapy as evidenced by decrease in size of left external iliac and left inguinal adenopathy. 2. Bilateral renal stones. 3. Aortic atherosclerosis (ICD10-I70.0). 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  -no indication for additional rx for patient follicular lymphoma at this time.  FOLLOW UP: RTC with Dr Irene Limbo with labs in 6 months   The total time spent in the appt was 15 minutes and more than 50% was on counseling and direct patient cares.  All of the patient's questions were answered with apparent satisfaction. The patient knows to call the clinic with any problems, questions or concerns.    Sullivan Lone MD [place] AAHIVMS [facility] Hematology/Oncology Physician [facility]  (Office):       [phone] (Work cell):  [phone] (Fax):           [phone]  08/11/2020 2:20 AM  I, Yevette Edwards, am acting as a scribe for Dr. Sullivan Lone.   .I have reviewed the above documentation for accuracy and completeness, and I agree with the above. Brunetta Genera MD

## 2020-08-29 ENCOUNTER — Telehealth: Payer: Self-pay | Admitting: Family Medicine

## 2020-08-29 NOTE — Telephone Encounter (Signed)
Spoke with patient to schedule AWVS.  He stated he still has not received his color guard test and wanted to know when he would be receiving the test.

## 2020-09-05 ENCOUNTER — Ambulatory Visit (INDEPENDENT_AMBULATORY_CARE_PROVIDER_SITE_OTHER): Payer: Medicare Other

## 2020-09-05 ENCOUNTER — Other Ambulatory Visit: Payer: Self-pay

## 2020-09-05 DIAGNOSIS — Z0000 Encounter for general adult medical examination without abnormal findings: Secondary | ICD-10-CM | POA: Diagnosis not present

## 2020-09-05 DIAGNOSIS — Z1211 Encounter for screening for malignant neoplasm of colon: Secondary | ICD-10-CM | POA: Diagnosis not present

## 2020-09-05 NOTE — Patient Instructions (Addendum)
[name] , Thank you for taking time to come for your Medicare Wellness Visit. I appreciate your ongoing commitment to your health goals. Please review the following plan we discussed and let me know if I can assist you in the future.   Screening recommendations/referrals: Colonoscopy: Cologuard 07/25/17, order placed 09/05/20 for cologuard Recommended yearly ophthalmology/optometry visit for glaucoma screening and checkup Recommended yearly dental visit for hygiene and checkup  Vaccinations: Influenza vaccine: Done 1018/21 Up to date Pneumococcal vaccine: declined and discussed Tdap vaccine: Up to date Shingles vaccine: 1st dose 05/16/20   Covid-19: Booster 05/10/20 Please call back with dates for COVID vaccines to update immunization information  Advanced directives: Advance directive discussed with you today. Even though you declined this today please call our office should you change your mind and we can give you the proper paperwork for you to fill out.  Conditions/risks identified: None at this time  Next appointment: Follow up in one year for your annual wellness visit.   Preventive Care 14 Years and Older, Male Preventive care refers to lifestyle choices and visits with your health care provider that can promote health and wellness. What does preventive care include?  A yearly physical exam. This is also called an annual well check.  Dental exams once or twice a year.  Routine eye exams. Ask your health care provider how often you should have your eyes checked.  Personal lifestyle choices, including:  Daily care of your teeth and gums.  Regular physical activity.  Eating a healthy diet.  Avoiding tobacco and drug use.  Limiting alcohol use.  Practicing safe sex.  Taking low doses of aspirin every day.  Taking vitamin and mineral supplements as recommended by your health care provider. What happens during an annual well check? The services and screenings done  by your health care provider during your annual well check will depend on your age, overall health, lifestyle risk factors, and family history of disease. Counseling  Your health care provider may ask you questions about your:  Alcohol use.  Tobacco use.  Drug use.  Emotional well-being.  Home and relationship well-being.  Sexual activity.  Eating habits.  History of falls.  Memory and ability to understand (cognition).  Work and work Statistician. Screening  You may have the following tests or measurements:  Height, weight, and BMI.  Blood pressure.  Lipid and cholesterol levels. These may be checked every 5 years, or more frequently if you are over 35 years old.  Skin check.  Lung cancer screening. You may have this screening every year starting at age 5 if you have a 30-pack-year history of smoking and currently smoke or have quit within the past 15 years.  Fecal occult blood test (FOBT) of the stool. You may have this test every year starting at age 10.  Flexible sigmoidoscopy or colonoscopy. You may have a sigmoidoscopy every 5 years or a colonoscopy every 10 years starting at age 22.  Prostate cancer screening. Recommendations will vary depending on your family history and other risks.  Hepatitis C blood test.  Hepatitis B blood test.  Sexually transmitted disease (STD) testing.  Diabetes screening. This is done by checking your blood sugar (glucose) after you have not eaten for a while (fasting). You may have this done every 1-3 years.  Abdominal aortic aneurysm (AAA) screening. You may need this if you are a current or former smoker.  Osteoporosis. You may be screened starting at age 41 if you are at high  risk. Talk with your health care provider about your test results, treatment options, and if necessary, the need for more tests. Vaccines  Your health care provider may recommend certain vaccines, such as:  Influenza vaccine. This is recommended every  year.  Tetanus, diphtheria, and acellular pertussis (Tdap, Td) vaccine. You may need a Td booster every 10 years.  Zoster vaccine. You may need this after age 52.  Pneumococcal 13-valent conjugate (PCV13) vaccine. One dose is recommended after age 26.  Pneumococcal polysaccharide (PPSV23) vaccine. One dose is recommended after age 89. Talk to your health care provider about which screenings and vaccines you need and how often you need them. This information is not intended to replace advice given to you by your health care provider. Make sure you discuss any questions you have with your health care provider. Document Released: 08/12/2015 Document Revised: 04/04/2016 Document Reviewed: 05/17/2015 Elsevier Interactive Patient Education  2017 [employer] Prevention in the Home Falls can cause injuries. They can happen to people of all ages. There are many things you can do to make your home safe and to help prevent falls. What can I do on the outside of my home?  Regularly fix the edges of walkways and driveways and fix any cracks.  Remove anything that might make you trip as you walk through a door, such as a raised step or threshold.  Trim any bushes or trees on the path to your home.  Use bright outdoor lighting.  Clear any walking paths of anything that might make someone trip, such as rocks or tools.  Regularly check to see if handrails are loose or broken. Make sure that both sides of any steps have handrails.  Any raised decks and porches should have guardrails on the edges.  Have any leaves, snow, or ice cleared regularly.  Use sand or salt on walking paths during winter.  Clean up any spills in your garage right away. This includes oil or grease spills. What can I do in the bathroom?  Use night lights.  Install grab bars by the toilet and in the tub and shower. Do not use towel bars as grab bars.  Use non-skid mats or decals in the tub or shower.  If you  need to sit down in the shower, use a plastic, non-slip stool.  Keep the floor dry. Clean up any water that spills on the floor as soon as it happens.  Remove soap buildup in the tub or shower regularly.  Attach bath mats securely with double-sided non-slip rug tape.  Do not have throw rugs and other things on the floor that can make you trip. What can I do in the bedroom?  Use night lights.  Make sure that you have a light by your bed that is easy to reach.  Do not use any sheets or blankets that are too big for your bed. They should not hang down onto the floor.  Have a firm chair that has side arms. You can use this for support while you get dressed.  Do not have throw rugs and other things on the floor that can make you trip. What can I do in the kitchen?  Clean up any spills right away.  Avoid walking on wet floors.  Keep items that you use a lot in easy-to-reach places.  If you need to reach something above you, use a strong step stool that has a grab bar.  Keep electrical cords out of the way.  Do not use floor polish or wax that makes floors slippery. If you must use wax, use non-skid floor wax.  Do not have throw rugs and other things on the floor that can make you trip. What can I do with my stairs?  Do not leave any items on the stairs.  Make sure that there are handrails on both sides of the stairs and use them. Fix handrails that are broken or loose. Make sure that handrails are as long as the stairways.  Check any carpeting to make sure that it is firmly attached to the stairs. Fix any carpet that is loose or worn.  Avoid having throw rugs at the top or bottom of the stairs. If you do have throw rugs, attach them to the floor with carpet tape.  Make sure that you have a light switch at the top of the stairs and the bottom of the stairs. If you do not have them, ask someone to add them for you. What else can I do to help prevent falls?  Wear shoes  that:  Do not have high heels.  Have rubber bottoms.  Are comfortable and fit you well.  Are closed at the toe. Do not wear sandals.  If you use a stepladder:  Make sure that it is fully opened. Do not climb a closed stepladder.  Make sure that both sides of the stepladder are locked into place.  Ask someone to hold it for you, if possible.  Clearly mark and make sure that you can see:  Any grab bars or handrails.  First and last steps.  Where the edge of each step is.  Use tools that help you move around (mobility aids) if they are needed. These include:  Canes.  Walkers.  Scooters.  Crutches.  Turn on the lights when you go into a dark area. Replace any light bulbs as soon as they burn out.  Set up your furniture so you have a clear path. Avoid moving your furniture around.  If any of your floors are uneven, fix them.  If there are any pets around you, be aware of where they are.  Review your medicines with your doctor. Some medicines can make you feel dizzy. This can increase your chance of falling. Ask your doctor what other things that you can do to help prevent falls. This information is not intended to replace advice given to you by your health care provider. Make sure you discuss any questions you have with your health care provider. Document Released: 05/12/2009 Document Revised: 12/22/2015 Document Reviewed: 08/20/2014 Elsevier Interactive Patient Education  2017 [employer].

## 2020-09-05 NOTE — Progress Notes (Signed)
Virtual Visit via Telephone Note  I connected with  Richard [name] on 09/05/20 at  1:00 PM EST by telephone and verified that I am speaking with the correct person using two identifiers.  Medicare Annual Wellness visit completed telephonically due to Covid-19 pandemic.   Persons participating in this call: This Health Coach and this patient.   Location: Patient: Home Provider: Office   I discussed the limitations, risks, security and privacy concerns of performing an evaluation and management service by telephone and the availability of in person appointments. The patient expressed understanding and agreed to proceed.  Unable to perform video visit due to video visit attempted and failed and/or patient does not have video capability.   Some vital signs may be absent or patient reported.   Willette Brace, LPN    Subjective:   Richard [name] is a 75 y.o. male who presents for Medicare Annual/Subsequent preventive examination.  Review of Systems     Cardiac Risk Factors include: advanced age (>50men, >74 women);dyslipidemia;male gender;hypertension     Objective:    Today's Vitals   09/05/20 1252  PainSc: 1    There is no height or weight on file to calculate BMI.  Advanced Directives 09/05/2020 02/10/2020 08/13/2019 04/24/2019 12/16/2018 09/08/2018 08/20/2018  Does Patient Have a Medical Advance Directive? No No No Yes No No No  Type of Advance Directive - - - Living will;Healthcare Power of Attorney - - -  Does patient want to make changes to medical advance directive? - - - No - Patient declined - - -  Copy of [place] in Chart? - - - No - copy requested - - -  Would patient like information on creating a medical advance directive? No - Patient declined No - Patient declined No - Patient declined - No - Patient declined No - Patient declined No - Patient declined  Pre-existing out of facility DNR order (yellow form or pink MOST form) - - - - - - -     Current Medications (verified) Outpatient Encounter Medications as of 09/05/2020  Medication Sig  . amLODipine (NORVASC) 5 MG tablet TAKE 1 TABLET BY MOUTH EVERY DAY  . atorvastatin (LIPITOR) 20 MG tablet TAKE 1 TABLET BY MOUTH ON MON,WED, FRI, AND SUN  . ferrous sulfate 324 MG TBEC Take 324 mg by mouth.  Marland Kitchen HYDROcodone-acetaminophen (NORCO/VICODIN) 5-325 MG tablet Take 1 tablet by mouth every 6 (six) hours as needed for moderate pain.  . metoprolol tartrate (LOPRESSOR) 25 MG tablet TAKE 0.5 TABLETS (12.5 MG TOTAL) BY MOUTH 2 (TWO) TIMES DAILY.  . tamsulosin (FLOMAX) 0.4 MG CAPS capsule TAKE 1 CAPSULE BY MOUTH EVERYDAY AT BEDTIME  . baclofen (LIORESAL) 10 MG tablet TAKE 1 TABLET BY MOUTH THREE TIMES A DAY AS NEEDED FOR MUSCLE SPASMS (Patient not taking: Reported on 09/05/2020)   No facility-administered encounter medications on file as of 09/05/2020.    Allergies (verified) Angiotensin receptor blockers, Ciprofloxacin, and Pyridium [phenazopyridine]   History: Past Medical History:  Diagnosis Date  . Aneurysm ([place])    left common iliac-stable  . Arthritis   . Fasting hyperglycemia   . Follicular lymphoma ([place]) dx'd 07/2018  . History of kidney stones   . Hyperlipidemia    LDL goal = < 100 based on NMR Lipoproprofile  . Hypertension   . Mass of left lower extremity    inner thigh  . Nephrolithiasis    left   . Renal insufficiency   . Sepsis (  [place]) 03/24/2013  . Squamous cell carcinoma, face    Dr Sarajane Jews  . Ureteral stone with hydronephrosis 03/24/2013   left   Past Surgical History:  Procedure Laterality Date  . CATARACT EXTRACTION  2005   OD   . COLONOSCOPY W/ POLYPECTOMY  2007   adenoma X2 ; [facility]  . CYSTOSCOPY W/ URETERAL STENT PLACEMENT Left 03/23/2013   Procedure: CYSTOSCOPY WITH RETROGRADE PYELOGRAM/URETERAL STENT PLACEMENT;  Surgeon: Fredricka Bonine, MD;  Location: [facility];  Service: Urology;  Laterality: Left;  . CYSTOSCOPY WITH RETROGRADE PYELOGRAM,  URETEROSCOPY AND STENT PLACEMENT Bilateral 07/15/2018   Procedure: CYSTOSCOPY WITH RETROGRADE PYELOGRAM, URETEROSCOPY;  Surgeon: Irine Seal, MD;  Location: [facility];  Service: Urology;  Laterality: Bilateral;  . CYSTOSCOPY WITH STENT PLACEMENT Left 07/15/2018   Procedure: CYSTOSCOPY WITH STENT PLACEMENT;  Surgeon: Irine Seal, MD;  Location: [facility];  Service: Urology;  Laterality: Left;  . CYSTOSCOPY WITH URETEROSCOPY  02/28/2012   Procedure: CYSTOSCOPY WITH URETEROSCOPY;  Surgeon: Malka So, MD;  Location: [facility];  Service: Urology;  Laterality: Left;  . CYSTOSCOPY/URETEROSCOPY/HOLMIUM LASER/STENT PLACEMENT Right 07/31/2018   Procedure: CYSTOSCOPY STENT REMOVAL  RIGHT URETEROSCOPY WITH HOLMIUM LASER POSSIBLE STENT PLACEMENT;  Surgeon: Irine Seal, MD;  [facility];  Service: Urology;  Laterality: Right;  . HOLMIUM LASER APPLICATION Left [id number]   Procedure: HOLMIUM LASER APPLICATION;  Surgeon: Malka So, MD;  Location: [facility];  Service: Urology;  Laterality: Left;  . HOLMIUM LASER APPLICATION Right [id number]   Procedure: HOLMIUM LASER APPLICATION;  Surgeon: Irine Seal, MD;  Location: [facility];  Service: Urology;  Laterality: Right;  . INGUINAL LYMPH NODE BIOPSY Left 09/16/2018   Procedure: LEFT INGUINAL LYMPH NODE BIOPSY;  Surgeon: Erroll Luna, MD;  Location: [facility];  Service: General;  Laterality: Left;  . LITHOTRIPSY      X2 w/o benefit  . NEPHROLITHOTOMY  02/28/2012   Procedure: NEPHROLITHOTOMY PERCUTANEOUS;  Surgeon: Malka So, MD;  Location: [facility];  Service: Urology;  Laterality: Left;  . TONSILLECTOMY    . URETEROSCOPY Left 03/31/2013   Procedure: URETEROSCOPY;  Surgeon: Malka So, MD;  Location: [facility];  Service: Urology;  Laterality: Left;  Marland Kitchen VASECTOMY     Family History  Problem Relation Age of Onset  . Heart attack Father 63        CBAG X5 vessels  . Hyperlipidemia Father   . Hypertension Father   . Heart disease Father         before age 41  . Hypertension Brother   . Aortic aneurysm Paternal Uncle        AAA ruptured  . Heart disease Mother   . Hyperlipidemia Mother   . Hypertension Mother   . Diabetes Neg Hx    Social History   Socioeconomic History  . Marital status: Married    Spouse name: Not on file  . Number of children: 2  . Years of education: 85  . Highest education level: Not on file  Occupational History  . Occupation: Facilities manager (retired)    Fish farm manager: [employer]  Tobacco Use  . Smoking status: Never Smoker  . Smokeless tobacco: Never Used  Vaping Use  . Vaping Use: Never used  Substance and Sexual Activity  . Alcohol use: Yes    Comment:  rarely  . Drug use: No  . Sexual activity: Not on file  Other Topics Concern  . Not on file  Social History  Narrative   Fun: Work around [employer] and work out.    Social Determinants of Health   Financial Resource Strain: Low Risk   . Difficulty of Paying Living Expenses: Not hard at all  Food Insecurity: No Food Insecurity  . Worried About Charity fundraiser in the Last Year: Never true  . Ran Out of Food in the Last Year: Never true  Transportation Needs: No Transportation Needs  . Lack of Transportation (Medical): No  . Lack of Transportation (Non-Medical): No  Physical Activity: Sufficiently Active  . Days of Exercise per Week: 5 days  . Minutes of Exercise per Session: 30 min  Stress: No Stress Concern Present  . Feeling of Stress : Not at all  Social Connections: Moderately Isolated  . Frequency of Communication with Friends and Family: Three times a week  . Frequency of Social Gatherings with Friends and Family: Once a week  . Attends Religious Services: Never  . Active Member of Clubs or Organizations: No  . Attends Archivist Meetings: Never  . Marital Status: Married    Tobacco Counseling Counseling given: Not Answered   Clinical Intake:  Pre-visit preparation completed: Yes  Pain : 0-10  (kidney stone problem) Pain Score: 1  Pain Type: Chronic pain,Acute pain Pain Onset: In the past 7 days Pain Frequency: Occasional     BMI - recorded: 25.6 Nutritional Status: BMI 25 -29 Overweight Nutritional Risks: None Diabetes: No  How often do you need to have someone help you when you read instructions, pamphlets, or other written materials from your doctor or pharmacy?: 1 - Never  Diabetic?No  Interpreter Needed?: No  Information entered by :: Charlott Rakes, LPN   Activities of Daily Living In your present state of health, do you have any difficulty performing the following activities: 09/05/2020 07/05/2020  Hearing? N N  Vision? N N  Difficulty concentrating or making decisions? N Y  Walking or climbing stairs? N N  Dressing or bathing? N N  Doing errands, shopping? N N  Preparing Food and eating ? N -  Using the Toilet? N -  In the past six months, have you accidently leaked urine? N -  Do you have problems with loss of bowel control? N -  Managing your Medications? N -  Managing your Finances? N -  Housekeeping or managing your Housekeeping? N -  Some recent data might be hidden    Patient Care Team: Vivi Barrack, MD as PCP - General (Family Medicine) Brunetta Genera, MD as Consulting Physician (Hematology) Irine Seal, MD as Attending Physician (Urology) Dyke Maes, OD as Consulting Physician (Optometry)  Indicate any recent Medical Services you may have received from other than [facility] providers in the past year (date may be approximate).     Assessment:   This is a routine wellness examination for Richard [name].  Hearing/Vision screen  Hearing Screening   125Hz  250Hz  500Hz  1000Hz  2000Hz  3000Hz  4000Hz  6000Hz  8000Hz   Right ear:           Left ear:           Comments: Pt denies any hearing complaints of tinnitus at times  Vision Screening Comments: Pt had an appt for eye exams last year will need to find a new provider  Dietary issues and  exercise activities discussed: Current Exercise Habits: Home exercise routine, Type of exercise: treadmill, Time (Minutes): 30, Frequency (Times/Week): 5, Weekly Exercise (Minutes/Week): 150  Goals    . Patient Stated  None at this time      Depression Screen PHQ 2/9 Scores 09/05/2020 07/05/2020 04/24/2019 06/17/2018 06/26/2017 05/15/2013 02/28/2012  PHQ - 2 Score 0 0 0 0 0 0 0    Fall Risk Fall Risk  09/05/2020 07/05/2020 04/24/2019 06/26/2017 05/15/2013  Falls in the past year? 0 0 0 No No  Number falls in past yr: 0 - 0 - -  Injury with Fall? 0 - 0 - -  Risk for fall due to : Impaired vision - - - -  Follow up Falls prevention discussed - Falls prevention discussed;Education provided;Falls evaluation completed - -    FALL RISK PREVENTION PERTAINING TO THE HOME:  Any stairs in or around the home? Yes  If so, are there any without handrails? No  Home free of loose throw rugs in walkways, pet beds, electrical cords, etc? Yes  Adequate lighting in your home to reduce risk of falls? Yes   ASSISTIVE DEVICES UTILIZED TO PREVENT FALLS:  Life alert? No  Use of a cane, walker or w/c? No  Grab bars in the bathroom? No  Shower chair or bench in shower? Yes  Elevated toilet seat or a handicapped toilet? Yes   TIMED UP AND GO:  Was the test performed? No     Cognitive Function:     6CIT Screen 09/05/2020  What Year? 0 points  What month? 0 points  Count back from 20 0 points  Months in reverse 0 points  Repeat phrase 0 points    Immunizations Immunization History  Administered Date(s) Administered  . Influenza Whole 04/29/2012  . Influenza, High Dose Seasonal PF 05/15/2013, 05/24/2016, 06/04/2017, 05/26/2018, 04/24/2019  . Influenza,inj,Quad PF,6+ Mos 07/04/2015  . Influenza,inj,quad, With Preservative 05/16/2020  . Influenza-Unspecified 04/24/2019  . PFIZER(Purple Top)SARS-COV-2 Vaccination 05/10/2020  . Tdap 07/04/2015  . Zoster Recombinat (Shingrix) 05/16/2020     TDAP status: Up to date  Flu Vaccine status: Up to date done 05/16/20  Pneumococcal vaccine status: Declined,  Education has been provided regarding the importance of this vaccine but patient still declined. Advised may receive this vaccine at local pharmacy or Health Dept. Aware to provide a copy of the vaccination record if obtained from local pharmacy or Health Dept. Verbalized acceptance and understanding.   Covid-19 vaccine status: Completed vaccines per pt states he will call in with other dates 05/10/20 was booster  Qualifies for Shingles Vaccine? Yes   Zostavax completed Yes   Shingrix Completed?: Yes 1st dose 05/16/20  Screening Tests Health Maintenance  Topic Date Due  . PNA vac Low Risk Adult (1 of 2 - PCV13) Never done  . COVID-19 Vaccine (2 - Pfizer risk 4-dose series) 05/31/2020  . Fecal DNA (Cologuard)  07/25/2020  . TETANUS/TDAP  07/03/2025  . INFLUENZA VACCINE  Completed  . Hepatitis C Screening  Completed    Health Maintenance  Health Maintenance Due  Topic Date Due  . PNA vac Low Risk Adult (1 of 2 - PCV13) Never done  . COVID-19 Vaccine (2 - Pfizer risk 4-dose series) 05/31/2020  . Fecal DNA (Cologuard)  07/25/2020    Colorectal cancer screening: Type of screening: Cologuard. Completed 07/25/17. Repeat every 3 years  Additional Screening:  Hepatitis C Screening:  Completed 09/29/18  Vision Screening: Recommended annual ophthalmology exams for early detection of glaucoma and other disorders of the eye. Is the patient up to date with their annual eye exam?  Yes  Who is the provider or what is the name of the office  in which the patient attends annual eye exams? Pt states his provider retired and he will follow up to make appt in the future last exam was in2021  If pt is not established with a provider, would they like to be referred to a provider to establish care? No .   Dental Screening: Recommended annual dental exams for proper oral  hygiene  Community Resource Referral / Chronic Care Management: CRR required this visit?  No   CCM required this visit?  No      Plan:     I have personally reviewed and noted the following in the patient's chart:   . Medical and social history . Use of alcohol, tobacco or illicit drugs  . Current medications and supplements . Functional ability and status . Nutritional status . Physical activity . Advanced directives . List of other physicians . Hospitalizations, surgeries, and ER visits in previous 12 months . Vitals . Screenings to include cognitive, depression, and falls . Referrals and appointments  In addition, I have reviewed and discussed with patient certain preventive protocols, quality metrics, and best practice recommendations. A written personalized care plan for preventive services as well as general preventive health recommendations were provided to patient.     Willette Brace, LPN   [id number]   Nurse Notes: None

## 2020-10-26 NOTE — Telephone Encounter (Signed)
Pt called following up on this message. He still has not heard anything about a cologuard test. Please advise.

## 2020-10-26 NOTE — Telephone Encounter (Signed)
See below

## 2020-10-27 ENCOUNTER — Telehealth: Payer: Self-pay

## 2020-10-27 NOTE — Telephone Encounter (Signed)
Order relaxed to lab

## 2020-10-27 NOTE — Telephone Encounter (Signed)
Pt notified Cologuard order was faxed this morning

## 2020-10-27 NOTE — Telephone Encounter (Signed)
Patient is calling in stating that he spoke with someone yesterday about his cologuard test. He states that he still yet to receive it and has been told many times since he seen Dr.Parker last that we will be sending it but no one has fallen through.

## 2020-11-10 ENCOUNTER — Telehealth: Payer: Self-pay | Admitting: Family Medicine

## 2020-11-10 NOTE — Chronic Care Management (AMB) (Signed)
  Chronic Care Management   Outreach Note  11/10/2020 Name: Richard [name] MRN: [id number] DOB: 26-Feb-1946  Referred by: Vivi Barrack, MD Reason for referral : No chief complaint on file.   An unsuccessful telephone outreach was attempted today. The patient was referred to the pharmacist for assistance with care management and care coordination.   Follow Up Plan:   Lauretta Grill Upstream Scheduler

## 2020-11-15 DIAGNOSIS — Z1211 Encounter for screening for malignant neoplasm of colon: Secondary | ICD-10-CM | POA: Diagnosis not present

## 2020-11-16 LAB — COLOGUARD: Cologuard: NEGATIVE

## 2020-11-20 LAB — COLOGUARD: COLOGUARD: NEGATIVE

## 2020-11-21 ENCOUNTER — Encounter: Payer: Self-pay | Admitting: Family Medicine

## 2020-11-29 ENCOUNTER — Telehealth: Payer: Self-pay | Admitting: Family Medicine

## 2020-11-29 NOTE — Chronic Care Management (AMB) (Signed)
  Chronic Care Management   Note  11/29/2020 Name: ORMAN MATSUMURA MRN: [id number] DOB: 02/21/1946  KEANDRE LINDEN is a 76 y.o. year old male who is a primary care patient of Vivi Barrack, MD. I reached out to Aleen Sells by phone today in response to a referral sent by Mr. Gurley Climer Nodal's PCP, Vivi Barrack, MD.   Mr. Bramhall was given information about Chronic Care Management services today including:  1. CCM service includes personalized support from designated clinical staff supervised by his physician, including individualized plan of care and coordination with other care providers 2. 24/7 contact phone numbers for assistance for urgent and routine care needs. 3. Service will only be billed when office clinical staff spend 20 minutes or more in a month to coordinate care. 4. Only one practitioner may furnish and bill the service in a calendar month. 5. The patient may stop CCM services at any time (effective at the end of the month) by phone call to the office staff.   Patient agreed to services and verbal consent obtained.   Follow up plan:   Lauretta Grill Upstream Scheduler

## 2020-11-29 NOTE — Chronic Care Management (AMB) (Signed)
  Chronic Care Management   Outreach Note  11/29/2020 Name: Richard [name] MRN: [id number] DOB: 1946/07/09  Referred by: Vivi Barrack, MD Reason for referral : No chief complaint on file.   A second unsuccessful telephone outreach was attempted today. The patient was referred to pharmacist for assistance with care management and care coordination.  Follow Up Plan:   Lauretta Grill Upstream Scheduler

## 2020-12-07 ENCOUNTER — Telehealth: Payer: Self-pay

## 2020-12-07 NOTE — Chronic Care Management (AMB) (Signed)
    Chronic Care Management Pharmacy Assistant   Name: Richard [name]  MRN: [id number] DOB: 11-29-1945  Richard [name] is an 75 y.o. year old male who presents for his initial CCM visit with the clinical pharmacist.  Reason for Encounter: Chart Prep   Recent office visits:  07/05/20- Dimas Chyle, MD-  Annual physical, ordered cologaurd test, follow up 1 yr 10/28/20-  Cologaurd test done  Recent consult visits:  08/11/20- Fabienne Bruns, MD (Oncology)- F/u for follicular lymphoma, no medication changes, follow up 6 months   Hospital visits:  None in previous 6 months  Medications: Outpatient Encounter Medications as of 12/07/2020  Medication Sig  . amLODipine (NORVASC) 5 MG tablet TAKE 1 TABLET BY MOUTH EVERY DAY  . atorvastatin (LIPITOR) 20 MG tablet TAKE 1 TABLET BY MOUTH ON MON,WED, FRI, AND SUN  . baclofen (LIORESAL) 10 MG tablet TAKE 1 TABLET BY MOUTH THREE TIMES A DAY AS NEEDED FOR MUSCLE SPASMS (Patient not taking: Reported on 09/05/2020)  . ferrous sulfate 324 MG TBEC Take 324 mg by mouth.  Marland Kitchen HYDROcodone-acetaminophen (NORCO/VICODIN) 5-325 MG tablet Take 1 tablet by mouth every 6 (six) hours as needed for moderate pain.  . metoprolol tartrate (LOPRESSOR) 25 MG tablet TAKE 0.5 TABLETS (12.5 MG TOTAL) BY MOUTH 2 (TWO) TIMES DAILY.  . tamsulosin (FLOMAX) 0.4 MG CAPS capsule TAKE 1 CAPSULE BY MOUTH EVERYDAY AT BEDTIME   No facility-administered encounter medications on file as of 12/07/2020.   Unable to reach patient prior to initial visit  Current Documented Medications Amlodipine 5 mg- 90 DS last filled 11/28/20 Atorvastatin 20 mg-90 DS last filled 08/27/20 Baclofen 10 mg- 10 DS last filled 07/05/20 Ferrous Sulfate 324 mg - Norco/Vicodin 5-325 mg- Metoprolo Tartrate 25 mg- 90 DS last filled 10/22/20 Tamsulosin 0.4 mg- 90 DS last filled 07/01/20  Have you seen any other providers since your last visit?    Any changes in your medications or health?    Any side effects  from any medications?   Do you have an symptoms or problems not managed by your medications?    Any concerns about your health right now?   Has your provider asked that you check blood pressure, blood sugar, or follow special diet at home?   Do you get any type of exercise on a regular basis?    Can you think of a goal you would like to reach for your health?   Do you have any problems getting your medications?    Is there anything that you would like to discuss during the appointment?    Please bring medications and supplements to appointment Reminded patient of initial visit with CPP on 05/16 at 9 am.  Wilford Sports CPA,CMA

## 2020-12-07 NOTE — Progress Notes (Signed)
Chronic Care Management Pharmacy Note  12/07/2020 Name:  Richard [name] MRN:  [id number] DOB:  02-11-1946  Subjective: Richard [name] is an 75 y.o. year old male who is a primary patient of Jerline Pain, Algis Greenhouse, MD.  The CCM team was consulted for assistance with disease management and care coordination needs.    Engaged with patient face to face for initial visit in response to provider referral for pharmacy case management and/or care coordination services.   Consent to Services:  The patient was given information about Chronic Care Management services, agreed to services, and gave verbal consent prior to initiation of services.  Please see initial visit note for detailed documentation.   Patient Care Team: Vivi Barrack, MD as PCP - General (Family Medicine) Brunetta Genera, MD as Consulting Physician (Hematology) Irine Seal, MD as Attending Physician (Urology) Dyke Maes, OD as Consulting Physician (Optometry) Madelin Rear, [facility] as Pharmacist (Pharmacist)  Recent office visits:  07/05/20- Dimas Chyle, MD-  Annual physical, ordered cologaurd test, follow up 1 yr 10/28/20-  Cologaurd test done   Recent consult visits:  08/11/20- Fabienne Bruns, MD (Oncology)- F/u for follicular lymphoma, no medication changes, follow up 6 months   Hospital visits:  None in previous 6 months  Objective:  Lab Results  Component Value Date   CREATININE 1.43 (H) 08/11/2020   CREATININE 1.24 (H) 07/05/2020   CREATININE 1.47 (H) 02/10/2020    Lab Results  Component Value Date   HGBA1C 5.4 07/05/2020   Last diabetic Eye exam: No results found for: HMDIABEYEEXA  Last diabetic Foot exam: No results found for: HMDIABFOOTEX      Component Value Date/Time   CHOL 155 07/05/2020 1106   CHOL 158 05/24/2014 1220   TRIG 58 07/05/2020 1106   TRIG 78 05/24/2014 1220   HDL 71 07/05/2020 1106   HDL 63 05/24/2014 1220   CHOLHDL 2.2 07/05/2020 1106   VLDL 16.8 05/11/2019 1039   LDLCALC 71  07/05/2020 1106   LDLCALC 79 05/24/2014 1220   LDLDIRECT 91.0 06/26/2017 1453    Hepatic Function Latest Ref Rng & Units 08/11/2020 07/05/2020 02/10/2020  Total Protein 6.5 - 8.1 g/dL 6.2(L) 6.4 6.5  Albumin 3.5 - 5.0 g/dL 3.9 - 4.1  AST 15 - 41 U/L '17 18 25  ' ALT 0 - 44 U/L '19 19 24  ' Alk Phosphatase 38 - 126 U/L 55 - 72  Total Bilirubin 0.3 - 1.2 mg/dL 0.8 0.8 0.8  Bilirubin, Direct 0.0 - 0.3 mg/dL - - -    Lab Results  Component Value Date/Time   TSH 3.65 07/05/2020 11:06 AM   TSH 3.50 05/11/2019 10:39 AM    CBC Latest Ref Rng & Units 08/11/2020 07/05/2020 02/10/2020  WBC 4.0 - 10.5 K/uL 6.1 4.9 6.2  Hemoglobin 13.0 - 17.0 g/dL 14.2 14.9 14.1  Hematocrit 39.0 - 52.0 % 44.6 45.3 43.8  Platelets 150 - 400 K/uL 131(L) 159 167    No results found for: VD25OH  Clinical ASCVD:  The 10-year ASCVD risk score Mikey Bussing DC Jr., et al., 2013) is: 22.6%   Values used to calculate the score:     Age: 71 years     Sex: Male     Is Non-Hispanic African American: No     Diabetic: No     Tobacco smoker: No     Systolic Blood Pressure: 633 mmHg     Is BP treated: Yes     HDL Cholesterol: 71 mg/dL  Total Cholesterol: 155 mg/dL    Social History   Tobacco Use  Smoking Status Never Smoker  Smokeless Tobacco Never Used   BP Readings from Last 3 Encounters:  08/11/20 137/81  07/05/20 128/75  02/10/20 130/83   Pulse Readings from Last 3 Encounters:  07/05/20 (!) 52  02/10/20 66  08/13/19 65   Wt Readings from Last 3 Encounters:  08/11/20 194 lb (88 kg)  07/05/20 194 lb (88 kg)  02/10/20 191 lb (86.6 kg)   Assessment: Review of patient past medical history, allergies, medications, health status, including review of consultants reports, laboratory and other test data, was performed as part of comprehensive evaluation and provision of chronic care management services.   SDOH:  (Social Determinants of Health) assessments and interventions performed: Yes.  CCM Care Plan  Allergies   Allergen Reactions  . Angiotensin Receptor Blockers     Angioedema with ACE-I  . Ciprofloxacin     Rash on combination of Cipro & Pyridium after cystoscopic & open resection of renal calculi  . Pyridium [Phenazopyridine]     In combo with Cipro    Medications Reviewed Today    Reviewed by Willette Brace, LPN (Licensed Practical Nurse) on 09/05/20 at 40  Med List Status: <None>  Medication Order Taking? Sig Documenting Provider Last Dose Status Informant  amLODipine (NORVASC) 5 MG tablet [id number] Yes TAKE 1 TABLET BY MOUTH EVERY DAY Vivi Barrack, MD Taking Active   atorvastatin (LIPITOR) 20 MG tablet [id number] Yes TAKE 1 TABLET BY MOUTH ON MON,WED, FRI, AND SUN Vivi Barrack, MD Taking Active   baclofen (LIORESAL) 10 MG tablet [id number] No TAKE 1 TABLET BY MOUTH THREE TIMES A DAY AS NEEDED FOR MUSCLE SPASMS  Patient not taking: Reported on 09/05/2020   Vivi Barrack, MD Not Taking Active   ferrous sulfate 324 MG TBEC [id number] Yes Take 324 mg by mouth. [provider] Taking Active   HYDROcodone-acetaminophen (NORCO/VICODIN) 5-325 MG tablet [id number] Yes Take 1 tablet by mouth every 6 (six) hours as needed for moderate pain. Erroll Luna, MD Taking Active   metoprolol tartrate (LOPRESSOR) 25 MG tablet [id number] Yes TAKE 0.5 TABLETS (12.5 MG TOTAL) BY MOUTH 2 (TWO) TIMES DAILY. Vivi Barrack, MD Taking Active   tamsulosin [facility]) 0.4 MG CAPS capsule [id number] Yes TAKE 1 CAPSULE BY MOUTH EVERYDAY AT BEDTIME Vivi Barrack, MD Taking Active           Patient Active Problem List   Diagnosis Date Noted  . Low testosterone 07/05/2020  . CKD (chronic kidney disease) stage 3, GFR 30-59 ml/min (HCC) 05/11/2019  . Restless legs syndrome 05/11/2019  . Intermittent low back pain 05/11/2019  . Follicular lymphoma grade I of intrapelvic lymph nodes ([place]) 12/16/2018  . Erectile dysfunction 06/26/2017  . Common iliac aneurysm ([place]) 08/01/2011  . ADENOMATOUS COLONIC  POLYP 03/08/2009  . Fasting hyperglycemia 03/08/2009  . HYPERLIPIDEMIA 09/02/2007  . Essential hypertension 09/02/2007  . NEPHROLITHIASIS, HX OF 09/02/2007    Immunization History  Administered Date(s) Administered  . Influenza Whole 04/29/2012  . Influenza, High Dose Seasonal PF 05/15/2013, 05/24/2016, 06/04/2017, 05/26/2018, 04/24/2019  . Influenza,inj,Quad PF,6+ Mos 07/04/2015  . Influenza,inj,quad, With Preservative 05/16/2020  . Influenza-Unspecified 04/24/2019  . PFIZER(Purple Top)SARS-COV-2 Vaccination 05/10/2020  . Tdap 07/04/2015  . Zoster Recombinat (Shingrix) 05/16/2020    Conditions to be addressed/monitored: CKD 3,HLD, HTN, Hyperglycemia, ED, Low Testosterone, RLS   Care Plan : General Pharmacy (Adult)  Updates made by Madelin Rear,  [place] since 12/12/2020 12:00 AM    Problem: CHL AMB "PATIENT-SPECIFIC PROBLEM"     Long-Range Goal: Disease Management   Start Date: 12/12/2020  Expected End Date: 12/12/2021  This Visit's Progress: On track  Priority: High  Note:   Current Barriers:  . Wanting to be seen/referred for low testosterone, potential future dose reduction in amlodipine  Pharmacist Clinical Goal(s):  Marland Kitchen Patient will contact provider office for questions/concerns as evidenced notation of same in electronic health record through collaboration with PharmD and provider.   Interventions: . 1:1 collaboration with Vivi Barrack, MD regarding development and update of comprehensive plan of care as evidenced by provider attestation and co-signature . Inter-disciplinary care team collaboration (see longitudinal plan of care) . Comprehensive medication review performed; medication list updated in electronic medical record . No Rx changes   Hypertension (BP goal <130/80) -Controlled -Current treatment: . Amlodipine 5 mg once daily  . Metoprolol tartrate 25 mg tablet - half tab (12.5 mg) twice daily  -Medications previously tried: angioedema with ACEi  -Current  home readings: none provided  -Current dietary habits: protein shakes, nuts, fruits. Does have Exercise: strength training/cardio nearly every day -Reports previous hypotensive symptoms - will start checking BP more often to see if dose reduction would be reasonable. has not had recent symptoms. -Educated on Symptoms of hypotension and importance of maintaining adequate hydration; -Counseled to monitor BP at home daily over next 2 weeks, document, and provide log at future appointments -Recommended to continue current medication, BP call 2 weeks  Hyperlipidemia: (LDL goal < 70) -Controlled, near goal - LDL 71 on 07/05/2021 -Current treatment: . Atorvastatin 20 mg once daily QIW -Reviewed side effects - no problems noted -Medications previously tried: none  -Educated on Cholesterol goals;  -Recommended to continue current medication  Low tesosterone (Goal: testosterone correction) -Not ideally controlled -Last testosterone 180 on 04/2019 -Current treatment  . N/a  -Medications previously tried: has not been on replacement therapy   -Will communicate request for urology referral and prn hydrocodone for kidney stones   Patient Goals/Self-Care Activities . Patient will:  - take medications as prescribed target a minimum of 150 minutes of moderate intensity exercise weekly  Medication Assistance: None required.  Patient affirms current coverage meets needs.  Patient's preferred pharmacy is:  [facility], [place] [address] [place] [address] Phone: [phone] Fax: [phone]  Follow Up:  Patient agrees to Care Plan and Follow-up. Plan: RPh f/u call 6-8 months. BP call ~2 weeks   Future Appointments  Date Time Provider [place]  12/12/2020  9:00 AM [facility] CCM PHARMACIST [facility] PEC  02/02/2021  1:45 PM [facility] LAB [facility] None  02/02/2021  2:20 PM Brunetta Genera, MD [facility] None  09/11/2021 11:45 AM  [facility] [place], PharmD, CPP Clinical Pharmacist Practitioner  [facility]  [phone]

## 2020-12-12 ENCOUNTER — Ambulatory Visit (INDEPENDENT_AMBULATORY_CARE_PROVIDER_SITE_OTHER): Payer: Medicare Other

## 2020-12-12 DIAGNOSIS — E782 Mixed hyperlipidemia: Secondary | ICD-10-CM

## 2020-12-12 DIAGNOSIS — I10 Essential (primary) hypertension: Secondary | ICD-10-CM

## 2020-12-12 NOTE — Patient Instructions (Signed)
Mr. Russomanno,  Thank you for talking with me today. I have included our care plan/goals in the following pages.   Please review and call me at [phone] with any questions.  Thanks! Ellin Mayhew, PharmD, CPP Clinical Pharmacist Practitioner  [facility]  [phone]  Patient Care Plan: General Pharmacy (Adult)    Problem Identified: [facility] AMB "PATIENT-SPECIFIC PROBLEM"     Long-Range Goal: Disease Management   Start Date: 12/12/2020  Expected End Date: 12/12/2021  This Visit's Progress: On track  Priority: High  Note:   Current Barriers:  . Wanting to be seen/referred for low testosterone, potential future dose reduction in amlodipine  Pharmacist Clinical Goal(s):  Marland Kitchen Patient will contact provider office for questions/concerns as evidenced notation of same in electronic health record through collaboration with PharmD and provider.   Interventions: . 1:1 collaboration with Vivi Barrack, MD regarding development and update of comprehensive plan of care as evidenced by provider attestation and co-signature . Inter-disciplinary care team collaboration (see longitudinal plan of care) . Comprehensive medication review performed; medication list updated in electronic medical record . No Rx changes   Hypertension (BP goal <130/80) -Controlled -Current treatment: . Amlodipine 5 mg once daily  . Metoprolol tartrate 25 mg tablet - half tab (12.5 mg) twice daily  -Medications previously tried: angioedema with ACEi  -Current home readings: none provided  -Current dietary habits: protein shakes, nuts, fruits. Does have Exercise: strength training/cardio nearly every day -Reports previous hypotensive symptoms - will start checking BP more often to see if dose reduction would be reasonable. has not had recent symptoms. -Educated on Symptoms of hypotension and importance of maintaining adequate hydration; -Counseled to monitor BP at home daily over next 2 weeks,  document, and provide log at future appointments -Recommended to continue current medication, BP call 2 weeks  Hyperlipidemia: (LDL goal < 70) -Controlled, near goal - LDL 71 on 07/05/2021 -Current treatment: . Atorvastatin 20 mg once daily QIW -Reviewed side effects - no problems noted -Medications previously tried: none  -Educated on Cholesterol goals;  -Recommended to continue current medication  Low tesosterone (Goal: testosterone correction) -Not ideally controlled -Last testosterone 180 on 04/2019 -Current treatment  . N/a  -Medications previously tried: has not been on replacement therapy   -Will communicate request for urology referral and prn hydrocodone for kidney stones     The patient was given the following information about Chronic Care Management services today, agreed to services, and gave verbal consent: 1. CCM service includes personalized support from designated clinical staff supervised by the primary care provider, including individualized plan of care and coordination with other care providers 2. 24/7 contact phone numbers for assistance for urgent and routine care needs. 3. Service will only be billed when office clinical staff spend 20 minutes or more in a month to coordinate care. 4. Only one practitioner may furnish and bill the service in a calendar month. 5.The patient may stop CCM services at any time (effective at the end of the month) by phone call to the office staff. 6. The patient will be responsible for cost sharing (co-pay) of up to 20% of the service fee (after annual deductible is met). Patient agreed to services and consent obtained.  The patient verbalized understanding of instructions provided today and agreed to receive a MyChart copy of patient instruction and/or educational materials. Telephone follow up appointment with pharmacy team member scheduled for: See next appointment with "Care Management Staff" under "What's Next"  below.  Hypertension,  Adult High blood pressure (hypertension) is when the force of blood pumping through the arteries is too strong. The arteries are the blood vessels that carry blood from the heart throughout the body. Hypertension forces the heart to work harder to pump blood and may cause arteries to become narrow or stiff. Untreated or uncontrolled hypertension can cause a heart attack, heart failure, a stroke, kidney disease, and other problems. A blood pressure reading consists of a higher number over a lower number. Ideally, your blood pressure should be below 120/80. The first ("top") number is called the systolic pressure. It is a measure of the pressure in your arteries as your heart beats. The second ("bottom") number is called the diastolic pressure. It is a measure of the pressure in your arteries as the heart relaxes. What are the causes? The exact cause of this condition is not known. There are some conditions that result in or are related to high blood pressure. What increases the risk? Some risk factors for high blood pressure are under your control. The following factors may make you more likely to develop this condition:  Smoking.  Having type 2 diabetes mellitus, high cholesterol, or both.  Not getting enough exercise or physical activity.  Being overweight.  Having too much fat, sugar, calories, or salt (sodium) in your diet.  Drinking too much alcohol. Some risk factors for high blood pressure may be difficult or impossible to change. Some of these factors include:  Having chronic kidney disease.  Having a family history of high blood pressure.  Age. Risk increases with age.  Race. You may be at higher risk if you are African American.  Gender. Men are at higher risk than women before age 60. After age 55, women are at higher risk than men.  Having obstructive sleep apnea.  Stress. What are the signs or symptoms? High blood pressure may not cause symptoms. Very high blood  pressure (hypertensive crisis) may cause:  Headache.  Anxiety.  Shortness of breath.  Nosebleed.  Nausea and vomiting.  Vision changes.  Severe chest pain.  Seizures. How is this diagnosed? This condition is diagnosed by measuring your blood pressure while you are seated, with your arm resting on a flat surface, your legs uncrossed, and your feet flat on the floor. The cuff of the blood pressure monitor will be placed directly against the skin of your upper arm at the level of your heart. It should be measured at least twice using the same arm. Certain conditions can cause a difference in blood pressure between your right and left arms. Certain factors can cause blood pressure readings to be lower or higher than normal for a short period of time:  When your blood pressure is higher when you are in a health care provider's office than when you are at home, this is called white coat hypertension. Most people with this condition do not need medicines.  When your blood pressure is higher at home than when you are in a health care provider's office, this is called masked hypertension. Most people with this condition may need medicines to control blood pressure. If you have a high blood pressure reading during one visit or you have normal blood pressure with other risk factors, you may be asked to:  Return on a different day to have your blood pressure checked again.  Monitor your blood pressure at home for 1 week or longer. If you are diagnosed with hypertension, you may have  other blood or imaging tests to help your health care provider understand your overall risk for other conditions. How is this treated? This condition is treated by making healthy lifestyle changes, such as eating healthy foods, exercising more, and reducing your alcohol intake. Your health care provider may prescribe medicine if lifestyle changes are not enough to get your blood pressure under control, and if:  Your  systolic blood pressure is above 130.  Your diastolic blood pressure is above 80. Your personal target blood pressure may vary depending on your medical conditions, your age, and other factors. Follow these instructions at home: Eating and drinking  Eat a diet that is high in fiber and potassium, and low in sodium, added sugar, and fat. An example eating plan is called the DASH (Dietary Approaches to Stop Hypertension) diet. To eat this way: ? Eat plenty of fresh fruits and vegetables. Try to fill one half of your plate at each meal with fruits and vegetables. ? Eat whole grains, such as whole-wheat pasta, brown rice, or whole-grain bread. Fill about one fourth of your plate with whole grains. ? Eat or drink low-fat dairy products, such as skim milk or low-fat yogurt. ? Avoid fatty cuts of meat, processed or cured meats, and poultry with skin. Fill about one fourth of your plate with lean proteins, such as fish, chicken without skin, beans, eggs, or tofu. ? Avoid pre-made and processed foods. These tend to be higher in sodium, added sugar, and fat.  Reduce your daily sodium intake. Most people with hypertension should eat less than 1,500 mg of sodium a day.  Do not drink alcohol if: ? Your health care provider tells you not to drink. ? You are pregnant, may be pregnant, or are planning to become pregnant.  If you drink alcohol: ? Limit how much you use to:  0-1 drink a day for women.  0-2 drinks a day for men. ? Be aware of how much alcohol is in your drink. In the [place] one drink equals one 12 oz bottle of beer (355 mL), one 5 oz glass of wine (148 mL), or one 1 oz glass of hard liquor (44 mL).   Lifestyle  Work with your health care provider to maintain a healthy body weight or to lose weight. Ask what an ideal weight is for you.  Get at least 30 minutes of exercise most days of the week. Activities may include walking, swimming, or biking.  Include exercise to strengthen your  muscles (resistance exercise), such as Pilates or lifting weights, as part of your weekly exercise routine. Try to do these types of exercises for 30 minutes at least 3 days a week.  Do not use any products that contain nicotine or tobacco, such as cigarettes, e-cigarettes, and chewing tobacco. If you need help quitting, ask your health care provider.  Monitor your blood pressure at home as told by your health care provider.  Keep all follow-up visits as told by your health care provider. This is important.   Medicines  Take over-the-counter and prescription medicines only as told by your health care provider. Follow directions carefully. Blood pressure medicines must be taken as prescribed.  Do not skip doses of blood pressure medicine. Doing this puts you at risk for problems and can make the medicine less effective.  Ask your health care provider about side effects or reactions to medicines that you should watch for. Contact a health care provider if you:  Think you are having a  reaction to a medicine you are taking.  Have headaches that keep coming back (recurring).  Feel dizzy.  Have swelling in your ankles.  Have trouble with your vision. Get help right away if you:  Develop a severe headache or confusion.  Have unusual weakness or numbness.  Feel faint.  Have severe pain in your chest or abdomen.  Vomit repeatedly.  Have trouble breathing. Summary  Hypertension is when the force of blood pumping through your arteries is too strong. If this condition is not controlled, it may put you at risk for serious complications.  Your personal target blood pressure may vary depending on your medical conditions, your age, and other factors. For most people, a normal blood pressure is less than 120/80.  Hypertension is treated with lifestyle changes, medicines, or a combination of both. Lifestyle changes include losing weight, eating a healthy, low-sodium diet, exercising more,  and limiting alcohol. This information is not intended to replace advice given to you by your health care provider. Make sure you discuss any questions you have with your health care provider. Document Revised: 03/26/2018 Document Reviewed: 03/26/2018 Elsevier Patient Education  2021 [employer].

## 2020-12-13 ENCOUNTER — Telehealth: Payer: Self-pay

## 2020-12-13 NOTE — Telephone Encounter (Signed)
-----   Message from Vivi Barrack, MD sent at 12/13/2020 10:52 AM EDT ----- I am fine with referring to another urologist if he is wanting replacement. [facility] is the only urology office in [place]. If he wants to go somewhere else we will have to refer to [facility], [place], or [place].  Algis Greenhouse. Jerline Pain, MD 12/13/2020 10:53 AM   ----- Message ----- From: Madelin Rear, [facility] Sent: 12/12/2020  10:32 AM EDT To: Vivi Barrack, MD  Hi Dr Jerline Pain, would you be able to refer patient to urology for mgmt of low testosterone?  He previously saw Dr Jeffie Pollock at [facility] but would prefer a different clinic if possible. Last testosterone was 180 on 04/2019.  If needing to see patient/get labs before considering I can make him aware. Thanks!

## 2020-12-28 ENCOUNTER — Telehealth: Payer: Self-pay

## 2020-12-28 NOTE — Chronic Care Management (AMB) (Signed)
Chronic Care Management Pharmacy Assistant   Name: Richard [name]  MRN: [id number] DOB: 19-Apr-1946  Reason for Encounter: General Adherence/ BP Log Call   Recent office visits:  No visits noted  Recent consult visits:  No visits noted  Hospital visits:  None in previous 6 months  Medications: Outpatient Encounter Medications as of 12/28/2020  Medication Sig  . amLODipine (NORVASC) 5 MG tablet TAKE 1 TABLET BY MOUTH EVERY DAY  . atorvastatin (LIPITOR) 20 MG tablet TAKE 1 TABLET BY MOUTH ON MON,WED, FRI, AND SUN  . baclofen (LIORESAL) 10 MG tablet TAKE 1 TABLET BY MOUTH THREE TIMES A DAY AS NEEDED FOR MUSCLE SPASMS (Patient not taking: Reported on 09/05/2020)  . ferrous sulfate 324 MG TBEC Take 324 mg by mouth.  Marland Kitchen HYDROcodone-acetaminophen (NORCO/VICODIN) 5-325 MG tablet Take 1 tablet by mouth every 6 (six) hours as needed for moderate pain.  . metoprolol tartrate (LOPRESSOR) 25 MG tablet TAKE 0.5 TABLETS (12.5 MG TOTAL) BY MOUTH 2 (TWO) TIMES DAILY.  . tamsulosin (FLOMAX) 0.4 MG CAPS capsule TAKE 1 CAPSULE BY MOUTH EVERYDAY AT BEDTIME   No facility-administered encounter medications on file as of 12/28/2020.    Reviewed chart prior to disease state call. Spoke with patient regarding BP  Recent Office Vitals: BP Readings from Last 3 Encounters:  08/11/20 137/81  07/05/20 128/75  02/10/20 130/83   Pulse Readings from Last 3 Encounters:  07/05/20 (!) 52  02/10/20 66  08/13/19 65    Wt Readings from Last 3 Encounters:  08/11/20 194 lb (88 kg)  07/05/20 194 lb (88 kg)  02/10/20 191 lb (86.6 kg)     Kidney Function Lab Results  Component Value Date/Time   CREATININE 1.43 (H) 08/11/2020 09:22 AM   CREATININE 1.24 (H) 07/05/2020 11:06 AM   CREATININE 1.47 (H) 02/10/2020 01:17 PM   CREATININE 1.33 01/01/2014 02:31 PM   GFR 42.77 (L) 05/11/2019 10:39 AM   GFRNONAA 52 (L) 08/11/2020 09:22 AM   GFRAA 54 (L) 02/10/2020 01:17 PM    BMP Latest Ref Rng & Units 08/11/2020  07/05/2020 02/10/2020  Glucose 70 - 99 mg/dL 102(H) 100(H) 100(H)  BUN 8 - 23 mg/dL 30(H) 32(H) 35(H)  Creatinine 0.61 - 1.24 mg/dL 1.43(H) 1.24(H) 1.47(H)  BUN/Creat Ratio 6 - 22 (calc) - 26(H) -  Sodium 135 - 145 mmol/L 140 140 141  Potassium 3.5 - 5.1 mmol/L 4.2 5.1 4.9  Chloride 98 - 111 mmol/L 106 104 105  CO2 22 - 32 mmol/L 29 30 27   Calcium 8.9 - 10.3 mg/dL 9.1 9.4 9.7   I spoke with Richard [name] today and he is doing ok other than seeing blood in his urine. He stated that this has been a persistent issue lately and he is concerned because he has kidney problems. He told me that he experiences hematuria mostly after working out on the treadmill. Exercise is a big part of his daily regimen. He lifts weights 5 x weeks, walks 5-7 miles on the treadmill, and does yard work weekly in order to stay active. Richard [name] stated that he doesn't believe there is a correlation between the activities he's doing and the blood he sees in his urine. He denies any feelings of weakness and un-wellness. We reviewed his options for urology facilities that he can be referred to and he decided to remain with [facility] due to distance and concern for new issues.  Current antihypertensive regimen:  Amlodipine 5 mg once daily  Metoprolol tartrate  25 mg tablet - half tab (12.5 mg) twice daily   How often are you checking your Blood Pressure?  Patient stated he checks his BP regularly but was only able to give one reading   Current home BP readings: 130/78   Star Rating Drugs: Atorvastatin 20 mg- 71 DS last filled 12/19/20  [facility] CPA, CMA

## 2021-01-02 NOTE — Telephone Encounter (Signed)
-----   Message from Vivi Barrack, MD sent at 12/13/2020 10:52 AM EDT ----- I am fine with referring to another urologist if he is wanting replacement. [facility] is the only urology office in [place]. If he wants to go somewhere else we will have to refer to [facility], [place], or [place].  Algis Greenhouse. Jerline Pain, MD 12/13/2020 10:53 AM   ----- Message ----- From: Madelin Rear, [facility] Sent: 12/12/2020  10:32 AM EDT To: Vivi Barrack, MD  Hi Dr Jerline Pain, would you be able to refer patient to urology for mgmt of low testosterone?  He previously saw Dr Jeffie Pollock at [facility] but would prefer a different clinic if possible. Last testosterone was 180 on 04/2019.  If needing to see patient/get labs before considering I can make him aware. Thanks!

## 2021-01-02 NOTE — Telephone Encounter (Signed)
Dr Jerline Pain - we got in touch with patient, he is now ok with referral to [facility].

## 2021-02-01 ENCOUNTER — Other Ambulatory Visit: Payer: Self-pay

## 2021-02-01 DIAGNOSIS — C8295 Follicular lymphoma, unspecified, lymph nodes of inguinal region and lower limb: Secondary | ICD-10-CM

## 2021-02-01 NOTE — Progress Notes (Signed)
HEMATOLOGY/ONCOLOGY CLINIC NOTE  Date of Service: 02/02/2021  Patient Care Team: Vivi Barrack, MD as PCP - General (Family Medicine) Brunetta Genera, MD as Consulting Physician (Hematology) Irine Seal, MD as Attending Physician (Urology) Dyke Maes, OD as Consulting Physician (Optometry) Madelin Rear, [facility] as Pharmacist (Pharmacist)  CHIEF COMPLAINTS/PURPOSE OF CONSULTATION:  F/u for follicular lymphoma  HISTORY OF PRESENTING ILLNESS:   Richard [name] is a wonderful 75 y.o. male who has been referred to [place] by Dr. Brantley Stage for evaluation and management of low grade follicular lymphoma. He is being accompanied by his wife, Richard [name]. He notes that he first noticed a lump to his left inner thigh during a shower approximately 3-4 years ago and noted that it increased in size over the past couple of months.   He had an US of the LLE on 07/16/2018 showed: Soft tissue masses in the left inguinal region most compatible with enlarged lymph nodes/adenopathy, the largest measuring up to 6.1 cm.  On 08/20/2018, the patient underwent cytometry that showed: Tissue-Flow Cytometry with monoclonal B-cell population identified.   Lymph node biopsy on 08/20/2018 showed: Lymph node, needle/core biopsy, Left Inguinal with atypical lymphoid proliferation.   On 09/16/2018, the patient had biopsy completed with pathology showing: Lymph node for lymphoma, left inguinal with low grade follicular lymphoma.   PMHx, he has a hx of squamous cell carcinoma to bilateral sides of his face, none that is active.   SHx, he has had several cystoscopies within the past 4 months and his urologist is   On review of systems, he reports additional left inguinal lump x 4-5 months, intentional weight loss, recurrent kidney stones (most recently December 2019). he denies fever, chills, night sweats, no other areas of concerns, leg swelling, abdominal pain, flank pain, testicular pain/swelling, rashes, and any other  symptoms. He hasn't had his prior stones analyzed to determine the cause of them. He has intentionally lost 45 lbs in the past couple of weeks through keto diet and intermittent fasting. He doesn't consume bread or pasta. He works out with [employer] about every other day. His PCP is Vivi Barrack, MD and his Urologist is Dr. Irine Seal. Pertinent positives are listed and detailed within the above HPI.   Interval History:   Richard [name] returns [name] for management and evaluation of his Follicular Lymphoma. The patient's last visit with [place] was on 08/11/2020. The pt reports that he is doing well overall.  The pt reports that he has been more active with his new treadmill. His hip has been giving him some pain when he goes above 7 miles daily. He tries to stay between 4-7 miles. The patient notes he continues to have kidney stones regularly, having passed three stones since the last visit six months ago. He drinks around 3 bottles of water daily. The stones are visible when he passes them and the pee is Rose wine colored.   Lab results [name] 02/02/2021 of CBC w/diff and CMP is as follows: all values are WNL. CMP stable 02/02/2021 LDH . Lab Results  Component Value Date   LDH 148 02/02/2021   On review of systems, pt reports recent multiple kidney stones passed, intermittent hip pain and denies new lumps/bumps, allergies, sudden weight loss, fevers, chills, night sweats, changes in bowel habits, urinary infections, back pain, leg swelling, and any other symptoms.   MEDICAL HISTORY:  Past Medical History:  Diagnosis Date   Aneurysm ([place])    left common iliac-stable  Arthritis    Fasting hyperglycemia    Follicular lymphoma (HCC) dx'd 07/2018   History of kidney stones    Hyperlipidemia    LDL goal = < 100 based on NMR Lipoproprofile   Hypertension    Mass of left lower extremity    inner thigh   Nephrolithiasis    left    Renal insufficiency    Sepsis ([place]) 03/24/2013   Squamous cell  carcinoma, face    Dr Sarajane Jews   Ureteral stone with hydronephrosis 03/24/2013   left    SURGICAL HISTORY: Past Surgical History:  Procedure Laterality Date   CATARACT EXTRACTION  2005   OD    COLONOSCOPY W/ POLYPECTOMY  2007   adenoma X2 ; [facility]   CYSTOSCOPY W/ URETERAL STENT PLACEMENT Left 03/23/2013   Procedure: CYSTOSCOPY WITH RETROGRADE PYELOGRAM/URETERAL STENT PLACEMENT;  Surgeon: Fredricka Bonine, MD;  Location: [facility];  Service: Urology;  Laterality: Left;   CYSTOSCOPY WITH RETROGRADE PYELOGRAM, URETEROSCOPY AND STENT PLACEMENT Bilateral 07/15/2018   Procedure: CYSTOSCOPY WITH RETROGRADE PYELOGRAM, URETEROSCOPY;  Surgeon: Irine Seal, MD;  Location: [facility];  Service: Urology;  Laterality: Bilateral;   CYSTOSCOPY WITH STENT PLACEMENT Left 07/15/2018   Procedure: CYSTOSCOPY WITH STENT PLACEMENT;  Surgeon: Irine Seal, MD;  Location: [facility];  Service: Urology;  Laterality: Left;   CYSTOSCOPY WITH URETEROSCOPY  02/28/2012   Procedure: CYSTOSCOPY WITH URETEROSCOPY;  Surgeon: Malka So, MD;  Location: [facility];  Service: Urology;  Laterality: Left;   CYSTOSCOPY/URETEROSCOPY/HOLMIUM LASER/STENT PLACEMENT Right 07/31/2018   Procedure: CYSTOSCOPY STENT REMOVAL  RIGHT URETEROSCOPY WITH HOLMIUM LASER POSSIBLE STENT PLACEMENT;  Surgeon: Irine Seal, MD;  Location: [facility];  Service: Urology;  Laterality: Right;   HOLMIUM LASER APPLICATION Left 10/01/3297   Procedure: HOLMIUM LASER APPLICATION;  Surgeon: Malka So, MD;  Location: [facility];  Service: Urology;  Laterality: Left;   HOLMIUM LASER APPLICATION Right 24/26/8341   Procedure: HOLMIUM LASER APPLICATION;  Surgeon: Irine Seal, MD;  Location: [facility];  Service: Urology;  Laterality: Right;   INGUINAL LYMPH NODE BIOPSY Left 09/16/2018   Procedure: LEFT INGUINAL LYMPH NODE BIOPSY;  Surgeon: Erroll Luna, MD;  Location: [place];  Service: General;  Laterality: Left;   LITHOTRIPSY      X2 w/o  benefit   NEPHROLITHOTOMY  02/28/2012   Procedure: NEPHROLITHOTOMY PERCUTANEOUS;  Surgeon: Malka So, MD;  Location: [facility];  Service: Urology;  Laterality: Left;   TONSILLECTOMY     URETEROSCOPY Left 03/31/2013   Procedure: URETEROSCOPY;  Surgeon: Malka So, MD;  Location: [facility];  Service: Urology;  Laterality: Left;   VASECTOMY      SOCIAL HISTORY: Social History   Socioeconomic History   Marital status: Married    Spouse name: Not on file   Number of children: 2   Years of education: 13   Highest education level: Not on file  Occupational History   Occupation: Facilities manager (retired)    Fish farm manager: Richard [name]  Tobacco Use   Smoking status: Never   Smokeless tobacco: Never  Vaping Use   Vaping Use: Never used  Substance and Sexual Activity   Alcohol use: Yes    Comment:  rarely   Drug use: No   Sexual activity: Not on file  Other Topics Concern   Not on file  Social History Narrative   Fun: Work around the house and work out.    Social Determinants of Health   Financial  Resource Strain: Low Risk    Difficulty of Paying Living Expenses: Not hard at all  Food Insecurity: No Food Insecurity   Worried About Charity fundraiser in the Last Year: Never true   Ran Out of Food in the Last Year: Never true  Transportation Needs: No Transportation Needs   Lack of Transportation (Medical): No   Lack of Transportation (Non-Medical): No  Physical Activity: Sufficiently Active   Days of Exercise per Week: 5 days   Minutes of Exercise per Session: 30 min  Stress: No Stress Concern Present   Feeling of Stress : Not at all  Social Connections: Moderately Isolated   Frequency of Communication with Friends and Family: Three times a week   Frequency of Social Gatherings with Friends and Family: Once a week   Attends Religious Services: Never   Marine scientist or Organizations: No   Attends Music therapist: Never   Marital Status: Married  Arboriculturist Violence: Not At Risk   Fear of Current or Ex-Partner: No   Emotionally Abused: No   Physically Abused: No   Sexually Abused: No    FAMILY HISTORY: Family History  Problem Relation Age of Onset   Heart attack Father 28        CBAG X5 vessels   Hyperlipidemia Father    Hypertension Father    Heart disease Father        before age 62   Hypertension Brother    Aortic aneurysm Paternal Uncle        AAA ruptured   Heart disease Mother    Hyperlipidemia Mother    Hypertension Mother    Diabetes Neg Hx     ALLERGIES:  is allergic to angiotensin receptor blockers, ciprofloxacin, and pyridium [phenazopyridine].  MEDICATIONS:  Current Outpatient Medications  Medication Sig Dispense Refill   amLODipine (NORVASC) 5 MG tablet TAKE 1 TABLET BY MOUTH EVERY DAY 90 tablet 3   atorvastatin (LIPITOR) 20 MG tablet TAKE 1 TABLET BY MOUTH ON MON,WED, FRI, AND SUN 51 tablet 3   baclofen (LIORESAL) 10 MG tablet TAKE 1 TABLET BY MOUTH THREE TIMES A DAY AS NEEDED FOR MUSCLE SPASMS (Patient not taking: Reported on 09/05/2020) 30 tablet 5   ferrous sulfate 324 MG TBEC Take 324 mg by mouth.     HYDROcodone-acetaminophen (NORCO/VICODIN) 5-325 MG tablet Take 1 tablet by mouth every 6 (six) hours as needed for moderate pain. 12 tablet 0   metoprolol tartrate (LOPRESSOR) 25 MG tablet TAKE 0.5 TABLETS (12.5 MG TOTAL) BY MOUTH 2 (TWO) TIMES DAILY. 90 tablet 3   tamsulosin (FLOMAX) 0.4 MG CAPS capsule TAKE 1 CAPSULE BY MOUTH EVERYDAY AT BEDTIME 30 capsule 1   No current facility-administered medications for this visit.    REVIEW OF SYSTEMS:   10 Point review of Systems was done is negative except as noted above.  PHYSICAL EXAMINATION: ECOG FS:1 - Symptomatic but completely ambulatory  .BP 134/81 (BP Location: Left Arm, Patient Position: Sitting)   Pulse 60   Temp 97.8 F (36.6 C) (Oral)   Resp 18   Wt 191 lb (86.6 kg)   SpO2 99%   BMI 25.20 kg/m   Vitals:   02/02/21 1404  BP: 134/81   Pulse: 60  Resp: 18  Temp: 97.8 F (36.6 C)  SpO2: 99%   Wt Readings from Last 3 Encounters:  02/02/21 191 lb (86.6 kg)  08/11/20 194 lb (88 kg)  07/05/20 194 lb (88 kg)  Body mass index is 25.2 kg/m.    NAD. GENERAL:alert, in no acute distress and comfortable SKIN: no acute rashes, no significant lesions EYES: conjunctiva are pink and non-injected, sclera anicteric OROPHARYNX: MMM, no exudates, no oropharyngeal erythema or ulceration NECK: supple, no JVD LYMPH:  no palpable lymphadenopathy in the cervical, axillary or inguinal regions LUNGS: clear to auscultation b/l with normal respiratory effort HEART: regular rate & rhythm ABDOMEN:  normoactive bowel sounds , non tender, not distended. No palpable hepatosplenomegaly.  Extremity: no pedal edema PSYCH: alert & oriented x 3 with fluent speech NEURO: no focal motor/sensory deficits  LABORATORY DATA:  I have reviewed the data as listed  . CBC Latest Ref Rng & Units 02/02/2021 08/11/2020 07/05/2020  WBC 4.0 - 10.5 K/uL 6.2 6.1 4.9  Hemoglobin 13.0 - 17.0 g/dL 15.1 14.2 14.9  Hematocrit 39.0 - 52.0 % 47.0 44.6 45.3  Platelets 150 - 400 K/uL 163 131(L) 159    . CMP Latest Ref Rng & Units 08/11/2020 07/05/2020 02/10/2020  Glucose 70 - 99 mg/dL 102(H) 100(H) 100(H)  BUN 8 - 23 mg/dL 30(H) 32(H) 35(H)  Creatinine 0.61 - 1.24 mg/dL 1.43(H) 1.24(H) 1.47(H)  Sodium 135 - 145 mmol/L 140 140 141  Potassium 3.5 - 5.1 mmol/L 4.2 5.1 4.9  Chloride 98 - 111 mmol/L 106 104 105  CO2 22 - 32 mmol/L 29 30 27   Calcium 8.9 - 10.3 mg/dL 9.1 9.4 9.7  Total Protein 6.5 - 8.1 g/dL 6.2(L) 6.4 6.5  Total Bilirubin 0.3 - 1.2 mg/dL 0.8 0.8 0.8  Alkaline Phos 38 - 126 U/L 55 - 72  AST 15 - 41 U/L 17 18 25   ALT 0 - 44 U/L 19 19 24   08/10/2019 CT Abdomen Pelvis Wo Contrast (Accession [id number])      RADIOGRAPHIC STUDIES: I have personally reviewed the radiological images as listed and agreed with the findings in the report. No results  found.  ASSESSMENT & PLAN:   1. Low-grade follicular lymphoma- Stage I to II -First noticed a left inner thigh lump approximately 3-4 years ago and noted that it increased in size over the past couple of months.  -He had an US of the LLE on 07/16/2018 showed: Soft tissue masses in the left inguinal region most compatible with enlarged lymph nodes/adenopathy, the largest measuring up to 6.1 cm. 08/20/2018 Tissue-Flow Cytometry with monoclonal B-cell population identified.  08/20/2018 LN Biopsy revealed Left Inguinal with atypical lymphoid proliferation.  09/16/2018: Lymph node biopsy, left inguinal with low grade follicular lymphoma.   09/29/18 Hep B and Hep C negative 10/08/18 PET/CT revealed Left inguinal and external iliac hypermetabolic adenopathy, consistent with active lymphoma. ([place] 4). 2. No extrapelvic disease identified. 3. Coronary artery atherosclerosis. Aortic Atherosclerosis. 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 08/10/2019 CT Abdomen Pelvis Wo Contrast (Accession [id number]) which revealed "1. Interval response to therapy as evidenced by decrease in size of left external iliac and left inguinal adenopathy. 2. Bilateral renal stones. 3. Aortic atherosclerosis (ICD10-I70.0). 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 -Advised pt the type of lymphoma he had is very slow growing and does not come back very fast, if it does at all.  -Patient is aware to contact if any new symptoms prior to next visit. -Advised pt we can now switch to annual visits. -Advised pt we do not need to keep scanning unless symptoms  or the pt desires one. -No lab or clinical evidence of Follicular Lymphoma recurrence/progression at this time. -No indication for additional rx for patient follicular lymphoma at this time. -Will see back in 12 months with  labs.   FOLLOW UP: RTC with Dr Irene Limbo with labs in 12 months   The total time spent in the appt was 20 minutes and more than 50% was on counseling and direct patient cares.  All of the patient's questions were answered with apparent satisfaction. The patient knows to call the clinic with any problems, questions or concerns.    Sullivan Lone MD [place] AAHIVMS [facility] Hematology/Oncology Physician [facility]  (Office):       [phone] (Work cell):  [phone] (Fax):           [phone]  02/02/2021 2:23 PM  I, Reinaldo Raddle, am acting as scribe for Dr. Sullivan Lone, MD.     .I have reviewed the above documentation for accuracy and completeness, and I agree with the above. Brunetta Genera MD

## 2021-02-02 ENCOUNTER — Other Ambulatory Visit: Payer: Self-pay

## 2021-02-02 ENCOUNTER — Inpatient Hospital Stay: Payer: Medicare Other | Admitting: Hematology

## 2021-02-02 ENCOUNTER — Inpatient Hospital Stay: Payer: Medicare Other | Attending: Hematology

## 2021-02-02 VITALS — BP 134/81 | HR 60 | Temp 97.8°F | Resp 18 | Wt 191.0 lb

## 2021-02-02 DIAGNOSIS — C8295 Follicular lymphoma, unspecified, lymph nodes of inguinal region and lower limb: Secondary | ICD-10-CM

## 2021-02-02 DIAGNOSIS — C8205 Follicular lymphoma grade I, lymph nodes of inguinal region and lower limb: Secondary | ICD-10-CM | POA: Insufficient documentation

## 2021-02-02 LAB — CBC WITH DIFFERENTIAL (CANCER CENTER ONLY)
Abs Immature Granulocytes: 0.02 10*3/uL (ref 0.00–0.07)
Basophils Absolute: 0 10*3/uL (ref 0.0–0.1)
Basophils Relative: 1 %
Eosinophils Absolute: 0.1 10*3/uL (ref 0.0–0.5)
Eosinophils Relative: 2 %
HCT: 47 % (ref 39.0–52.0)
Hemoglobin: 15.1 g/dL (ref 13.0–17.0)
Immature Granulocytes: 0 %
Lymphocytes Relative: 19 %
Lymphs Abs: 1.2 10*3/uL (ref 0.7–4.0)
MCH: 27.4 pg (ref 26.0–34.0)
MCHC: 32.1 g/dL (ref 30.0–36.0)
MCV: 85.3 fL (ref 80.0–100.0)
Monocytes Absolute: 0.5 10*3/uL (ref 0.1–1.0)
Monocytes Relative: 8 %
Neutro Abs: 4.3 10*3/uL (ref 1.7–7.7)
Neutrophils Relative %: 70 %
Platelet Count: 163 10*3/uL (ref 150–400)
RBC: 5.51 MIL/uL (ref 4.22–5.81)
RDW: 14.6 % (ref 11.5–15.5)
WBC Count: 6.2 10*3/uL (ref 4.0–10.5)
nRBC: 0 % (ref 0.0–0.2)

## 2021-02-02 LAB — CMP (CANCER CENTER ONLY)
ALT: 19 U/L (ref 0–44)
AST: 19 U/L (ref 15–41)
Albumin: 4 g/dL (ref 3.5–5.0)
Alkaline Phosphatase: 67 U/L (ref 38–126)
Anion gap: 8 (ref 5–15)
BUN: 36 mg/dL — ABNORMAL HIGH (ref 8–23)
CO2: 29 mmol/L (ref 22–32)
Calcium: 9.4 mg/dL (ref 8.9–10.3)
Chloride: 104 mmol/L (ref 98–111)
Creatinine: 1.41 mg/dL — ABNORMAL HIGH (ref 0.61–1.24)
GFR, Estimated: 53 mL/min — ABNORMAL LOW (ref >60)
Glucose, Bld: 96 mg/dL (ref 70–99)
Potassium: 4.9 mmol/L (ref 3.5–5.1)
Sodium: 141 mmol/L (ref 135–145)
Total Bilirubin: 0.7 mg/dL (ref 0.3–1.2)
Total Protein: 6.5 g/dL (ref 6.5–8.1)

## 2021-02-02 LAB — LACTATE DEHYDROGENASE: LDH: 148 U/L (ref 98–192)

## 2021-02-07 ENCOUNTER — Telehealth: Payer: Self-pay | Admitting: Hematology

## 2021-02-07 NOTE — Telephone Encounter (Signed)
Scheduled follow-up appointment per 7/7 los. Patient is aware. 

## 2021-02-09 ENCOUNTER — Ambulatory Visit: Payer: Medicare Other | Admitting: Hematology

## 2021-02-09 ENCOUNTER — Other Ambulatory Visit: Payer: Medicare Other

## 2021-03-08 DIAGNOSIS — L814 Other melanin hyperpigmentation: Secondary | ICD-10-CM | POA: Diagnosis not present

## 2021-03-08 DIAGNOSIS — D225 Melanocytic nevi of trunk: Secondary | ICD-10-CM | POA: Diagnosis not present

## 2021-03-08 DIAGNOSIS — D692 Other nonthrombocytopenic purpura: Secondary | ICD-10-CM | POA: Diagnosis not present

## 2021-03-08 DIAGNOSIS — L821 Other seborrheic keratosis: Secondary | ICD-10-CM | POA: Diagnosis not present

## 2021-03-08 DIAGNOSIS — D485 Neoplasm of uncertain behavior of skin: Secondary | ICD-10-CM | POA: Diagnosis not present

## 2021-03-08 DIAGNOSIS — Z85828 Personal history of other malignant neoplasm of skin: Secondary | ICD-10-CM | POA: Diagnosis not present

## 2021-03-16 ENCOUNTER — Telehealth: Payer: Self-pay | Admitting: Pharmacist

## 2021-03-16 NOTE — Chronic Care Management (AMB) (Addendum)
    Chronic Care Management Pharmacy Assistant   Name: Richard [name]  MRN: [id number] DOB: 1946/02/07   Reason for Encounter: General Adherence Call    Recent office visits:  None  Recent consult visits:  02/02/2021 (oncology) Brunetta Genera, MD; no medication changes indicated.  Hospital visits:  None in previous 6 months  Medications: Outpatient Encounter Medications as of 03/16/2021  Medication Sig   amLODipine (NORVASC) 5 MG tablet TAKE 1 TABLET BY MOUTH EVERY DAY   atorvastatin (LIPITOR) 20 MG tablet TAKE 1 TABLET BY MOUTH ON MON,WED, FRI, AND SUN   baclofen (LIORESAL) 10 MG tablet TAKE 1 TABLET BY MOUTH THREE TIMES A DAY AS NEEDED FOR MUSCLE SPASMS (Patient not taking: Reported on 09/05/2020)   ferrous sulfate 324 MG TBEC Take 324 mg by mouth.   HYDROcodone-acetaminophen (NORCO/VICODIN) 5-325 MG tablet Take 1 tablet by mouth every 6 (six) hours as needed for moderate pain.   metoprolol tartrate (LOPRESSOR) 25 MG tablet TAKE 0.5 TABLETS (12.5 MG TOTAL) BY MOUTH 2 (TWO) TIMES DAILY.   tamsulosin (FLOMAX) 0.4 MG CAPS capsule TAKE 1 CAPSULE BY MOUTH EVERYDAY AT BEDTIME   No facility-administered encounter medications on file as of 03/16/2021.   Patient Questions: Have you had any problems recently with your health? Patient states he has not had any problems recently with his health.  Have you had any problems with your pharmacy? Patient states he has not had any problems recently with his pharmacy.  What issues or side effects are you having with your medications? Patient states he is not currently having any issues or side effects with any of his medications.  What would you like me to pass along to Leata Mouse, CPP for him to help you with?  Patient states he does not have anything to pass along at this time.  What can we do to take care of you better? Patient did not have any suggestions.  Patient states he will check his blood pressure occasionally if he is  "feeling a little bit off". He reports his readings around 128/82.  Future Appointments  Date Time Provider [place]  09/11/2021 11:45 AM [facility] HEALTH COACH [facility]  02/02/2022 11:45 AM [facility] None  02/02/2022 12:20 PM Brunetta Genera, MD [facility] None     Star Rating Drugs: Atorvastatin 20 mg last filled 02/24/2021 71 DS  April D Calhoun, [place] Pharmacist Assistant [phone]   10 minutes spent in review, coordination, and documentation.  Reviewed by: Beverly Milch, PharmD Clinical Pharmacist [phone]

## 2021-05-11 ENCOUNTER — Other Ambulatory Visit: Payer: Self-pay | Admitting: Family Medicine

## 2021-05-30 ENCOUNTER — Other Ambulatory Visit: Payer: Self-pay | Admitting: Family Medicine

## 2021-06-27 ENCOUNTER — Other Ambulatory Visit: Payer: Self-pay | Admitting: Family Medicine

## 2021-07-22 ENCOUNTER — Other Ambulatory Visit: Payer: Self-pay | Admitting: Family Medicine

## 2021-08-22 ENCOUNTER — Telehealth: Payer: Self-pay

## 2021-08-22 ENCOUNTER — Other Ambulatory Visit: Payer: Self-pay | Admitting: *Deleted

## 2021-08-22 MED ORDER — TAMSULOSIN HCL 0.4 MG PO CAPS: ORAL_CAPSULE | ORAL | 0 refills | Status: DC

## 2021-08-22 NOTE — Telephone Encounter (Signed)
MEDICATION: tamsulosin (FLOMAX) 0.4 MG CAPS capsule  PHARMACY:  [address] [facility] [id number] [place], [address] Phone:  [phone]  Fax:  [phone]      Comments:   **Let patient know to contact pharmacy at the end of the day to make sure medication is ready. **  ** Please notify patient to allow 48-72 hours to process**  **Encourage patient to contact the pharmacy for refills or they can request refills through [facility]**

## 2021-08-22 NOTE — Telephone Encounter (Signed)
Rx send to pharmacy  Patient need OV for future refills

## 2021-08-23 ENCOUNTER — Other Ambulatory Visit: Payer: Self-pay | Admitting: *Deleted

## 2021-08-23 MED ORDER — TAMSULOSIN HCL 0.4 MG PO CAPS: ORAL_CAPSULE | ORAL | 0 refills | Status: DC

## 2021-08-23 NOTE — Telephone Encounter (Signed)
Pt states refill needs to be sent to: [facility] [id number] [place], [place] [address] Phone:  [phone]  Fax:  [phone]

## 2021-08-23 NOTE — Telephone Encounter (Signed)
Rx send to [facility]

## 2021-09-11 ENCOUNTER — Other Ambulatory Visit: Payer: Self-pay

## 2021-09-11 ENCOUNTER — Ambulatory Visit (INDEPENDENT_AMBULATORY_CARE_PROVIDER_SITE_OTHER): Payer: Medicare Other

## 2021-09-11 VITALS — BP 122/76 | HR 79 | Temp 97.9°F | Wt 183.8 lb

## 2021-09-11 DIAGNOSIS — Z0000 Encounter for general adult medical examination without abnormal findings: Secondary | ICD-10-CM

## 2021-09-11 NOTE — Patient Instructions (Addendum)
Richard [name] , Thank you for taking time to come for your Medicare Wellness Visit. I appreciate your ongoing commitment to your health goals. Please review the following plan we discussed and let me know if I can assist you in the future.   Screening recommendations/referrals:  Colonoscopy: Completed 11/16/20 repeat every 3 years  Recommended yearly ophthalmology/optometry visit for glaucoma screening and checkup Recommended yearly dental visit for hygiene and checkup  Vaccinations: Influenza vaccine: Pt stated completed  Pneumococcal vaccine: Due discussed  Tdap vaccine: Done 07/04/15 repeat every 10 years  Shingles vaccine: dose 05/16/20 Covid-19: Done 2/6/, 2/27, 05/10/20, 01/12/21 & 08/04/21  Advanced directives: Advance directive discussed with you today. I have provided a copy for you to complete at home and have notarized. Once this is complete please bring a copy in to our office so we can scan it into your chart.  Conditions/risks identified: Get strength increased  Next appointment: Follow up in one year for your annual wellness visit.   Preventive Care 74 Years and Older, Male Preventive care refers to lifestyle choices and visits with your health care provider that can promote health and wellness. What does preventive care include? A yearly physical exam. This is also called an annual well check. Dental exams once or twice a year. Routine eye exams. Ask your health care provider how often you should have your eyes checked. Personal lifestyle choices, including: Daily care of your teeth and gums. Regular physical activity. Eating a healthy diet. Avoiding tobacco and drug use. Limiting alcohol use. Practicing safe sex. Taking low doses of aspirin every day. Taking vitamin and mineral supplements as recommended by your health care provider. What happens during an annual well check? The services and screenings done by your health care provider during your annual well check will  depend on your age, overall health, lifestyle risk factors, and family history of disease. Counseling  Your health care provider may ask you questions about your: Alcohol use. Tobacco use. Drug use. Emotional well-being. Home and relationship well-being. Sexual activity. Eating habits. History of falls. Memory and ability to understand (cognition). Work and work Statistician. Screening  You may have the following tests or measurements: Height, weight, and BMI. Blood pressure. Lipid and cholesterol levels. These may be checked every 5 years, or more frequently if you are over 63 years old. Skin check. Lung cancer screening. You may have this screening every year starting at age 74 if you have a 30-pack-year history of smoking and currently smoke or have quit within the past 15 years. Fecal occult blood test (FOBT) of the stool. You may have this test every year starting at age 74. Flexible sigmoidoscopy or colonoscopy. You may have a sigmoidoscopy every 5 years or a colonoscopy every 10 years starting at age 74. Prostate cancer screening. Recommendations will vary depending on your family history and other risks. Hepatitis C blood test. Hepatitis B blood test. Sexually transmitted disease (STD) testing. Diabetes screening. This is done by checking your blood sugar (glucose) after you have not eaten for a while (fasting). You may have this done every 1-3 years. Abdominal aortic aneurysm (AAA) screening. You may need this if you are a current or former smoker. Osteoporosis. You may be screened starting at age 74 if you are at high risk. Talk with your health care provider about your test results, treatment options, and if necessary, the need for more tests. Vaccines  Your health care provider may recommend certain vaccines, such as: Influenza vaccine. This is  recommended every year. Tetanus, diphtheria, and acellular pertussis (Tdap, Td) vaccine. You may need a Td booster every 10  years. Zoster vaccine. You may need this after age 79. Pneumococcal 13-valent conjugate (PCV13) vaccine. One dose is recommended after age 4. Pneumococcal polysaccharide (PPSV23) vaccine. One dose is recommended after age 71. Talk to your health care provider about which screenings and vaccines you need and how often you need them. This information is not intended to replace advice given to you by your health care provider. Make sure you discuss any questions you have with your health care provider. Document Released: 08/12/2015 Document Revised: 04/04/2016 Document Reviewed: 05/17/2015 Elsevier Interactive Patient Education  2017 [place] Prevention in the Home Falls can cause injuries. They can happen to people of all ages. There are many things you can do to make your home safe and to help prevent falls. What can I do on the outside of my home? Regularly fix the edges of walkways and driveways and fix any cracks. Remove anything that might make you trip as you walk through a door, such as a raised step or threshold. Trim any bushes or trees on the path to your home. Use bright outdoor lighting. Clear any walking paths of anything that might make someone trip, such as rocks or tools. Regularly check to see if handrails are loose or broken. Make sure that both sides of any steps have handrails. Any raised decks and porches should have guardrails on the edges. Have any leaves, snow, or ice cleared regularly. Use sand or salt on walking paths during winter. Clean up any spills in your garage right away. This includes oil or grease spills. What can I do in the bathroom? Use night lights. Install grab bars by the toilet and in the tub and shower. Do not use towel bars as grab bars. Use non-skid mats or decals in the tub or shower. If you need to sit down in the shower, use a plastic, non-slip stool. Keep the floor dry. Clean up any water that spills on the floor as soon as it  happens. Remove soap buildup in the tub or shower regularly. Attach bath mats securely with double-sided non-slip rug tape. Do not have throw rugs and other things on the floor that can make you trip. What can I do in the bedroom? Use night lights. Make sure that you have a light by your bed that is easy to reach. Do not use any sheets or blankets that are too big for your bed. They should not hang down onto the floor. Have a firm chair that has side arms. You can use this for support while you get dressed. Do not have throw rugs and other things on the floor that can make you trip. What can I do in the kitchen? Clean up any spills right away. Avoid walking on wet floors. Keep items that you use a lot in easy-to-reach places. If you need to reach something above you, use a strong step stool that has a grab bar. Keep electrical cords out of the way. Do not use floor polish or wax that makes floors slippery. If you must use wax, use non-skid floor wax. Do not have throw rugs and other things on the floor that can make you trip. What can I do with my stairs? Do not leave any items on the stairs. Make sure that there are handrails on both sides of the stairs and use them. Fix handrails that are  broken or loose. Make sure that handrails are as long as the stairways. Check any carpeting to make sure that it is firmly attached to the stairs. Fix any carpet that is loose or worn. Avoid having throw rugs at the top or bottom of the stairs. If you do have throw rugs, attach them to the floor with carpet tape. Make sure that you have a light switch at the top of the stairs and the bottom of the stairs. If you do not have them, ask someone to add them for you. What else can I do to help prevent falls? Wear shoes that: Do not have high heels. Have rubber bottoms. Are comfortable and fit you well. Are closed at the toe. Do not wear sandals. If you use a stepladder: Make sure that it is fully opened.  Do not climb a closed stepladder. Make sure that both sides of the stepladder are locked into place. Ask someone to hold it for you, if possible. Clearly mark and make sure that you can see: Any grab bars or handrails. First and last steps. Where the edge of each step is. Use tools that help you move around (mobility aids) if they are needed. These include: Canes. Walkers. Scooters. Crutches. Turn on the lights when you go into a dark area. Replace any light bulbs as soon as they burn out. Set up your furniture so you have a clear path. Avoid moving your furniture around. If any of your floors are uneven, fix them. If there are any pets around you, be aware of where they are. Review your medicines with your doctor. Some medicines can make you feel dizzy. This can increase your chance of falling. Ask your doctor what other things that you can do to help prevent falls. This information is not intended to replace advice given to you by your health care provider. Make sure you discuss any questions you have with your health care provider. Document Released: 05/12/2009 Document Revised: 12/22/2015 Document Reviewed: 08/20/2014 Elsevier Interactive Patient Education  2017 [employer].

## 2021-09-11 NOTE — Progress Notes (Addendum)
Subjective:   Richard [name] is a 76 y.o. male who presents for Medicare Annual/Subsequent preventive examination.  Review of Systems     Cardiac Risk Factors include: advanced age (>69men, >48 women);hypertension;dyslipidemia;male gender     Objective:    Today's Vitals   09/11/21 1140  BP: 122/76  Pulse: 79  Temp: 97.9 F (36.6 C)  SpO2: 99%  Weight: 183 lb 12.8 oz (83.4 kg)   Body mass index is 24.25 kg/m.  Advanced Directives 09/11/2021 09/05/2020 02/10/2020 08/13/2019 04/24/2019 12/16/2018 09/08/2018  Does Patient Have a Medical Advance Directive? No No No No Yes No No  Type of Advance Directive - - - - Living will;Healthcare Power of Attorney - -  Does patient want to make changes to medical advance directive? - - - - No - Patient declined - -  Copy of [place] in Chart? - - - - No - copy requested - -  Would patient like information on creating a medical advance directive? No - Patient declined No - Patient declined No - Patient declined No - Patient declined - No - Patient declined No - Patient declined  Pre-existing out of facility DNR order (yellow form or pink MOST form) - - - - - - -    Current Medications (verified) Outpatient Encounter Medications as of 09/11/2021  Medication Sig   amLODipine (NORVASC) 5 MG tablet TAKE ONE TABLET BY MOUTH DAILY   atorvastatin (LIPITOR) 20 MG tablet TAKE 1 TABLET BY MOUTH ON MON, WED, FRI, AND SUN   baclofen (LIORESAL) 10 MG tablet TAKE 1 TABLET BY MOUTH THREE TIMES A DAY AS NEEDED FOR MUSCLE SPASMS   ferrous sulfate 324 MG TBEC Take 324 mg by mouth.   metoprolol tartrate (LOPRESSOR) 25 MG tablet TAKE 1/2 TABLET BY MOUTH TWICE A DAY   tamsulosin (FLOMAX) 0.4 MG CAPS capsule TAKE 1 CAPSULE BY MOUTH EVERYDAY AT BEDTIME   [DISCONTINUED] HYDROcodone-acetaminophen (NORCO/VICODIN) 5-325 MG tablet Take 1 tablet by mouth every 6 (six) hours as needed for moderate pain.   No facility-administered encounter medications on  file as of 09/11/2021.    Allergies (verified) Angiotensin receptor blockers, Ciprofloxacin, and Pyridium [phenazopyridine]   History: Past Medical History:  Diagnosis Date   Aneurysm ([place])    left common iliac-stable   Arthritis    Fasting hyperglycemia    Follicular lymphoma ([facility]) dx'd 07/2018   History of kidney stones    Hyperlipidemia    LDL goal = < 100 based on NMR Lipoproprofile   Hypertension    Mass of left lower extremity    inner thigh   Nephrolithiasis    left    Renal insufficiency    Sepsis ([place]) 03/24/2013   Squamous cell carcinoma, face    Dr Sarajane Jews   Ureteral stone with hydronephrosis 03/24/2013   left   Past Surgical History:  Procedure Laterality Date   CATARACT EXTRACTION  2005   OD    COLONOSCOPY W/ POLYPECTOMY  2007   adenoma X2 ; [facility]   CYSTOSCOPY W/ URETERAL STENT PLACEMENT Left 03/23/2013   Procedure: CYSTOSCOPY WITH RETROGRADE PYELOGRAM/URETERAL STENT PLACEMENT;  Surgeon: Fredricka Bonine, MD;  Location: [facility];  Service: Urology;  Laterality: Left;   CYSTOSCOPY WITH RETROGRADE PYELOGRAM, URETEROSCOPY AND STENT PLACEMENT Bilateral 07/15/2018   Procedure: CYSTOSCOPY WITH RETROGRADE PYELOGRAM, URETEROSCOPY;  Surgeon: Irine Seal, MD;  Location: [facility];  Service: Urology;  Laterality: Bilateral;   CYSTOSCOPY WITH STENT PLACEMENT Left 07/15/2018  Procedure: CYSTOSCOPY WITH STENT PLACEMENT;  Surgeon: Irine Seal, MD;  Location: [facility];  Service: Urology;  Laterality: Left;   CYSTOSCOPY WITH URETEROSCOPY  02/28/2012   Procedure: CYSTOSCOPY WITH URETEROSCOPY;  Surgeon: Malka So, MD;  Location: [facility];  Service: Urology;  Laterality: Left;   CYSTOSCOPY/URETEROSCOPY/HOLMIUM LASER/STENT PLACEMENT Right 07/31/2018   Procedure: CYSTOSCOPY STENT REMOVAL  RIGHT URETEROSCOPY WITH HOLMIUM LASER POSSIBLE STENT PLACEMENT;  Surgeon: Irine Seal, MD;  Location: [facility];  Service: Urology;  Laterality: Right;   HOLMIUM LASER APPLICATION  Left 10/05/1015   Procedure: HOLMIUM LASER APPLICATION;  Surgeon: Malka So, MD;  Location: [facility];  Service: Urology;  Laterality: Left;   HOLMIUM LASER APPLICATION Right 51/08/5850   Procedure: HOLMIUM LASER APPLICATION;  Surgeon: Irine Seal, MD;  Location: [facility];  Service: Urology;  Laterality: Right;   INGUINAL LYMPH NODE BIOPSY Left 09/16/2018   Procedure: LEFT INGUINAL LYMPH NODE BIOPSY;  Surgeon: Erroll Luna, MD;  Location: [facility];  Service: General;  Laterality: Left;   LITHOTRIPSY      X2 w/o benefit   NEPHROLITHOTOMY  02/28/2012   Procedure: NEPHROLITHOTOMY PERCUTANEOUS;  Surgeon: Malka So, MD;  Location: [facility];  Service: Urology;  Laterality: Left;   TONSILLECTOMY     URETEROSCOPY Left 03/31/2013   Procedure: URETEROSCOPY;  Surgeon: Malka So, MD;  Location: [facility];  Service: Urology;  Laterality: Left;   VASECTOMY     Family History  Problem Relation Age of Onset   Heart attack Father 54        CBAG X5 vessels   Hyperlipidemia Father    Hypertension Father    Heart disease Father        before age 58   Hypertension Brother    Aortic aneurysm Paternal Uncle        AAA ruptured   Heart disease Mother    Hyperlipidemia Mother    Hypertension Mother    Diabetes Neg Hx    Social History   Socioeconomic History   Marital status: Married    Spouse name: Not on file   Number of children: 2   Years of education: 13   Highest education level: Not on file  Occupational History   Occupation: Facilities manager (retired)    Fish farm manager: [employer]  Tobacco Use   Smoking status: Never   Smokeless tobacco: Never  Vaping Use   Vaping Use: Never used  Substance and Sexual Activity   Alcohol use: Yes    Comment:  rarely   Drug use: No   Sexual activity: Not on file  Other Topics Concern   Not on file  Social History Narrative   Fun: Work around the house and work out.    Social Determinants of Health   Financial Resource Strain: Low  Risk    Difficulty of Paying Living Expenses: Not hard at all  Food Insecurity: No Food Insecurity   Worried About Charity fundraiser in the Last Year: Never true   [place] in the Last Year: Never true  Transportation Needs: No Transportation Needs   Lack of Transportation (Medical): No   Lack of Transportation (Non-Medical): No  Physical Activity: Sufficiently Active   Days of Exercise per Week: 5 days   Minutes of Exercise per Session: 90 min  Stress: No Stress Concern Present   Feeling of Stress : Not at all  Social Connections: Moderately Isolated   Frequency of Communication  with Friends and Family: Twice a week   Frequency of Social Gatherings with Friends and Family: Twice a week   Attends Religious Services: Never   Printmaker: No   Attends Music therapist: Never   Marital Status: Married    Tobacco Counseling Counseling given: Not Answered   Clinical Intake:  Pre-visit preparation completed: Yes  Pain : No/denies pain     Nutritional Risks: None Diabetes: No  How often do you need to have someone help you when you read instructions, pamphlets, or other written materials from your doctor or pharmacy?: 1 - Never  Diabetic?no  Interpreter Needed?: No  Information entered by :: Charlott Rakes, LPN   Activities of Daily Living In your present state of health, do you have any difficulty performing the following activities: 09/11/2021  Hearing? N  Vision? N  Difficulty concentrating or making decisions? N  Walking or climbing stairs? N  Dressing or bathing? N  Doing errands, shopping? N  Preparing Food and eating ? N  Using the Toilet? N  In the past six months, have you accidently leaked urine? N  Comment urgency at times  Do you have problems with loss of bowel control? N  Managing your Medications? N  Managing your Finances? N  Housekeeping or managing your Housekeeping? N  Some recent data might be  hidden    Patient Care Team: Vivi Barrack, MD as PCP - General (Family Medicine) Brunetta Genera, MD as Consulting Physician (Hematology) Irine Seal, MD as Attending Physician (Urology) Dyke Maes, OD as Consulting Physician (Optometry) Madelin Rear, [facility] as Pharmacist (Pharmacist)  Indicate any recent Medical Services you may have received from other than [facility] providers in the past year (date may be approximate).     Assessment:   This is a routine wellness examination for Richard [name].  Hearing/Vision screen Hearing Screening - Comments:: Pt denies any hearing issues  Vision Screening - Comments:: Encouraged to follow up with providers   Dietary issues and exercise activities discussed: Current Exercise Habits: Home exercise routine, Type of exercise: walking;treadmill;Other - see comments, Time (Minutes): > 60, Frequency (Times/Week): 5, Weekly Exercise (Minutes/Week): 0   Goals Addressed             This Visit's Progress    Patient Stated       Not getting weaker and increase strength        Depression Screen PHQ 2/9 Scores 09/11/2021 09/05/2020 07/05/2020 04/24/2019 06/17/2018 06/26/2017 05/15/2013  PHQ - 2 Score 0 0 0 0 0 0 0    Fall Risk Fall Risk  09/11/2021 09/05/2020 07/05/2020 04/24/2019 06/26/2017  Falls in the past year? 0 0 0 0 No  Number falls in past yr: 0 0 - 0 -  Injury with Fall? 0 0 - 0 -  Risk for fall due to : Impaired vision Impaired vision - - -  Follow up Falls prevention discussed Falls prevention discussed - Falls prevention discussed;Education provided;Falls evaluation completed -    FALL RISK PREVENTION PERTAINING TO THE HOME:  Any stairs in or around the home? Yes  If so, are there any without handrails? No  Home free of loose throw rugs in walkways, pet beds, electrical cords, etc? Yes  Adequate lighting in your home to reduce risk of falls? Yes   ASSISTIVE DEVICES UTILIZED TO PREVENT FALLS:  Life alert? No  Use of a cane, walker  or w/c? No  Grab bars in the bathroom?  No  Shower chair or bench in shower? Yes  Elevated toilet seat or a handicapped toilet? No   TIMED UP AND GO:  Was the test performed? Yes .  Length of time to ambulate 10 feet: 10 sec.   Gait steady and fast without use of assistive device  Cognitive Function:     6CIT Screen 09/11/2021 09/05/2020  What Year? 0 points 0 points  What month? 0 points 0 points  What time? 0 points -  Count back from 20 0 points 0 points  Months in reverse 0 points 0 points  Repeat phrase 0 points 0 points  Total Score 0 -    Immunizations Immunization History  Administered Date(s) Administered   Influenza Whole 04/29/2012   Influenza, High Dose Seasonal PF 05/15/2013, 05/24/2016, 06/04/2017, 05/26/2018, 04/24/2019   Influenza,inj,Quad PF,6+ Mos 07/04/2015   Influenza,inj,quad, With Preservative 05/16/2020   Influenza-Unspecified 04/24/2019   PFIZER(Purple Top)SARS-COV-2 Vaccination 09/05/2019, 09/26/2019, 05/10/2020, 01/12/2021, 08/04/2021   Tdap 07/04/2015   Zoster Recombinat (Shingrix) 05/16/2020    TDAP status: Up to date  Flu Vaccine status: Up to date  Pneumococcal vaccine status: Due, Education has been provided regarding the importance of this vaccine. Advised may receive this vaccine at local pharmacy or Health Dept. Aware to provide a copy of the vaccination record if obtained from local pharmacy or Health Dept. Verbalized acceptance and understanding.  Covid-19 vaccine status: Completed vaccines  Qualifies for Shingles Vaccine? Yes   Zostavax completed Yes   Shingrix Completed?: Yes  Screening Tests Health Maintenance  Topic Date Due   Pneumonia Vaccine 57+ Years old (1 - PCV) Never done   Zoster Vaccines- Shingrix (2 of 2) 07/11/2020   INFLUENZA VACCINE  02/27/2021   COVID-19 Vaccine (6 - Booster for Pfizer series) 09/29/2021   Fecal DNA (Cologuard)  11/17/2023   TETANUS/TDAP  07/03/2025   Hepatitis C Screening  Completed   HPV  VACCINES  Aged Out    Health Maintenance  Health Maintenance Due  Topic Date Due   Pneumonia Vaccine 34+ Years old (1 - PCV) Never done   Zoster Vaccines- Shingrix (2 of 2) 07/11/2020   INFLUENZA VACCINE  02/27/2021    Colorectal cancer screening: Type of screening: Cologuard. Completed 11/16/20. Repeat every 3 years   Additional Screening:  Hepatitis C Screening:  Completed 09/29/18  Vision Screening: Recommended annual ophthalmology exams for early detection of glaucoma and other disorders of the eye. Is the patient up to date with their annual eye exam?  No  Who is the provider or what is the name of the office in which the patient attends annual eye exams? Encouraged to follow up with provider  If pt is not established with a provider, would they like to be referred to a provider to establish care? No .   Dental Screening: Recommended annual dental exams for proper oral hygiene  Community Resource Referral / Chronic Care Management: CRR required this visit?  No   CCM required this visit?  No      Plan:     I have personally reviewed and noted the following in the patients chart:   Medical and social history Use of alcohol, tobacco or illicit drugs  Current medications and supplements including opioid prescriptions. Patient is not currently taking opioid prescriptions. Functional ability and status Nutritional status Physical activity Advanced directives List of other physicians Hospitalizations, surgeries, and ER visits in previous 12 months Vitals Screenings to include cognitive, depression, and falls Referrals and appointments  In addition, I have reviewed and discussed with patient certain preventive protocols, quality metrics, and best practice recommendations. A written personalized care plan for preventive services as well as general preventive health recommendations were provided to patient.     Willette Brace, LPN   [id number]/7915   Nurse Notes:  None

## 2021-09-14 ENCOUNTER — Other Ambulatory Visit: Payer: Self-pay | Admitting: Family Medicine

## 2021-09-21 ENCOUNTER — Other Ambulatory Visit: Payer: Self-pay

## 2021-09-21 ENCOUNTER — Ambulatory Visit (INDEPENDENT_AMBULATORY_CARE_PROVIDER_SITE_OTHER): Payer: Medicare Other | Admitting: Family Medicine

## 2021-09-21 ENCOUNTER — Encounter: Payer: Self-pay | Admitting: Family Medicine

## 2021-09-21 VITALS — BP 124/73 | HR 61 | Temp 98.1°F | Ht 73.0 in | Wt 183.8 lb

## 2021-09-21 DIAGNOSIS — E782 Mixed hyperlipidemia: Secondary | ICD-10-CM | POA: Diagnosis not present

## 2021-09-21 DIAGNOSIS — Z125 Encounter for screening for malignant neoplasm of prostate: Secondary | ICD-10-CM | POA: Diagnosis not present

## 2021-09-21 DIAGNOSIS — C8295 Follicular lymphoma, unspecified, lymph nodes of inguinal region and lower limb: Secondary | ICD-10-CM | POA: Diagnosis not present

## 2021-09-21 DIAGNOSIS — N529 Male erectile dysfunction, unspecified: Secondary | ICD-10-CM

## 2021-09-21 DIAGNOSIS — I723 Aneurysm of iliac artery: Secondary | ICD-10-CM | POA: Diagnosis not present

## 2021-09-21 DIAGNOSIS — N1831 Chronic kidney disease, stage 3a: Secondary | ICD-10-CM

## 2021-09-21 DIAGNOSIS — R7989 Other specified abnormal findings of blood chemistry: Secondary | ICD-10-CM

## 2021-09-21 DIAGNOSIS — I10 Essential (primary) hypertension: Secondary | ICD-10-CM

## 2021-09-21 DIAGNOSIS — Z87442 Personal history of urinary calculi: Secondary | ICD-10-CM | POA: Diagnosis not present

## 2021-09-21 DIAGNOSIS — Z0001 Encounter for general adult medical examination with abnormal findings: Secondary | ICD-10-CM | POA: Diagnosis not present

## 2021-09-21 DIAGNOSIS — R7301 Impaired fasting glucose: Secondary | ICD-10-CM | POA: Diagnosis not present

## 2021-09-21 LAB — URINALYSIS, ROUTINE W REFLEX MICROSCOPIC
Bilirubin Urine: NEGATIVE
Ketones, ur: NEGATIVE
Leukocytes,Ua: NEGATIVE
Nitrite: NEGATIVE
Specific Gravity, Urine: 1.025 (ref 1.000–1.030)
Total Protein, Urine: NEGATIVE
Urine Glucose: NEGATIVE
Urobilinogen, UA: 0.2 (ref 0.0–1.0)
pH: 5.5 (ref 5.0–8.0)

## 2021-09-21 LAB — LIPID PANEL
Cholesterol: 151 mg/dL (ref 0–200)
HDL: 74.2 mg/dL (ref >39.00)
LDL Cholesterol: 67 mg/dL (ref 0–99)
NonHDL: 76.85
Total CHOL/HDL Ratio: 2
Triglycerides: 50 mg/dL (ref 0.0–149.0)
VLDL: 10 mg/dL (ref 0.0–40.0)

## 2021-09-21 LAB — COMPREHENSIVE METABOLIC PANEL
ALT: 20 U/L (ref 0–53)
AST: 22 U/L (ref 0–37)
Albumin: 4.5 g/dL (ref 3.5–5.2)
Alkaline Phosphatase: 59 U/L (ref 39–117)
BUN: 33 mg/dL — ABNORMAL HIGH (ref 6–23)
CO2: 30 mEq/L (ref 19–32)
Calcium: 9.3 mg/dL (ref 8.4–10.5)
Chloride: 104 mEq/L (ref 96–112)
Creatinine, Ser: 1.43 mg/dL (ref 0.40–1.50)
GFR: 48.51 mL/min — ABNORMAL LOW (ref >60.00)
Glucose, Bld: 100 mg/dL — ABNORMAL HIGH (ref 70–99)
Potassium: 4.3 mEq/L (ref 3.5–5.1)
Sodium: 140 mEq/L (ref 135–145)
Total Bilirubin: 1 mg/dL (ref 0.2–1.2)
Total Protein: 6.2 g/dL (ref 6.0–8.3)

## 2021-09-21 LAB — CBC
HCT: 45.3 % (ref 39.0–52.0)
Hemoglobin: 14.6 g/dL (ref 13.0–17.0)
MCHC: 32.2 g/dL (ref 30.0–36.0)
MCV: 84.4 fl (ref 78.0–100.0)
Platelets: 150 10*3/uL (ref 150.0–400.0)
RBC: 5.37 Mil/uL (ref 4.22–5.81)
RDW: 14.3 % (ref 11.5–15.5)
WBC: 5.1 10*3/uL (ref 4.0–10.5)

## 2021-09-21 LAB — PSA: PSA: 1.43 ng/mL (ref 0.10–4.00)

## 2021-09-21 LAB — TESTOSTERONE: Testosterone: 246.42 ng/dL — ABNORMAL LOW (ref 300.00–890.00)

## 2021-09-21 LAB — HEMOGLOBIN A1C: Hgb A1c MFr Bld: 5.8 % (ref 4.6–6.5)

## 2021-09-21 LAB — TSH: TSH: 2.07 u[IU]/mL (ref 0.35–5.50)

## 2021-09-21 MED ORDER — TAMSULOSIN HCL 0.4 MG PO CAPS: 0.4000 mg | ORAL_CAPSULE | Freq: Every day | ORAL | 3 refills | Status: DC

## 2021-09-21 MED ORDER — ATORVASTATIN CALCIUM 20 MG PO TABS: ORAL_TABLET | ORAL | 3 refills | Status: DC

## 2021-09-21 MED ORDER — AMLODIPINE BESYLATE 5 MG PO TABS: 5.0000 mg | ORAL_TABLET | Freq: Every day | ORAL | 3 refills | Status: DC

## 2021-09-21 MED ORDER — HYDROCODONE-ACETAMINOPHEN 5-325 MG PO TABS: 1.0000 | ORAL_TABLET | Freq: Three times a day (TID) | ORAL | 0 refills | Status: AC | PRN

## 2021-09-21 NOTE — Assessment & Plan Note (Signed)
Check lipids.  He is on Lipitor 20mg  four times weekly.

## 2021-09-21 NOTE — Patient Instructions (Signed)
It was very nice to see you today!  We will check blood work today and a urine sample.  We may have you see a urologist to discuss testosterone replacement if you are interested.  We will refill your medications today.  Please continue to work on diet and exercise.  I will see back in 1 year for your next physical.  Please come back sooner if needed.  Take care, Dr Jerline Pain  PLEASE NOTE:  If you had any lab tests please let us know if you have not heard back within a few days. You may see your results on mychart before we have a chance to review them but we will give you a call once they are reviewed by [place]. If we ordered any referrals today, please let us know if you have not heard from their office within the next week.   Please try these tips to maintain a healthy lifestyle:  Eat at least 3 REAL meals and 1-2 snacks per day.  Aim for no more than 5 hours between eating.  If you eat breakfast, please do so within one hour of getting up.   Each meal should contain half fruits/vegetables, one quarter protein, and one quarter carbs (no bigger than a computer mouse)  Cut down on sweet beverages. This includes juice, soda, and sweet tea.   Drink at least 1 glass of water with each meal and aim for at least 8 glasses per day  Exercise at least 150 minutes every week.    Preventive Care 90 Years and Older, Male Preventive care refers to lifestyle choices and visits with your health care provider that can promote health and wellness. Preventive care visits are also called wellness exams. What can I expect for my preventive care visit? Counseling During your preventive care visit, your health care provider may ask about your: Medical history, including: Past medical problems. Family medical history. History of falls. Current health, including: Emotional well-being. Home life and relationship well-being. Sexual activity. Memory and ability to understand (cognition). Lifestyle,  including: Alcohol, nicotine or tobacco, and drug use. Access to firearms. Diet, exercise, and sleep habits. Work and work Statistician. Sunscreen use. Safety issues such as seatbelt and bike helmet use. Physical exam Your health care provider will check your: Height and weight. These may be used to calculate your BMI (body mass index). BMI is a measurement that tells if you are at a healthy weight. Waist circumference. This measures the distance around your waistline. This measurement also tells if you are at a healthy weight and may help predict your risk of certain diseases, such as type 2 diabetes and high blood pressure. Heart rate and blood pressure. Body temperature. Skin for abnormal spots. What immunizations do I need? Vaccines are usually given at various ages, according to a schedule. Your health care provider will recommend vaccines for you based on your age, medical history, and lifestyle or other factors, such as travel or where you work. What tests do I need? Screening Your health care provider may recommend screening tests for certain conditions. This may include: Lipid and cholesterol levels. Diabetes screening. This is done by checking your blood sugar (glucose) after you have not eaten for a while (fasting). Hepatitis C test. Hepatitis B test. HIV (human immunodeficiency virus) test. STI (sexually transmitted infection) testing, if you are at risk. Lung cancer screening. Colorectal cancer screening. Prostate cancer screening. Abdominal aortic aneurysm (AAA) screening. You may need this if you are a current or former  smoker. Talk with your health care provider about your test results, treatment options, and if necessary, the need for more tests. Follow these instructions at home: Eating and drinking  Eat a diet that includes fresh fruits and vegetables, whole grains, lean protein, and low-fat dairy products. Limit your intake of foods with high amounts of sugar,  saturated fats, and salt. Take vitamin and mineral supplements as recommended by your health care provider. Do not drink alcohol if your health care provider tells you not to drink. If you drink alcohol: Limit how much you have to 0-2 drinks a day. Know how much alcohol is in your drink. In the [place] one drink equals one 12 oz bottle of beer (355 mL), one 5 oz glass of wine (148 mL), or one 1 oz glass of hard liquor (44 mL). Lifestyle Brush your teeth every morning and night with fluoride toothpaste. Floss one time each day. Exercise for at least 30 minutes 5 or more days each week. Do not use any products that contain nicotine or tobacco. These products include cigarettes, chewing tobacco, and vaping devices, such as e-cigarettes. If you need help quitting, ask your health care provider. Do not use drugs. If you are sexually active, practice safe sex. Use a condom or other form of protection to prevent STIs. Take aspirin only as told by your health care provider. Make sure that you understand how much to take and what form to take. Work with your health care provider to find out whether it is safe and beneficial for you to take aspirin daily. Ask your health care provider if you need to take a cholesterol-lowering medicine (statin). Find healthy ways to manage stress, such as: Meditation, yoga, or listening to music. Journaling. Talking to a trusted person. Spending time with friends and family. Safety Always wear your seat belt while driving or riding in a vehicle. Do not drive: If you have been drinking alcohol. Do not ride with someone who has been drinking. When you are tired or distracted. While texting. If you have been using any mind-altering substances or drugs. Wear a helmet and other protective equipment during sports activities. If you have firearms in your house, make sure you follow all gun safety procedures. Minimize exposure to UV radiation to reduce your risk of skin  cancer. What's next? Visit your health care provider once a year for an annual wellness visit. Ask your health care provider how often you should have your eyes and teeth checked. Stay up to date on all vaccines. This information is not intended to replace advice given to you by your health care provider. Make sure you discuss any questions you have with your health care provider. Document Revised: 01/11/2021 Document Reviewed: 01/11/2021 Elsevier Patient Education  [place].

## 2021-09-21 NOTE — Assessment & Plan Note (Signed)
Check testosterone level.  We discussed pros and cons of replacement.  He is not yet sure if he would like to pursue this any further.  May need to follow-up with urology if he does wish to pursue testosterone placement.

## 2021-09-21 NOTE — Assessment & Plan Note (Addendum)
Has had 6 stonse since his last visit.  He has seen urologist for this.  He is not aware of any sort of stone analysis that has been done in the past.  He takes Flomax daily.  We will check urinalysis today.  He will let me know if he has any recurrence.  We will send in pocket prescription for Norco.  He uses this very sparingly as needed for flares.  He is aware of reasons to return to care.

## 2021-09-21 NOTE — Assessment & Plan Note (Signed)
At goal on metoprolol tartrate 12.5 mg twice daily and amlodipine 5 mg daily.

## 2021-09-21 NOTE — Assessment & Plan Note (Signed)
Check CMET. 

## 2021-09-21 NOTE — Assessment & Plan Note (Signed)
Check A1c. 

## 2021-09-21 NOTE — Progress Notes (Signed)
Chief Complaint:  Richard [name] is a 76 y.o. male who presents today for his annual comprehensive physical exam.    Assessment/Plan:  Chronic Problems Addressed Today: NEPHROLITHIASIS, HX OF Has had 6 stonse since his last visit.  He has seen urologist for this.  He is not aware of any sort of stone analysis that has been done in the past.  He takes Flomax daily.  We will check urinalysis today.  He will let me know if he has any recurrence.  We will send in pocket prescription for Norco.  He uses this very sparingly as needed for flares.  He is aware of reasons to return to care.  Fasting hyperglycemia Check A1c.   Essential hypertension At goal on metoprolol tartrate 12.5 mg twice daily and amlodipine 5 mg daily.  HYPERLIPIDEMIA Check lipids.  He is on Lipitor 20mg  four times weekly.   Low testosterone Check testosterone level.  We discussed pros and cons of replacement.  He is not yet sure if he would like to pursue this any further.  May need to follow-up with urology if he does wish to pursue testosterone placement.  CKD (chronic kidney disease) stage 3, GFR 30-59 ml/min Check CMET.   Onychomycosis Not bothersome to patient.  He deferred further treatment at this point.  Preventative Healthcare: Check labs. UTD on other vaccines and screenings. Deferred pneumonia vaccines. Check PSA.  Patient Counseling(The following topics were reviewed and/or handout was given):  -Nutrition: Stressed importance of moderation in sodium/caffeine intake, saturated fat and cholesterol, caloric balance, sufficient intake of fresh fruits, vegetables, and fiber.  -Stressed the importance of regular exercise.   -Substance Abuse: Discussed cessation/primary prevention of tobacco, alcohol, or other drug use; driving or other dangerous activities under the influence; availability of treatment for abuse.   -Injury prevention: Discussed safety belts, safety helmets, smoke detector, smoking near  bedding or upholstery.   -Sexuality: Discussed sexually transmitted diseases, partner selection, use of condoms, avoidance of unintended pregnancy and contraceptive alternatives.   -Dental health: Discussed importance of regular tooth brushing, flossing, and dental visits.  -Health maintenance and immunizations reviewed. Please refer to Health maintenance section.  Return to care in 1 year for next preventative visit.     Subjective:  HPI:  He has no acute complaints today.   He has had 6 kidney stones since last visit. Most recently was in last December. Had one painful kidney stone. He notes he did not saw any doctor during this time.He is following up with urologist for this issue. He has been having no energy and feeling tired. He would like to check his testosterone level today.  Lifestyle Diet: None specific. Exercise: lift weight 5 times a week and walk on treadmill.  Depression screen [facility] 2 2/9 09/11/2021  Decreased Interest 0  Down, Depressed, Hopeless 0  PHQ - 2 Score 0   ROS: Per HPI, otherwise a complete review of systems was negative.   PMH:  The following were reviewed and entered/updated in epic: Past Medical History:  Diagnosis Date   Aneurysm ([place])    left common iliac-stable   Arthritis    Fasting hyperglycemia    Follicular lymphoma ([facility]) dx'd 07/2018   History of kidney stones    Hyperlipidemia    LDL goal = < 100 based on NMR Lipoproprofile   Hypertension    Mass of left lower extremity    inner thigh   Nephrolithiasis    left    Renal insufficiency  Sepsis ([place]) 03/24/2013   Squamous cell carcinoma, face    Dr Sarajane Jews   Ureteral stone with hydronephrosis 03/24/2013   left   Patient Active Problem List   Diagnosis Date Noted   Low testosterone 07/05/2020   CKD (chronic kidney disease) stage 3, GFR 30-59 ml/min (HCC) 05/11/2019   Restless legs syndrome 05/11/2019   Intermittent low back pain 34/19/6222   Follicular lymphoma grade I of  intrapelvic lymph nodes ([place]) 12/16/2018   Erectile dysfunction 06/26/2017   Common iliac aneurysm ([place]) 08/01/2011   ADENOMATOUS COLONIC POLYP 03/08/2009   Fasting hyperglycemia 03/08/2009   HYPERLIPIDEMIA 09/02/2007   Essential hypertension 09/02/2007   NEPHROLITHIASIS, HX OF 09/02/2007   Past Surgical History:  Procedure Laterality Date   CATARACT EXTRACTION  2005   OD    COLONOSCOPY W/ POLYPECTOMY  2007   adenoma X2 ; [facility]   CYSTOSCOPY W/ URETERAL STENT PLACEMENT Left 03/23/2013   Procedure: CYSTOSCOPY WITH RETROGRADE PYELOGRAM/URETERAL STENT PLACEMENT;  Surgeon: Fredricka Bonine, MD;  Location: [facility];  Service: Urology;  Laterality: Left;   CYSTOSCOPY WITH RETROGRADE PYELOGRAM, URETEROSCOPY AND STENT PLACEMENT Bilateral 07/15/2018   Procedure: CYSTOSCOPY WITH RETROGRADE PYELOGRAM, URETEROSCOPY;  Surgeon: Irine Seal, MD;  Location: [facility];  Service: Urology;  Laterality: Bilateral;   CYSTOSCOPY WITH STENT PLACEMENT Left 07/15/2018   Procedure: CYSTOSCOPY WITH STENT PLACEMENT;  Surgeon: Irine Seal, MD;  Location: [facility];  Service: Urology;  Laterality: Left;   CYSTOSCOPY WITH URETEROSCOPY  02/28/2012   Procedure: CYSTOSCOPY WITH URETEROSCOPY;  Surgeon: Malka So, MD;  Location: [facility];  Service: Urology;  Laterality: Left;   CYSTOSCOPY/URETEROSCOPY/HOLMIUM LASER/STENT PLACEMENT Right 07/31/2018   Procedure: CYSTOSCOPY STENT REMOVAL  RIGHT URETEROSCOPY WITH HOLMIUM LASER POSSIBLE STENT PLACEMENT;  Surgeon: Irine Seal, MD;  Location[facility];  Service: Urology;  Laterality: Right;   HOLMIUM LASER APPLICATION Left 04/06/9891   Procedure: HOLMIUM LASER APPLICATION;  Surgeon: Malka So, MD;  Location: [facility];  Service: Urology;  Laterality: Left;   HOLMIUM LASER APPLICATION Right 11/94/1740   Procedure: HOLMIUM LASER APPLICATION;  Surgeon: Irine Seal, MD;  Location: [facility];  Service: Urology;  Laterality: Right;   INGUINAL LYMPH NODE BIOPSY Left  09/16/2018   Procedure: LEFT INGUINAL LYMPH NODE BIOPSY;  Surgeon: Erroll Luna, MD;  Location: [facility];  Service: General;  Laterality: Left;   LITHOTRIPSY      X2 w/o benefit   NEPHROLITHOTOMY  02/28/2012   Procedure: NEPHROLITHOTOMY PERCUTANEOUS;  Surgeon: Malka So, MD;  Location: [facility];  Service: Urology;  Laterality: Left;   TONSILLECTOMY     URETEROSCOPY Left 03/31/2013   Procedure: URETEROSCOPY;  Surgeon: Malka So, MD;  Location: [facility];  Service: Urology;  Laterality: Left;   VASECTOMY      Family History  Problem Relation Age of Onset   Heart attack Father 44        CBAG X5 vessels   Hyperlipidemia Father    Hypertension Father    Heart disease Father        before age 64   Hypertension Brother    Aortic aneurysm Paternal Uncle        AAA ruptured   Heart disease Mother    Hyperlipidemia Mother    Hypertension Mother    Diabetes Neg Hx     Medications- reviewed and updated Current Outpatient Medications  Medication Sig Dispense Refill   baclofen (LIORESAL) 10 MG tablet TAKE 1 TABLET BY  MOUTH THREE TIMES A DAY AS NEEDED FOR MUSCLE SPASMS 30 tablet 5   ferrous sulfate 324 MG TBEC Take 324 mg by mouth.     HYDROcodone-acetaminophen (NORCO) 5-325 MG tablet Take 1 tablet by mouth every 8 (eight) hours as needed for up to 5 days for moderate pain. 15 tablet 0   metoprolol tartrate (LOPRESSOR) 25 MG tablet TAKE 1/2 TABLET BY MOUTH TWICE A DAY 90 tablet 3   amLODipine (NORVASC) 5 MG tablet Take 1 tablet (5 mg total) by mouth daily. 90 tablet 3   atorvastatin (LIPITOR) 20 MG tablet TAKE 1 TABLET BY MOUTH ON MON, WED, FRI, AND SUN 51 tablet 3   tamsulosin (FLOMAX) 0.4 MG CAPS capsule Take 1 capsule (0.4 mg total) by mouth daily. 90 capsule 3   No current facility-administered medications for this visit.    Allergies-reviewed and updated Allergies  Allergen Reactions   Angiotensin Receptor Blockers     Angioedema with ACE-I   Ciprofloxacin      Rash on combination of Cipro & Pyridium after cystoscopic & open resection of renal calculi   Pyridium [Phenazopyridine]     In combo with Cipro    Social History   Socioeconomic History   Marital status: Married    Spouse name: Not on file   Number of children: 2   Years of education: 13   Highest education level: Not on file  Occupational History   Occupation: Facilities manager (retired)    Fish farm manager: [employer]  Tobacco Use   Smoking status: Never   Smokeless tobacco: Never  Vaping Use   Vaping Use: Never used  Substance and Sexual Activity   Alcohol use: Yes    Comment:  rarely   Drug use: No   Sexual activity: Not on file  Other Topics Concern   Not on file  Social History Narrative   Fun: Work around the house and work out.    Social Determinants of Health   Financial Resource Strain: Low Risk    Difficulty of Paying Living Expenses: Not hard at all  Food Insecurity: No Food Insecurity   Worried About Charity fundraiser in the Last Year: Never true   [place] in the Last Year: Never true  Transportation Needs: No Transportation Needs   Lack of Transportation (Medical): No   Lack of Transportation (Non-Medical): No  Physical Activity: Sufficiently Active   Days of Exercise per Week: 5 days   Minutes of Exercise per Session: 90 min  Stress: No Stress Concern Present   Feeling of Stress : Not at all  Social Connections: Moderately Isolated   Frequency of Communication with Friends and Family: Twice a week   Frequency of Social Gatherings with Friends and Family: Twice a week   Attends Religious Services: Never   Marine scientist or Organizations: No   Attends Music therapist: Never   Marital Status: Married        Objective:  Physical Exam: BP 124/73 (BP Location: Left Arm)    Pulse 61    Temp 98.1 F (36.7 C) (Temporal)    Ht 6\' 1"  (1.854 m)    Wt 183 lb 12.8 oz (83.4 kg)    SpO2 97%    BMI 24.25 kg/m   Body mass index is  24.25 kg/m. Wt Readings from Last 3 Encounters:  09/21/21 183 lb 12.8 oz (83.4 kg)  09/11/21 183 lb 12.8 oz (83.4 kg)  02/02/21 191 lb (  86.6 kg)  Gen: NAD, resting comfortably HEENT: TMs normal bilaterally. OP clear. No thyromegaly noted.  CV: RRR with no murmurs appreciated Pulm: NWOB, CTAB with no crackles, wheezes, or rhonchi GI: Normal bowel sounds present. Soft, Nontender, Nondistended. MSK: no edema, cyanosis, or clubbing noted Skin: warm, dry Neuro: CN2-12 grossly intact. Strength 5/5 in upper and lower extremities. Reflexes symmetric and intact bilaterally.  Psych: Normal affect and thought content      I,Savera Zaman,acting as a scribe for Dimas Chyle, MD.,have documented all relevant documentation on the behalf of Dimas Chyle, MD,as directed by  Dimas Chyle, MD while in the presence of Dimas Chyle, MD.   I, Dimas Chyle, MD, have reviewed all documentation for this visit. The documentation on 09/21/21 for the exam, diagnosis, procedures, and orders are all accurate and complete.  Algis Greenhouse. Jerline Pain, MD 09/21/2021 2:18 PM

## 2021-09-25 NOTE — Progress Notes (Signed)
Please inform patient of the following:  He has a little blood in his urine but this is to be expected with his kidney stones.  No crystals were identified in his urine sample.  His testosterone is low but stable.  His blood sugar is borderline elevated.  Everything else is normal.  Do not need to change treatment plan at this time.  We can refer him to urology if he would like to discuss testosterone placement.  We can recheck everything in a year.

## 2021-10-10 ENCOUNTER — Other Ambulatory Visit: Payer: Self-pay | Admitting: Family Medicine

## 2021-10-18 ENCOUNTER — Telehealth: Payer: Self-pay

## 2021-10-18 ENCOUNTER — Other Ambulatory Visit: Payer: Self-pay

## 2021-10-18 MED ORDER — TAMSULOSIN HCL 0.4 MG PO CAPS: ORAL_CAPSULE | ORAL | 3 refills | Status: DC

## 2021-10-18 NOTE — Telephone Encounter (Signed)
MEDICATION: tamsulosin (FLOMAX) 0.4 MG CAPS capsule ? ? ?PHARMACY:  [facility] [id number] [place] ? ?Comments: Patient states he no longer uses [facility]. Patient also wants it to be 90 days!  ? ?**Let patient know to contact pharmacy at the end of the day to make sure medication is ready. ** ? ?** Please notify patient to allow 48-72 hours to process** ? ?**Encourage patient to contact the pharmacy for refills or they can request refills through [facility]** ? ?

## 2021-10-18 NOTE — Telephone Encounter (Signed)
Rx sent to correct pharmacy.

## 2021-11-22 DIAGNOSIS — N200 Calculus of kidney: Secondary | ICD-10-CM | POA: Diagnosis not present

## 2021-11-22 DIAGNOSIS — R310 Gross hematuria: Secondary | ICD-10-CM | POA: Diagnosis not present

## 2021-11-22 DIAGNOSIS — E349 Endocrine disorder, unspecified: Secondary | ICD-10-CM | POA: Diagnosis not present

## 2021-12-18 DIAGNOSIS — N200 Calculus of kidney: Secondary | ICD-10-CM | POA: Diagnosis not present

## 2021-12-18 DIAGNOSIS — R319 Hematuria, unspecified: Secondary | ICD-10-CM | POA: Diagnosis not present

## 2021-12-18 DIAGNOSIS — R310 Gross hematuria: Secondary | ICD-10-CM | POA: Diagnosis not present

## 2021-12-18 DIAGNOSIS — I700 Atherosclerosis of aorta: Secondary | ICD-10-CM | POA: Diagnosis not present

## 2022-01-22 ENCOUNTER — Telehealth: Payer: Self-pay | Admitting: Hematology

## 2022-01-22 NOTE — Telephone Encounter (Signed)
Left message with rescheduled upcoming appointment due to provider's PAL. 

## 2022-01-26 DIAGNOSIS — Z961 Presence of intraocular lens: Secondary | ICD-10-CM | POA: Diagnosis not present

## 2022-01-26 DIAGNOSIS — H524 Presbyopia: Secondary | ICD-10-CM | POA: Diagnosis not present

## 2022-01-26 DIAGNOSIS — H02403 Unspecified ptosis of bilateral eyelids: Secondary | ICD-10-CM | POA: Diagnosis not present

## 2022-01-26 DIAGNOSIS — H2512 Age-related nuclear cataract, left eye: Secondary | ICD-10-CM | POA: Diagnosis not present

## 2022-02-02 ENCOUNTER — Inpatient Hospital Stay: Payer: Medicare Other | Admitting: Hematology

## 2022-02-02 ENCOUNTER — Inpatient Hospital Stay: Payer: Medicare Other

## 2022-02-23 ENCOUNTER — Other Ambulatory Visit: Payer: Self-pay | Admitting: *Deleted

## 2022-02-23 DIAGNOSIS — C8295 Follicular lymphoma, unspecified, lymph nodes of inguinal region and lower limb: Secondary | ICD-10-CM

## 2022-02-27 ENCOUNTER — Inpatient Hospital Stay: Payer: Medicare Other | Admitting: Hematology

## 2022-02-27 ENCOUNTER — Other Ambulatory Visit: Payer: Self-pay

## 2022-02-27 ENCOUNTER — Inpatient Hospital Stay: Payer: Medicare Other | Attending: Hematology

## 2022-02-27 VITALS — BP 136/82 | HR 60 | Temp 98.1°F | Resp 17 | Ht 73.0 in | Wt 181.5 lb

## 2022-02-27 DIAGNOSIS — Z79899 Other long term (current) drug therapy: Secondary | ICD-10-CM | POA: Diagnosis not present

## 2022-02-27 DIAGNOSIS — C8295 Follicular lymphoma, unspecified, lymph nodes of inguinal region and lower limb: Secondary | ICD-10-CM

## 2022-02-27 DIAGNOSIS — C8205 Follicular lymphoma grade I, lymph nodes of inguinal region and lower limb: Secondary | ICD-10-CM | POA: Diagnosis not present

## 2022-02-27 DIAGNOSIS — D696 Thrombocytopenia, unspecified: Secondary | ICD-10-CM | POA: Diagnosis not present

## 2022-02-27 LAB — CBC WITH DIFFERENTIAL (CANCER CENTER ONLY)
Abs Immature Granulocytes: 0.02 10*3/uL (ref 0.00–0.07)
Basophils Absolute: 0 10*3/uL (ref 0.0–0.1)
Basophils Relative: 1 %
Eosinophils Absolute: 0.1 10*3/uL (ref 0.0–0.5)
Eosinophils Relative: 2 %
HCT: 45.1 % (ref 39.0–52.0)
Hemoglobin: 14.8 g/dL (ref 13.0–17.0)
Immature Granulocytes: 0 %
Lymphocytes Relative: 22 %
Lymphs Abs: 1 10*3/uL (ref 0.7–4.0)
MCH: 27.9 pg (ref 26.0–34.0)
MCHC: 32.8 g/dL (ref 30.0–36.0)
MCV: 84.9 fL (ref 80.0–100.0)
Monocytes Absolute: 0.4 10*3/uL (ref 0.1–1.0)
Monocytes Relative: 9 %
Neutro Abs: 3.1 10*3/uL (ref 1.7–7.7)
Neutrophils Relative %: 66 %
Platelet Count: 129 10*3/uL — ABNORMAL LOW (ref 150–400)
RBC: 5.31 MIL/uL (ref 4.22–5.81)
RDW: 14.6 % (ref 11.5–15.5)
WBC Count: 4.7 10*3/uL (ref 4.0–10.5)
nRBC: 0 % (ref 0.0–0.2)

## 2022-02-27 LAB — CMP (CANCER CENTER ONLY)
ALT: 20 U/L (ref 0–44)
AST: 23 U/L (ref 15–41)
Albumin: 4.2 g/dL (ref 3.5–5.0)
Alkaline Phosphatase: 56 U/L (ref 38–126)
Anion gap: 5 (ref 5–15)
BUN: 29 mg/dL — ABNORMAL HIGH (ref 8–23)
CO2: 30 mmol/L (ref 22–32)
Calcium: 9.2 mg/dL (ref 8.9–10.3)
Chloride: 105 mmol/L (ref 98–111)
Creatinine: 1.27 mg/dL — ABNORMAL HIGH (ref 0.61–1.24)
GFR, Estimated: 59 mL/min — ABNORMAL LOW (ref >60)
Glucose, Bld: 105 mg/dL — ABNORMAL HIGH (ref 70–99)
Potassium: 4.4 mmol/L (ref 3.5–5.1)
Sodium: 140 mmol/L (ref 135–145)
Total Bilirubin: 1 mg/dL (ref 0.3–1.2)
Total Protein: 6.3 g/dL — ABNORMAL LOW (ref 6.5–8.1)

## 2022-02-27 LAB — LACTATE DEHYDROGENASE: LDH: 143 U/L (ref 98–192)

## 2022-03-02 ENCOUNTER — Telehealth: Payer: Self-pay | Admitting: Hematology

## 2022-03-02 NOTE — Telephone Encounter (Signed)
Scheduled follow-up appointment per 8/1 los. Patient is aware.

## 2022-03-06 NOTE — Progress Notes (Signed)
HEMATOLOGY/ONCOLOGY CLINIC NOTE  Date of Service: 02/27/2022   Patient Care Team: Vivi Barrack, MD as PCP - General (Family Medicine) Brunetta Genera, MD as Consulting Physician (Hematology) Irine Seal, MD as Attending Physician (Urology) Dyke Maes, OD as Consulting Physician (Optometry) Madelin Rear, [facility] as Pharmacist (Pharmacist)  CHIEF COMPLAINTS/PURPOSE OF CONSULTATION:  Follow-up for continued evaluation and management of follicular lymphoma  HISTORY OF PRESENTING ILLNESS:   Richard [name] is a wonderful 76 y.o. male who has been referred to [place] by Dr. Brantley Stage for evaluation and management of low grade follicular lymphoma. He is being accompanied by his wife, Velva Harman today. He notes that he first noticed a lump to his left inner thigh during a shower approximately 3-4 years ago and noted that it increased in size over the past couple of months.   He had an US of the LLE on 07/16/2018 showed: Soft tissue masses in the left inguinal region most compatible with enlarged lymph nodes/adenopathy, the largest measuring up to 6.1 cm.  On 08/20/2018, the patient underwent cytometry that showed: Tissue-Flow Cytometry with monoclonal B-cell population identified.   Lymph node biopsy on 08/20/2018 showed: Lymph node, needle/core biopsy, Left Inguinal with atypical lymphoid proliferation.   On 09/16/2018, the patient had biopsy completed with pathology showing: Lymph node for lymphoma, left inguinal with low grade follicular lymphoma.   PMHx, he has a hx of squamous cell carcinoma to bilateral sides of his face, none that is active.   SHx, he has had several cystoscopies within the past 4 months and his urologist is   On review of systems, he reports additional left inguinal lump x 4-5 months, intentional weight loss, recurrent kidney stones (most recently December 2019). he denies fever, chills, night sweats, no other areas of concerns, leg swelling, abdominal pain, flank pain,  testicular pain/swelling, rashes, and any other symptoms. He hasn't had his prior stones analyzed to determine the cause of them. He has intentionally lost 45 lbs in the past couple of weeks through keto diet and intermittent fasting. He doesn't consume bread or pasta. He works out with [employer] about every other day. His PCP is Vivi Barrack, MD and his Urologist is Dr. Irine Seal. Pertinent positives are listed and detailed within the above HPI.   Interval History:   Richard [name] returns for continued valuation and management of his follicular lymphoma . He notes no acute new symptoms since his last clinic visit. No fevers no chills no night sweats no unexpected weight loss. No new lumps or bumps. Patient was having gross hematuria and was evaluated by [facility].  He subsequently had a CT of the abdomen and pelvis with Dr. Juanell Fairly done on 12/18/2021 which showed bilateral nephrolithiasis.  Also noted to have an incidental 1.1 cm left periaortic lymph node ?  Reactive lymph related to follicular lymphoma. He was recommended cystoscopy but wanted to hold off at this time but will continue follow-up with urology. Labs done today were discussed with the patient in detail.  MEDICAL HISTORY:  Past Medical History:  Diagnosis Date   Aneurysm ([place])    left common iliac-stable   Arthritis    Fasting hyperglycemia    Follicular lymphoma (HCC) dx'd 07/2018   History of kidney stones    Hyperlipidemia    LDL goal = < 100 based on NMR Lipoproprofile   Hypertension    Mass of left lower extremity    inner thigh   Nephrolithiasis  left    Renal insufficiency    Sepsis ([place]) 03/24/2013   Squamous cell carcinoma, face    Dr Sarajane Jews   Ureteral stone with hydronephrosis 03/24/2013   left    SURGICAL HISTORY: Past Surgical History:  Procedure Laterality Date   CATARACT EXTRACTION  2005   OD    COLONOSCOPY W/ POLYPECTOMY  2007   adenoma X2 ; [facility]   CYSTOSCOPY W/ URETERAL STENT  PLACEMENT Left 03/23/2013   Procedure: CYSTOSCOPY WITH RETROGRADE PYELOGRAM/URETERAL STENT PLACEMENT;  Surgeon: Fredricka Bonine, MD;  Location: [facility];  Service: Urology;  Laterality: Left;   CYSTOSCOPY WITH RETROGRADE PYELOGRAM, URETEROSCOPY AND STENT PLACEMENT Bilateral 07/15/2018   Procedure: CYSTOSCOPY WITH RETROGRADE PYELOGRAM, URETEROSCOPY;  Surgeon: Irine Seal, MD;  Location: [facility];  Service: Urology;  Laterality: Bilateral;   CYSTOSCOPY WITH STENT PLACEMENT Left 07/15/2018   Procedure: CYSTOSCOPY WITH STENT PLACEMENT;  Surgeon: Irine Seal, MD;  Location: [facility];  Service: Urology;  Laterality: Left;   CYSTOSCOPY WITH URETEROSCOPY  02/28/2012   Procedure: CYSTOSCOPY WITH URETEROSCOPY;  Surgeon: Malka So, MD;  Location: [facility];  Service: Urology;  Laterality: Left;   CYSTOSCOPY/URETEROSCOPY/HOLMIUM LASER/STENT PLACEMENT Right 07/31/2018   Procedure: CYSTOSCOPY STENT REMOVAL  RIGHT URETEROSCOPY WITH HOLMIUM LASER POSSIBLE STENT PLACEMENT;  Surgeon: Irine Seal, MD;  [facility];  Service: Urology;  Laterality: Right;   HOLMIUM LASER APPLICATION Left 11/29/9765   Procedure: HOLMIUM LASER APPLICATION;  Surgeon: Malka So, MD;  Location: [facility];  Service: Urology;  Laterality: Left;   HOLMIUM LASER APPLICATION Right 34/19/3790   Procedure: HOLMIUM LASER APPLICATION;  Surgeon: Irine Seal, MD;  Location: [facility];  Service: Urology;  Laterality: Right;   INGUINAL LYMPH NODE BIOPSY Left 09/16/2018   Procedure: LEFT INGUINAL LYMPH NODE BIOPSY;  Surgeon: Erroll Luna, MD;  Location: [facility];  Service: General;  Laterality: Left;   LITHOTRIPSY      X2 w/o benefit   NEPHROLITHOTOMY  02/28/2012   Procedure: NEPHROLITHOTOMY PERCUTANEOUS;  Surgeon: Malka So, MD;  Location: [facility];  Service: Urology;  Laterality: Left;   TONSILLECTOMY     URETEROSCOPY Left 03/31/2013   Procedure: URETEROSCOPY;  Surgeon: Malka So, MD;  Location: [facility];  Service:  Urology;  Laterality: Left;   VASECTOMY      SOCIAL HISTORY: Social History   Socioeconomic History   Marital status: Married    Spouse name: Not on file   Number of children: 2   Years of education: 13   Highest education level: Not on file  Occupational History   Occupation: Facilities manager (retired)    Fish farm manager: [employer]  Tobacco Use   Smoking status: Never   Smokeless tobacco: Never  Vaping Use   Vaping Use: Never used  Substance and Sexual Activity   Alcohol use: Yes    Comment:  rarely   Drug use: No   Sexual activity: Not on file  Other Topics Concern   Not on file  Social History Narrative   Fun: Work around the house and work out.    Social Determinants of Health   Financial Resource Strain: Low Risk  (09/11/2021)   Overall Financial Resource Strain (CARDIA)    Difficulty of Paying Living Expenses: Not hard at all  Food Insecurity: No Food Insecurity (09/11/2021)   Hunger Vital Sign    Worried About Running Out of Food in the Last Year: Never true    Ran Out of  Food in the Last Year: Never true  Transportation Needs: No Transportation Needs (09/11/2021)   PRAPARE - Hydrologist (Medical): No    Lack of Transportation (Non-Medical): No  Physical Activity: Sufficiently Active (09/11/2021)   Exercise Vital Sign    Days of Exercise per Week: 5 days    Minutes of Exercise per Session: 90 min  Stress: No Stress Concern Present (09/11/2021)   [place]    Feeling of Stress : Not at all  Social Connections: Moderately Isolated (09/11/2021)   Social Connection and Isolation Panel [NHANES]    Frequency of Communication with Friends and Family: Twice a week    Frequency of Social Gatherings with Friends and Family: Twice a week    Attends Religious Services: Never    Marine scientist or Organizations: No    Attends Archivist Meetings: Never     Marital Status: Married  Human resources officer Violence: Not At Risk (09/11/2021)   Humiliation, Afraid, Rape, and Kick questionnaire    Fear of Current or Ex-Partner: No    Emotionally Abused: No    Physically Abused: No    Sexually Abused: No    FAMILY HISTORY: Family History  Problem Relation Age of Onset   Heart attack Father 54        CBAG X5 vessels   Hyperlipidemia Father    Hypertension Father    Heart disease Father        before age 61   Hypertension Brother    Aortic aneurysm Paternal Uncle        AAA ruptured   Heart disease Mother    Hyperlipidemia Mother    Hypertension Mother    Diabetes Neg Hx     ALLERGIES:  is allergic to angiotensin receptor blockers, ciprofloxacin, and pyridium [phenazopyridine].  MEDICATIONS:  Current Outpatient Medications  Medication Sig Dispense Refill   amLODipine (NORVASC) 5 MG tablet Take 1 tablet (5 mg total) by mouth daily. 90 tablet 3   atorvastatin (LIPITOR) 20 MG tablet TAKE 1 TABLET BY MOUTH ON MON, WED, FRI, AND SUN 51 tablet 3   baclofen (LIORESAL) 10 MG tablet TAKE 1 TABLET BY MOUTH THREE TIMES A DAY AS NEEDED FOR MUSCLE SPASMS 30 tablet 5   ferrous sulfate 324 MG TBEC Take 324 mg by mouth.     metoprolol tartrate (LOPRESSOR) 25 MG tablet TAKE 1/2 TABLET BY MOUTH TWICE A DAY 90 tablet 3   tamsulosin (FLOMAX) 0.4 MG CAPS capsule TAKE 1 CAPSULE BY MOUTH EVERYDAY AT BEDTIME 90 capsule 3   No current facility-administered medications for this visit.    REVIEW OF SYSTEMS:   Stools  PHYSICAL EXAMINATION: ECOG FS:1 - Symptomatic but completely ambulatory  .BP 136/82 (BP Location: Left Arm, Patient Position: Sitting)   Pulse 60   Temp 98.1 F (36.7 C) (Temporal)   Resp 17   Ht '6\' 1"'$  (1.854 m)   Wt 181 lb 8 oz (82.3 kg)   SpO2 100%   BMI 23.95 kg/m   Vitals:   02/27/22 1144  BP: 136/82  Pulse: 60  Resp: 17  Temp: 98.1 F (36.7 C)  SpO2: 100%   Wt Readings from Last 3 Encounters:  02/27/22 181 lb 8 oz (82.3 kg)   09/21/21 183 lb 12.8 oz (83.4 kg)  09/11/21 183 lb 12.8 oz (83.4 kg)   Body mass index is 23.95 kg/m.   NAD GENERAL:alert, in no  acute distress and comfortable SKIN: no acute rashes, no significant lesions EYES: conjunctiva are pink and non-injected, sclera anicteric OROPHARYNX: MMM, no exudates, no oropharyngeal erythema or ulceration NECK: supple, no JVD LYMPH:  no palpable lymphadenopathy in the cervical, axillary or inguinal regions LUNGS: clear to auscultation b/l with normal respiratory effort HEART: regular rate & rhythm ABDOMEN:  normoactive bowel sounds , non tender, not distended. Extremity: no pedal edema PSYCH: alert & oriented x 3 with fluent speech NEURO: no focal motor/sensory deficits  LABORATORY DATA:  I have reviewed the data as listed  .    Latest Ref Rng & Units 02/27/2022   11:37 AM 09/21/2021    2:21 PM 02/02/2021    1:51 PM  CBC  WBC 4.0 - 10.5 K/uL 4.7  5.1  6.2   Hemoglobin 13.0 - 17.0 g/dL 14.8  14.6  15.1   Hematocrit 39.0 - 52.0 % 45.1  45.3  47.0   Platelets 150 - 400 K/uL 129  150.0  163     .    Latest Ref Rng & Units 02/27/2022   11:37 AM 09/21/2021    2:21 PM 02/02/2021    1:51 PM  CMP  Glucose 70 - 99 mg/dL 105  100  96   BUN 8 - 23 mg/dL 29  33  36   Creatinine 0.61 - 1.24 mg/dL 1.27  1.43  1.41   Sodium 135 - 145 mmol/L 140  140  141   Potassium 3.5 - 5.1 mmol/L 4.4  4.3  4.9   Chloride 98 - 111 mmol/L 105  104  104   CO2 22 - 32 mmol/L '30  30  29   '$ Calcium 8.9 - 10.3 mg/dL 9.2  9.3  9.4   Total Protein 6.5 - 8.1 g/dL 6.3  6.2  6.5   Total Bilirubin 0.3 - 1.2 mg/dL 1.0  1.0  0.7   Alkaline Phos 38 - 126 U/L 56  59  67   AST 15 - 41 U/L '23  22  19   '$ ALT 0 - 44 U/L '20  20  19    '$ . Lab Results  Component Value Date   LDH 143 02/27/2022       08/10/2019 CT Abdomen Pelvis Wo Contrast (Accession [id number])      RADIOGRAPHIC STUDIES: I have personally reviewed the radiological images as listed and agreed with the findings in  the report. No results found.  ASSESSMENT & PLAN:   1. Low-grade follicular lymphoma- Stage I to II -First noticed a left inner thigh lump approximately 3-4 years ago and noted that it increased in size over the past couple of months.  -He had an US of the LLE on 07/16/2018 showed: Soft tissue masses in the left inguinal region most compatible with enlarged lymph nodes/adenopathy, the largest measuring up to 6.1 cm. 08/20/2018 Tissue-Flow Cytometry with monoclonal B-cell population identified.  08/20/2018 LN Biopsy revealed Left Inguinal with atypical lymphoid proliferation.  09/16/2018: Lymph node biopsy, left inguinal with low grade follicular lymphoma.   09/29/18 Hep B and Hep C negative 10/08/18 PET/CT revealed Left inguinal and external iliac hypermetabolic adenopathy, consistent with active lymphoma. ([place] [facility]). 2. No extrapelvic disease identified. 3. Coronary artery atherosclerosis. Aortic Atherosclerosis. 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 08/10/2019 CT Abdomen Pelvis Wo Contrast (Accession [id number]) which revealed "1. Interval response to therapy as evidenced by decrease in size of left external iliac and left inguinal adenopathy.  2. Bilateral renal stones. 3. Aortic atherosclerosis (ICD10-I70.0). 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 Labs done today show stable CBC and CMP.  Blood counts are stable other than mild thrombocytopenia of 129k He seems to have gross hematuria likely related to his nephrolithiasis but is following up with urology and has been recommended cystoscopy. Recent CT abdomen pelvis done at [facility] on 12/18/2021 was reviewed in detail with the patient and showed incidental finding of 1.1 cm left periaortic lymph node.  We discussed that this could be reactive due to his inflammation from kidney stones and  hematuria but also could suggest very early recurrence/progression of his low-grade follicular lymphoma. -At this time the patient has no clear indication for further evaluation and management of his low-grade follicular lymphoma at this time and does not have any symptoms related to this. We discussed getting a PET CT scan for further evaluation to evaluate this lymph node and any other evidence of disease.  FOLLOW UP: PET/CT in 110 months RTC with Dr Irene Limbo with labs in 11 months  .The total time spent in the appointment was 25 minutes* .  All of the patient's questions were answered with apparent satisfaction. The patient knows to call the clinic with any problems, questions or concerns.   Sullivan Lone MD MS AAHIVMS [employer] [facility] Hematology/Oncology Physician [facility]  .*Total Encounter Time as defined by the Centers for Medicare and Medicaid Services includes, in addition to the face-to-face time of a patient visit (documented in the note above) non-face-to-face time: obtaining and reviewing outside history, ordering and reviewing medications, tests or procedures, care coordination (communications with other health care professionals or caregivers) and documentation in the medical record.

## 2022-04-23 ENCOUNTER — Encounter: Payer: Self-pay | Admitting: *Deleted

## 2022-05-02 ENCOUNTER — Other Ambulatory Visit: Payer: Self-pay | Admitting: *Deleted

## 2022-05-02 ENCOUNTER — Telehealth: Payer: Self-pay | Admitting: Family Medicine

## 2022-05-02 MED ORDER — BACLOFEN 10 MG PO TABS: ORAL_TABLET | ORAL | 5 refills | Status: DC

## 2022-05-02 NOTE — Telephone Encounter (Signed)
Rx send to pharmacy  

## 2022-05-02 NOTE — Telephone Encounter (Signed)
.   Encourage patient to contact the pharmacy for refills or they can request refills through [place]:  Please schedule appointment if longer than 1 year  NEXT APPOINTMENT DATE:09/21/22  MEDICATION:baclofen (LIORESAL) 10 MG tablet   Is the patient out of medication? Yes   [facility]   Let patient know to contact pharmacy at the end of the day to make sure medication is ready.  Please notify patient to allow 48-72 hours to process

## 2022-07-12 ENCOUNTER — Encounter: Payer: Self-pay | Admitting: *Deleted

## 2022-07-31 ENCOUNTER — Other Ambulatory Visit: Payer: Self-pay | Admitting: Family Medicine

## 2022-09-21 ENCOUNTER — Encounter: Payer: Self-pay | Admitting: Family Medicine

## 2022-09-21 ENCOUNTER — Ambulatory Visit (INDEPENDENT_AMBULATORY_CARE_PROVIDER_SITE_OTHER): Payer: Medicare Other | Admitting: Family Medicine

## 2022-09-21 VITALS — BP 136/81 | HR 48 | Temp 97.8°F | Ht 73.0 in | Wt 181.8 lb

## 2022-09-21 DIAGNOSIS — C8206 Follicular lymphoma grade I, intrapelvic lymph nodes: Secondary | ICD-10-CM | POA: Diagnosis not present

## 2022-09-21 DIAGNOSIS — Z87442 Personal history of urinary calculi: Secondary | ICD-10-CM | POA: Diagnosis not present

## 2022-09-21 DIAGNOSIS — R7989 Other specified abnormal findings of blood chemistry: Secondary | ICD-10-CM

## 2022-09-21 DIAGNOSIS — N1831 Chronic kidney disease, stage 3a: Secondary | ICD-10-CM | POA: Diagnosis not present

## 2022-09-21 DIAGNOSIS — I10 Essential (primary) hypertension: Secondary | ICD-10-CM

## 2022-09-21 DIAGNOSIS — Z0001 Encounter for general adult medical examination with abnormal findings: Secondary | ICD-10-CM | POA: Diagnosis not present

## 2022-09-21 DIAGNOSIS — E782 Mixed hyperlipidemia: Secondary | ICD-10-CM | POA: Diagnosis not present

## 2022-09-21 DIAGNOSIS — R351 Nocturia: Secondary | ICD-10-CM

## 2022-09-21 DIAGNOSIS — R7301 Impaired fasting glucose: Secondary | ICD-10-CM

## 2022-09-21 LAB — LIPID PANEL
Cholesterol: 145 mg/dL (ref 0–200)
HDL: 72.3 mg/dL (ref >39.00)
LDL Cholesterol: 63 mg/dL (ref 0–99)
NonHDL: 73.03
Total CHOL/HDL Ratio: 2
Triglycerides: 51 mg/dL (ref 0.0–149.0)
VLDL: 10.2 mg/dL (ref 0.0–40.0)

## 2022-09-21 LAB — URINALYSIS, ROUTINE W REFLEX MICROSCOPIC
Bilirubin Urine: NEGATIVE
Hgb urine dipstick: NEGATIVE
Ketones, ur: NEGATIVE
Leukocytes,Ua: NEGATIVE
Nitrite: NEGATIVE
Specific Gravity, Urine: 1.025 (ref 1.000–1.030)
Total Protein, Urine: NEGATIVE
Urine Glucose: NEGATIVE
Urobilinogen, UA: 0.2 (ref 0.0–1.0)
pH: 5.5 (ref 5.0–8.0)

## 2022-09-21 LAB — COMPREHENSIVE METABOLIC PANEL
ALT: 29 U/L (ref 0–53)
AST: 26 U/L (ref 0–37)
Albumin: 4.3 g/dL (ref 3.5–5.2)
Alkaline Phosphatase: 71 U/L (ref 39–117)
BUN: 33 mg/dL — ABNORMAL HIGH (ref 6–23)
CO2: 30 mEq/L (ref 19–32)
Calcium: 9.6 mg/dL (ref 8.4–10.5)
Chloride: 103 mEq/L (ref 96–112)
Creatinine, Ser: 1.28 mg/dL (ref 0.40–1.50)
GFR: 55.02 mL/min — ABNORMAL LOW (ref >60.00)
Glucose, Bld: 91 mg/dL (ref 70–99)
Potassium: 4.7 mEq/L (ref 3.5–5.1)
Sodium: 141 mEq/L (ref 135–145)
Total Bilirubin: 1 mg/dL (ref 0.2–1.2)
Total Protein: 6.2 g/dL (ref 6.0–8.3)

## 2022-09-21 LAB — TESTOSTERONE: Testosterone: 256.99 ng/dL — ABNORMAL LOW (ref 300.00–890.00)

## 2022-09-21 LAB — CBC
HCT: 46.6 % (ref 39.0–52.0)
Hemoglobin: 15.1 g/dL (ref 13.0–17.0)
MCHC: 32.4 g/dL (ref 30.0–36.0)
MCV: 85 fl (ref 78.0–100.0)
Platelets: 143 10*3/uL — ABNORMAL LOW (ref 150.0–400.0)
RBC: 5.48 Mil/uL (ref 4.22–5.81)
RDW: 15 % (ref 11.5–15.5)
WBC: 4.5 10*3/uL (ref 4.0–10.5)

## 2022-09-21 LAB — TSH: TSH: 4.01 u[IU]/mL (ref 0.35–5.50)

## 2022-09-21 LAB — PSA: PSA: 1.75 ng/mL (ref 0.10–4.00)

## 2022-09-21 LAB — HEMOGLOBIN A1C: Hgb A1c MFr Bld: 5.7 % (ref 4.6–6.5)

## 2022-09-21 NOTE — Assessment & Plan Note (Signed)
Check lipids. He is on Lipitor 20 mg 4 times weekly.

## 2022-09-21 NOTE — Patient Instructions (Signed)
It was very nice to see you today!  We will check blood work today.  Please keep up the great work with your diet and exercise.  You probably have mild carpal tunnel syndrome.  Please let me know if your symptoms worsen.  I will see you back in year for your next physical.  Come back sooner if needed.  Take care, Dr Jerline Pain  PLEASE NOTE:  If you had any lab tests, please let us know if you have not heard back within a few days. You may see your results on mychart before we have a chance to review them but we will give you a call once they are reviewed by [place].   If we ordered any referrals today, please let us know if you have not heard from their office within the next week.   If you had any urgent prescriptions sent in today, please check with the pharmacy within an hour of our visit to make sure the prescription was transmitted appropriately.   Please try these tips to maintain a healthy lifestyle:  Eat at least 3 REAL meals and 1-2 snacks per day.  Aim for no more than 5 hours between eating.  If you eat breakfast, please do so within one hour of getting up.   Each meal should contain half fruits/vegetables, one quarter protein, and one quarter carbs (no bigger than a computer mouse)  Cut down on sweet beverages. This includes juice, soda, and sweet tea.   Drink at least 1 glass of water with each meal and aim for at least 8 glasses per day  Exercise at least 150 minutes every week.    Preventive Care 67 Years and Older, Male Preventive care refers to lifestyle choices and visits with your health care provider that can promote health and wellness. Preventive care visits are also called wellness exams. What can I expect for my preventive care visit? Counseling During your preventive care visit, your health care provider may ask about your: Medical history, including: Past medical problems. Family medical history. History of falls. Current health, including: Emotional  well-being. Home life and relationship well-being. Sexual activity. Memory and ability to understand (cognition). Lifestyle, including: Alcohol, nicotine or tobacco, and drug use. Access to firearms. Diet, exercise, and sleep habits. Work and work Statistician. Sunscreen use. Safety issues such as seatbelt and bike helmet use. Physical exam Your health care provider will check your: Height and weight. These may be used to calculate your BMI (body mass index). BMI is a measurement that tells if you are at a healthy weight. Waist circumference. This measures the distance around your waistline. This measurement also tells if you are at a healthy weight and may help predict your risk of certain diseases, such as type 2 diabetes and high blood pressure. Heart rate and blood pressure. Body temperature. Skin for abnormal spots. What immunizations do I need?  Vaccines are usually given at various ages, according to a schedule. Your health care provider will recommend vaccines for you based on your age, medical history, and lifestyle or other factors, such as travel or where you work. What tests do I need? Screening Your health care provider may recommend screening tests for certain conditions. This may include: Lipid and cholesterol levels. Diabetes screening. This is done by checking your blood sugar (glucose) after you have not eaten for a while (fasting). Hepatitis C test. Hepatitis B test. HIV (human immunodeficiency virus) test. STI (sexually transmitted infection) testing, if you are at risk.  Lung cancer screening. Colorectal cancer screening. Prostate cancer screening. Abdominal aortic aneurysm (AAA) screening. You may need this if you are a current or former smoker. Talk with your health care provider about your test results, treatment options, and if necessary, the need for more tests. Follow these instructions at home: Eating and drinking  Eat a diet that includes fresh fruits  and vegetables, whole grains, lean protein, and low-fat dairy products. Limit your intake of foods with high amounts of sugar, saturated fats, and salt. Take vitamin and mineral supplements as recommended by your health care provider. Do not drink alcohol if your health care provider tells you not to drink. If you drink alcohol: Limit how much you have to 0-2 drinks a day. Know how much alcohol is in your drink. In the [place] one drink equals one 12 oz bottle of beer (355 mL), one 5 oz glass of wine (148 mL), or one 1 oz glass of hard liquor (44 mL). Lifestyle Brush your teeth every morning and night with fluoride toothpaste. Floss one time each day. Exercise for at least 30 minutes 5 or more days each week. Do not use any products that contain nicotine or tobacco. These products include cigarettes, chewing tobacco, and vaping devices, such as e-cigarettes. If you need help quitting, ask your health care provider. Do not use drugs. If you are sexually active, practice safe sex. Use a condom or other form of protection to prevent STIs. Take aspirin only as told by your health care provider. Make sure that you understand how much to take and what form to take. Work with your health care provider to find out whether it is safe and beneficial for you to take aspirin daily. Ask your health care provider if you need to take a cholesterol-lowering medicine (statin). Find healthy ways to manage stress, such as: Meditation, yoga, or listening to music. Journaling. Talking to a trusted person. Spending time with friends and family. Safety Always wear your seat belt while driving or riding in a vehicle. Do not drive: If you have been drinking alcohol. Do not ride with someone who has been drinking. When you are tired or distracted. While texting. If you have been using any mind-altering substances or drugs. Wear a helmet and other protective equipment during sports activities. If you have firearms in  your house, make sure you follow all gun safety procedures. Minimize exposure to UV radiation to reduce your risk of skin cancer. What's next? Visit your health care provider once a year for an annual wellness visit. Ask your health care provider how often you should have your eyes and teeth checked. Stay up to date on all vaccines. This information is not intended to replace advice given to you by your health care provider. Make sure you discuss any questions you have with your health care provider. Document Revised: 01/11/2021 Document Reviewed: 01/11/2021 Elsevier Patient Education  [place].

## 2022-09-21 NOTE — Assessment & Plan Note (Signed)
Check CMET. 

## 2022-09-21 NOTE — Progress Notes (Signed)
Chief Complaint:  Richard [name] is a 77 y.o. male who presents today for his annual comprehensive physical exam.    Assessment/Plan:  New/Acute Problems: Carpal Tunnel Syndrome  Symptoms are currently mild and manageable.  We did discuss splinting and home exercises however he declined.  He will let me know if symptoms worsen.  Chronic Problems Addressed Today: HYPERLIPIDEMIA Check lipids. He is on Lipitor 20 mg 4 times weekly.  Essential hypertension Echo on amlodipine 5 mg daily and Metroprolol tartrate 12.5 mg twice daily.  Fasting hyperglycemia Check A1c.  NEPHROLITHIASIS, HX OF Recently had a flare and has follow-up with urology for this.  Take Flomax daily.  Symptoms are back to baseline.  Follicular lymphoma grade I of intrapelvic lymph nodes (HCC) Continue management per oncology  CKD (chronic kidney disease) stage 3, GFR 30-59 ml/min Check CMET.   Low testosterone Check testosterone.  He has not been interested in replacement therapy in the past.   Preventative Healthcare: Check labs.Up-to-date on colon cancer screening.  Due for cologuard to get next year.  Declined pneumonia vaccine.   Patient Counseling(The following topics were reviewed and/or handout was given):  -Nutrition: Stressed importance of moderation in sodium/caffeine intake, saturated fat and cholesterol, caloric balance, sufficient intake of fresh fruits, vegetables, and fiber.  -Stressed the importance of regular exercise.   -Substance Abuse: Discussed cessation/primary prevention of tobacco, alcohol, or other drug use; driving or other dangerous activities under the influence; availability of treatment for abuse.   -Injury prevention: Discussed safety belts, safety helmets, smoke detector, smoking near bedding or upholstery.   -Sexuality: Discussed sexually transmitted diseases, partner selection, use of condoms, avoidance of unintended pregnancy and contraceptive alternatives.   -Dental health:  Discussed importance of regular tooth brushing, flossing, and dental visits.  -Health maintenance and immunizations reviewed. Please refer to Health maintenance section.  Return to care in 1 year for next preventative visit.     Subjective:  HPI:  He has no acute complaints today. Se A/p for status of chronic conditions.   He had an episode of hematuria since our last visit. Saw urology and was confirmed to have nephrolithiasis.  He has passed a few kidney stones and hematuria has improved.  Some numbness on thumb the last week or so.  Located on the lateral aspect with some.  No obvious injuries.  No obvious aggravating or alleviating factors.  He has been working on the treadmill more thinks this may have brought on the symptoms.  Lifestyle Diet: Balanced. Plenty of fruits and vegetables. Cutting down on carbs.  Exercise: Lifts weight every other day. 6-8 miles per day on the treadmill.      09/21/2022   10:14 AM  Depression screen PHQ 2/9  Decreased Interest 0  Down, Depressed, Hopeless 0  PHQ - 2 Score 0    Health Maintenance Due  Topic Date Due   COVID-19 Vaccine (6 - 2023-24 season) 03/30/2022   Medicare Annual Wellness (AWV)  09/11/2022     ROS: Per HPI, otherwise a complete review of systems was negative.   PMH:  The following were reviewed and entered/updated in epic: Past Medical History:  Diagnosis Date   Aneurysm ([place])    left common iliac-stable   Arthritis    Fasting hyperglycemia    Follicular lymphoma (HCC) dx'd 07/2018   History of kidney stones    Hyperlipidemia    LDL goal = < 100 based on NMR Lipoproprofile   Hypertension    Mass  of left lower extremity    inner thigh   Nephrolithiasis    left    Renal insufficiency    Sepsis ([place]) 03/24/2013   Squamous cell carcinoma, face    Dr Sarajane Jews   Ureteral stone with hydronephrosis 03/24/2013   left   Patient Active Problem List   Diagnosis Date Noted   Low testosterone 07/05/2020   CKD  (chronic kidney disease) stage 3, GFR 30-59 ml/min (HCC) 05/11/2019   Restless legs syndrome 05/11/2019   Intermittent low back pain [id number]   Follicular lymphoma grade I of intrapelvic lymph nodes ([place]) 12/16/2018   Erectile dysfunction 06/26/2017   Common iliac aneurysm ([place]) 08/01/2011   ADENOMATOUS COLONIC POLYP 03/08/2009   Fasting hyperglycemia 03/08/2009   HYPERLIPIDEMIA 09/02/2007   Essential hypertension 09/02/2007   NEPHROLITHIASIS, HX OF 09/02/2007   Past Surgical History:  Procedure Laterality Date   CATARACT EXTRACTION  2005   OD    COLONOSCOPY W/ POLYPECTOMY  2007   adenoma X2 ; [facility]   CYSTOSCOPY W/ URETERAL STENT PLACEMENT Left 03/23/2013   Procedure: CYSTOSCOPY WITH RETROGRADE PYELOGRAM/URETERAL STENT PLACEMENT;  Surgeon: Fredricka Bonine, MD;  Location: [facility];  Service: Urology;  Laterality: Left;   CYSTOSCOPY WITH RETROGRADE PYELOGRAM, URETEROSCOPY AND STENT PLACEMENT Bilateral 07/15/2018   Procedure: CYSTOSCOPY WITH RETROGRADE PYELOGRAM, URETEROSCOPY;  Surgeon: Irine Seal, MD;  Location: [facility];  Service: Urology;  Laterality: Bilateral;   CYSTOSCOPY WITH STENT PLACEMENT Left 07/15/2018   Procedure: CYSTOSCOPY WITH STENT PLACEMENT;  Surgeon: Irine Seal, MD;  Location: [facility];  Service: Urology;  Laterality: Left;   CYSTOSCOPY WITH URETEROSCOPY  02/28/2012   Procedure: CYSTOSCOPY WITH URETEROSCOPY;  Surgeon: Malka So, MD;  Location: [facility];  Service: Urology;  Laterality: Left;   CYSTOSCOPY/URETEROSCOPY/HOLMIUM LASER/STENT PLACEMENT Right 07/31/2018   Procedure: CYSTOSCOPY STENT REMOVAL  RIGHT URETEROSCOPY WITH HOLMIUM LASER POSSIBLE STENT PLACEMENT;  Surgeon: Irine Seal, MD;  Location: [facility];  Service: Urology;  Laterality: Right;   HOLMIUM LASER APPLICATION Left [id number]   Procedure: HOLMIUM LASER APPLICATION;  Surgeon: Malka So, MD;  Location: [facility];  Service: Urology;  Laterality: Left;   HOLMIUM LASER APPLICATION Right  [id number]   Procedure: HOLMIUM LASER APPLICATION;  Surgeon: Irine Seal, MD;  Location: [facility];  Service: Urology;  Laterality: Right;   INGUINAL LYMPH NODE BIOPSY Left 09/16/2018   Procedure: LEFT INGUINAL LYMPH NODE BIOPSY;  Surgeon: Erroll Luna, MD;  Location: [facility];  Service: General;  Laterality: Left;   LITHOTRIPSY      X2 w/o benefit   NEPHROLITHOTOMY  02/28/2012   Procedure: NEPHROLITHOTOMY PERCUTANEOUS;  Surgeon: Malka So, MD;  Location: [facility];  Service: Urology;  Laterality: Left;   TONSILLECTOMY     URETEROSCOPY Left 03/31/2013   Procedure: URETEROSCOPY;  Surgeon: Malka So, MD;  Location: [facility];  Service: Urology;  Laterality: Left;   VASECTOMY      Family History  Problem Relation Age of Onset   Heart attack Father 77        CBAG X5 vessels   Hyperlipidemia Father    Hypertension Father    Heart disease Father        before age 51   Hypertension Brother    Aortic aneurysm Paternal Uncle        AAA ruptured   Heart disease Mother    Hyperlipidemia Mother    Hypertension Mother    Diabetes Neg Hx  Medications- reviewed and updated Current Outpatient Medications  Medication Sig Dispense Refill   amLODipine (NORVASC) 5 MG tablet Take 1 tablet (5 mg total) by mouth daily. 90 tablet 3   atorvastatin (LIPITOR) 20 MG tablet TAKE 1 TABLET BY MOUTH ON MON, WED, FRI, AND SUN 51 tablet 3   baclofen (LIORESAL) 10 MG tablet TAKE 1 TABLET BY MOUTH THREE TIMES A DAY AS NEEDED FOR MUSCLE SPASMS 30 tablet 5   ferrous sulfate 324 MG TBEC Take 324 mg by mouth.     metoprolol tartrate (LOPRESSOR) 25 MG tablet TAKE 1/2 TABLET BY MOUTH TWICE A DAY 90 tablet 1   tamsulosin (FLOMAX) 0.4 MG CAPS capsule TAKE 1 CAPSULE BY MOUTH EVERYDAY AT BEDTIME 90 capsule 3   No current facility-administered medications for this visit.    Allergies-reviewed and updated Allergies  Allergen Reactions   Angiotensin Receptor Blockers     Angioedema with ACE-I    Ciprofloxacin     Rash on combination of Cipro & Pyridium after cystoscopic & open resection of renal calculi   Pyridium [Phenazopyridine]     In combo with Cipro    Social History   Socioeconomic History   Marital status: Married    Spouse name: Not on file   Number of children: 2   Years of education: 13   Highest education level: Not on file  Occupational History   Occupation: Facilities manager (retired)    Fish farm manager: [employer]  Tobacco Use   Smoking status: Never   Smokeless tobacco: Never  Vaping Use   Vaping Use: Never used  Substance and Sexual Activity   Alcohol use: Yes    Comment:  rarely   Drug use: No   Sexual activity: Not on file  Other Topics Concern   Not on file  Social History Narrative   Fun: Work around the house and work out.    Social Determinants of Health   Financial Resource Strain: Low Risk  (09/11/2021)   Overall Financial Resource Strain (CARDIA)    Difficulty of Paying Living Expenses: Not hard at all  Food Insecurity: No Food Insecurity (09/11/2021)   Hunger Vital Sign    Worried About Running Out of Food in the Last Year: Never true    Ran Out of Food in the Last Year: Never true  Transportation Needs: No Transportation Needs (09/11/2021)   PRAPARE - Hydrologist (Medical): No    Lack of Transportation (Non-Medical): No  Physical Activity: Sufficiently Active (09/11/2021)   Exercise Vital Sign    Days of Exercise per Week: 5 days    Minutes of Exercise per Session: 90 min  Stress: No Stress Concern Present (09/11/2021)   [place]    Feeling of Stress : Not at all  Social Connections: Moderately Isolated (09/11/2021)   Social Connection and Isolation Panel [NHANES]    Frequency of Communication with Friends and Family: Twice a week    Frequency of Social Gatherings with Friends and Family: Twice a week    Attends Religious Services: Never     Marine scientist or Organizations: No    Attends Music therapist: Never    Marital Status: Married        Objective:  Physical Exam: BP 136/81   Pulse (!) 48   Temp 97.8 F (36.6 C) (Temporal)   Ht '6\' 1"'$  (1.854 m)   Wt 181 lb 12.8 oz (  82.5 kg)   SpO2 99%   BMI 23.99 kg/m   Body mass index is 23.99 kg/m. Wt Readings from Last 3 Encounters:  09/21/22 181 lb 12.8 oz (82.5 kg)  02/27/22 181 lb 8 oz (82.3 kg)  09/21/21 183 lb 12.8 oz (83.4 kg)   Gen: NAD, resting comfortably HEENT: TMs normal bilaterally. OP clear. No thyromegaly noted.  CV: RRR with no murmurs appreciated Pulm: NWOB, CTAB with no crackles, wheezes, or rhonchi GI: Normal bowel sounds present. Soft, Nontender, Nondistended. MSK: no edema, cyanosis, or clubbing noted - Hands: Joint hypertrophy noted throughout.  Tinel sign positive bilateral hands.  Neurovascular intact distally. Skin: warm, dry Neuro: CN2-12 grossly intact. Strength 5/5 in upper and lower extremities. Reflexes symmetric and intact bilaterally.  Psych: Normal affect and thought content     Ival Basquez M. Jerline Pain, MD 09/21/2022 11:00 AM

## 2022-09-21 NOTE — Assessment & Plan Note (Signed)
Check A1c. 

## 2022-09-21 NOTE — Assessment & Plan Note (Signed)
Echo on amlodipine 5 mg daily and Metroprolol tartrate 12.5 mg twice daily.

## 2022-09-21 NOTE — Assessment & Plan Note (Signed)
Recently had a flare and has follow-up with urology for this.  Take Flomax daily.  Symptoms are back to baseline.

## 2022-09-21 NOTE — Assessment & Plan Note (Signed)
Continue management per oncology. 

## 2022-09-21 NOTE — Assessment & Plan Note (Addendum)
Check testosterone.  He has not been interested in replacement therapy in the past.

## 2022-09-24 ENCOUNTER — Ambulatory Visit (INDEPENDENT_AMBULATORY_CARE_PROVIDER_SITE_OTHER): Payer: Medicare Other

## 2022-09-24 VITALS — Wt 175.0 lb

## 2022-09-24 DIAGNOSIS — Z0000 Encounter for general adult medical examination without abnormal findings: Secondary | ICD-10-CM | POA: Diagnosis not present

## 2022-09-24 NOTE — Patient Instructions (Signed)
Richard [name] , Thank you for taking time to come for your Medicare Wellness Visit. I appreciate your ongoing commitment to your health goals. Please review the following plan we discussed and let me know if I can assist you in the future.   These are the goals we discussed:  Goals      Patient Stated     None at this time     Patient Stated     Not getting weaker and increase strength      Patient Stated     None at this time         This is a list of the screening recommended for you and due dates:  Health Maintenance  Topic Date Due   Pneumonia Vaccine (1 of 2 - PCV) Never done   COVID-19 Vaccine (7 - 2023-24 season) 06/21/2022   Medicare Annual Wellness Visit  09/25/2023   Cologuard (Stool DNA test)  11/17/2023   DTaP/Tdap/Td vaccine (2 - Td or Tdap) 07/03/2025   Flu Shot  Completed   Hepatitis C Screening: USPSTF Recommendation to screen - Ages 18-79 yo.  Completed   Zoster (Shingles) Vaccine  Completed   HPV Vaccine  Aged Out    Advanced directives: Advance directive discussed with you today. Even though you declined this today please call our office should you change your mind and we can give you the proper paperwork for you to fill out.  Conditions/risks identified: none at this time   Next appointment: Follow up in one year for your annual wellness visit.   Preventive Care 75 Years and Older, Male  Preventive care refers to lifestyle choices and visits with your health care provider that can promote health and wellness. What does preventive care include? A yearly physical exam. This is also called an annual well check. Dental exams once or twice a year. Routine eye exams. Ask your health care provider how often you should have your eyes checked. Personal lifestyle choices, including: Daily care of your teeth and gums. Regular physical activity. Eating a healthy diet. Avoiding tobacco and drug use. Limiting alcohol use. Practicing safe sex. Taking low doses of  aspirin every day. Taking vitamin and mineral supplements as recommended by your health care provider. What happens during an annual well check? The services and screenings done by your health care provider during your annual well check will depend on your age, overall health, lifestyle risk factors, and family history of disease. Counseling  Your health care provider may ask you questions about your: Alcohol use. Tobacco use. Drug use. Emotional well-being. Home and relationship well-being. Sexual activity. Eating habits. History of falls. Memory and ability to understand (cognition). Work and work Statistician. Screening  You may have the following tests or measurements: Height, weight, and BMI. Blood pressure. Lipid and cholesterol levels. These may be checked every 5 years, or more frequently if you are over 42 years old. Skin check. Lung cancer screening. You may have this screening every year starting at age 37 if you have a 30-pack-year history of smoking and currently smoke or have quit within the past 15 years. Fecal occult blood test (FOBT) of the stool. You may have this test every year starting at age 41. Flexible sigmoidoscopy or colonoscopy. You may have a sigmoidoscopy every 5 years or a colonoscopy every 10 years starting at age 31. Prostate cancer screening. Recommendations will vary depending on your family history and other risks. Hepatitis C blood test. Hepatitis B blood test. Sexually  transmitted disease (STD) testing. Diabetes screening. This is done by checking your blood sugar (glucose) after you have not eaten for a while (fasting). You may have this done every 1-3 years. Abdominal aortic aneurysm (AAA) screening. You may need this if you are a current or former smoker. Osteoporosis. You may be screened starting at age 78 if you are at high risk. Talk with your health care provider about your test results, treatment options, and if necessary, the need for more  tests. Vaccines  Your health care provider may recommend certain vaccines, such as: Influenza vaccine. This is recommended every year. Tetanus, diphtheria, and acellular pertussis (Tdap, Td) vaccine. You may need a Td booster every 10 years. Zoster vaccine. You may need this after age 22. Pneumococcal 13-valent conjugate (PCV13) vaccine. One dose is recommended after age 39. Pneumococcal polysaccharide (PPSV23) vaccine. One dose is recommended after age 25. Talk to your health care provider about which screenings and vaccines you need and how often you need them. This information is not intended to replace advice given to you by your health care provider. Make sure you discuss any questions you have with your health care provider. Document Released: 08/12/2015 Document Revised: 04/04/2016 Document Reviewed: 05/17/2015 Elsevier Interactive Patient Education  2017 [employer] Prevention in the Home Falls can cause injuries. They can happen to people of all ages. There are many things you can do to make your home safe and to help prevent falls. What can I do on the outside of my home? Regularly fix the edges of walkways and driveways and fix any cracks. Remove anything that might make you trip as you walk through a door, such as a raised step or threshold. Trim any bushes or trees on the path to your home. Use bright outdoor lighting. Clear any walking paths of anything that might make someone trip, such as rocks or tools. Regularly check to see if handrails are loose or broken. Make sure that both sides of any steps have handrails. Any raised decks and porches should have guardrails on the edges. Have any leaves, snow, or ice cleared regularly. Use sand or salt on walking paths during winter. Clean up any spills in your garage right away. This includes oil or grease spills. What can I do in the bathroom? Use night lights. Install grab bars by the toilet and in the tub and shower.  Do not use towel bars as grab bars. Use non-skid mats or decals in the tub or shower. If you need to sit down in the shower, use a plastic, non-slip stool. Keep the floor dry. Clean up any water that spills on the floor as soon as it happens. Remove soap buildup in the tub or shower regularly. Attach bath mats securely with double-sided non-slip rug tape. Do not have throw rugs and other things on the floor that can make you trip. What can I do in the bedroom? Use night lights. Make sure that you have a light by your bed that is easy to reach. Do not use any sheets or blankets that are too big for your bed. They should not hang down onto the floor. Have a firm chair that has side arms. You can use this for support while you get dressed. Do not have throw rugs and other things on the floor that can make you trip. What can I do in the kitchen? Clean up any spills right away. Avoid walking on wet floors. Keep items that you use  a lot in easy-to-reach places. If you need to reach something above you, use a strong step stool that has a grab bar. Keep electrical cords out of the way. Do not use floor polish or wax that makes floors slippery. If you must use wax, use non-skid floor wax. Do not have throw rugs and other things on the floor that can make you trip. What can I do with my stairs? Do not leave any items on the stairs. Make sure that there are handrails on both sides of the stairs and use them. Fix handrails that are broken or loose. Make sure that handrails are as long as the stairways. Check any carpeting to make sure that it is firmly attached to the stairs. Fix any carpet that is loose or worn. Avoid having throw rugs at the top or bottom of the stairs. If you do have throw rugs, attach them to the floor with carpet tape. Make sure that you have a light switch at the top of the stairs and the bottom of the stairs. If you do not have them, ask someone to add them for you. What else  can I do to help prevent falls? Wear shoes that: Do not have high heels. Have rubber bottoms. Are comfortable and fit you well. Are closed at the toe. Do not wear sandals. If you use a stepladder: Make sure that it is fully opened. Do not climb a closed stepladder. Make sure that both sides of the stepladder are locked into place. Ask someone to hold it for you, if possible. Clearly mark and make sure that you can see: Any grab bars or handrails. First and last steps. Where the edge of each step is. Use tools that help you move around (mobility aids) if they are needed. These include: Canes. Walkers. Scooters. Crutches. Turn on the lights when you go into a dark area. Replace any light bulbs as soon as they burn out. Set up your furniture so you have a clear path. Avoid moving your furniture around. If any of your floors are uneven, fix them. If there are any pets around you, be aware of where they are. Review your medicines with your doctor. Some medicines can make you feel dizzy. This can increase your chance of falling. Ask your doctor what other things that you can do to help prevent falls. This information is not intended to replace advice given to you by your health care provider. Make sure you discuss any questions you have with your health care provider. Document Released: 05/12/2009 Document Revised: 12/22/2015 Document Reviewed: 08/20/2014 Elsevier Interactive Patient Education  2017 [employer].

## 2022-09-24 NOTE — Progress Notes (Signed)
I connected with  [name] on 09/24/22 by a audio enabled telemedicine application and verified that I am speaking with the correct person using two identifiers.  Patient Location: Home  Provider Location: Office/Clinic  I discussed the limitations of evaluation and management by telemedicine. The patient expressed understanding and agreed to proceed.   Subjective:   Richard [name] is a 77 y.o. male who presents for Medicare Annual/Subsequent preventive examination.  Review of Systems     Cardiac Risk Factors include: advanced age (>34mn, >>45women);hypertension;dyslipidemia;male gender     Objective:    Today's Vitals   09/24/22 1138  Weight: 175 lb (79.4 kg)   Body mass index is 23.09 kg/m.     09/24/2022   11:41 AM 09/11/2021   11:47 AM 09/05/2020    1:06 PM 02/10/2020    2:24 PM 08/13/2019    2:14 PM 04/24/2019   12:57 PM 12/16/2018   10:39 AM  Advanced Directives  Does Patient Have a Medical Advance Directive? No No No No No Yes No  Type of Advance Directive      Living will;Healthcare Power of Attorney   Does patient want to make changes to medical advance directive?      No - Patient declined   Copy of [place]?      No - copy requested   Would patient like information on creating a medical advance directive? No - Patient declined No - Patient declined No - Patient declined No - Patient declined No - Patient declined  No - Patient declined    Current Medications (verified) Outpatient Encounter Medications as of 09/24/2022  Medication Sig   ABRYSVO 120 MCG/0.5ML injection    amLODipine (NORVASC) 5 MG tablet Take 1 tablet (5 mg total) by mouth daily.   atorvastatin (LIPITOR) 20 MG tablet TAKE 1 TABLET BY MOUTH ON MON, WED, FRI, AND SUN   baclofen (LIORESAL) 10 MG tablet TAKE 1 TABLET BY MOUTH THREE TIMES A DAY AS NEEDED FOR MUSCLE SPASMS   COMIRNATY syringe    ferrous sulfate 324 MG TBEC Take 324 mg by mouth.   FLUZONE HIGH-DOSE  QUADRIVALENT 0.7 ML SUSY    metoprolol tartrate (LOPRESSOR) 25 MG tablet TAKE 1/2 TABLET BY MOUTH TWICE A DAY   Multiple Vitamin (MULTIVITAMIN) tablet Take 1 tablet by mouth daily.   tamsulosin (FLOMAX) 0.4 MG CAPS capsule TAKE 1 CAPSULE BY MOUTH EVERYDAY AT BEDTIME   No facility-administered encounter medications on file as of 09/24/2022.    Allergies (verified) Angiotensin receptor blockers, Ciprofloxacin, and Pyridium [phenazopyridine]   History: Past Medical History:  Diagnosis Date   Aneurysm ([facility]    left common iliac-stable   Arthritis    Fasting hyperglycemia    Follicular lymphoma (HCC) dx'd 07/2018   History of kidney stones    Hyperlipidemia    LDL goal = < 100 based on NMR Lipoproprofile   Hypertension    Mass of left lower extremity    inner thigh   Nephrolithiasis    left    Renal insufficiency    [place]   Squamous cell carcinoma, face    Dr GSarajane Jews  Ureteral stone with hydronephrosis 03/24/2013   left   Past Surgical History:  Procedure Laterality Date   CATARACT EXTRACTION  2005   OD    COLONOSCOPY W/ POLYPECTOMY  2007   adenoma X2 ; [facility]   CYSTOSCOPY W/ URETERAL STENT PLACEMENT Left 03/23/2013  Procedure: CYSTOSCOPY WITH RETROGRADE PYELOGRAM/URETERAL STENT PLACEMENT;  Surgeon: Fredricka Bonine, MD;  Location: [facility];  Service: Urology;  Laterality: Left;   CYSTOSCOPY WITH RETROGRADE PYELOGRAM, URETEROSCOPY AND STENT PLACEMENT Bilateral 07/15/2018   Procedure: CYSTOSCOPY WITH RETROGRADE PYELOGRAM, URETEROSCOPY;  Surgeon: Irine Seal, MD;  Location: [facility];  Service: Urology;  Laterality: Bilateral;   CYSTOSCOPY WITH STENT PLACEMENT Left 07/15/2018   Procedure: CYSTOSCOPY WITH STENT PLACEMENT;  Surgeon: Irine Seal, MD;  Location: [facility];  Service: Urology;  Laterality: Left;   CYSTOSCOPY WITH URETEROSCOPY  02/28/2012   Procedure: CYSTOSCOPY WITH URETEROSCOPY;  Surgeon: Malka So, MD;  Location: [facility];  Service: Urology;   Laterality: Left;   CYSTOSCOPY/URETEROSCOPY/HOLMIUM LASER/STENT PLACEMENT Right 07/31/2018   Procedure: CYSTOSCOPY STENT REMOVAL  RIGHT URETEROSCOPY WITH HOLMIUM LASER POSSIBLE STENT PLACEMENT;  Surgeon: Irine Seal, MD;  Location: [facility];  Service: Urology;  Laterality: Right;   HOLMIUM LASER APPLICATION Left [id number]   Procedure: HOLMIUM LASER APPLICATION;  Surgeon: Malka So, MD;  Location: [facility];  Service: Urology;  Laterality: Left;   HOLMIUM LASER APPLICATION Right [id number]   Procedure: HOLMIUM LASER APPLICATION;  Surgeon: Irine Seal, MD;  Location: [facility];  Service: Urology;  Laterality: Right;   INGUINAL LYMPH NODE BIOPSY Left 09/16/2018   Procedure: LEFT INGUINAL LYMPH NODE BIOPSY;  Surgeon: Erroll Luna, MD;  Location: [facility];  Service: General;  Laterality: Left;   LITHOTRIPSY      X2 w/o benefit   NEPHROLITHOTOMY  02/28/2012   Procedure: NEPHROLITHOTOMY PERCUTANEOUS;  Surgeon: Malka So, MD;  Location: [facility];  Service: Urology;  Laterality: Left;   TONSILLECTOMY     URETEROSCOPY Left 03/31/2013   Procedure: URETEROSCOPY;  Surgeon: Malka So, MD;  Location: [facility];  Service: Urology;  Laterality: Left;   VASECTOMY     Family History  Problem Relation Age of Onset   Heart attack Father 16        CBAG X5 vessels   Hyperlipidemia Father    Hypertension Father    Heart disease Father        before age 67   Hypertension Brother    Aortic aneurysm Paternal Uncle        AAA ruptured   Heart disease Mother    Hyperlipidemia Mother    Hypertension Mother    Diabetes Neg Hx    Social History   Socioeconomic History   Marital status: Married    Spouse name: Not on file   Number of children: 2   Years of education: 13   Highest education level: Not on file  Occupational History   Occupation: Facilities manager (retired)    Fish farm manager: [employer]  Tobacco Use   Smoking status: Never   Smokeless tobacco: Never  Vaping Use    Vaping Use: Never used  Substance and Sexual Activity   Alcohol use: Yes    Comment:  rarely   Drug use: No   Sexual activity: Not on file  Other Topics Concern   Not on file  Social History Narrative   Fun: Work around the house and work out.    Social Determinants of Health   Financial Resource Strain: Low Risk  (09/24/2022)   Overall Financial Resource Strain (CARDIA)    Difficulty of Paying Living Expenses: Not hard at all  Food Insecurity: No Food Insecurity (09/24/2022)   Hunger Vital Sign    Worried About Running Out of Food in the  Last Year: Never true    [place] in the Last Year: Never true  Transportation Needs: No Transportation Needs (09/24/2022)   PRAPARE - Hydrologist (Medical): No    Lack of Transportation (Non-Medical): No  Physical Activity: Sufficiently Active (09/24/2022)   Exercise Vital Sign    Days of Exercise per Week: 5 days    Minutes of Exercise per Session: 150+ min  Stress: No Stress Concern Present (09/24/2022)   [place]    Feeling of Stress : Not at all  Social Connections: Moderately Isolated (09/24/2022)   Social Connection and Isolation Panel [NHANES]    Frequency of Communication with Friends and Family: Twice a week    Frequency of Social Gatherings with Friends and Family: More than three times a week    Attends Religious Services: Never    Marine scientist or Organizations: No    Attends Music therapist: Never    Marital Status: Married    Tobacco Counseling Counseling given: Not Answered   Clinical Intake:  Pre-visit preparation completed: Yes  Pain : No/denies pain     BMI - recorded: 23.09 Nutritional Status: BMI of 19-24  Normal Nutritional Risks: None Diabetes: No  How often do you need to have someone help you when you read instructions, pamphlets, or other written materials from your doctor or  pharmacy?: 1 - Never  Diabetic?no  Interpreter Needed?: No  Information entered by :: Charlott Rakes, LPN   Activities of Daily Living    09/24/2022   11:42 AM  In your present state of health, do you have any difficulty performing the following activities:  Hearing? 0  Vision? 0  Difficulty concentrating or making decisions? 0  Walking or climbing stairs? 0  Dressing or bathing? 0  Doing errands, shopping? 0  Preparing Food and eating ? N  Using the Toilet? N  In the past six months, have you accidently leaked urine? N  Do you have problems with loss of bowel control? N  Managing your Medications? N  Managing your Finances? N  Housekeeping or managing your Housekeeping? N    Patient Care Team: Vivi Barrack, MD as PCP - General (Family Medicine) Brunetta Genera, MD as Consulting Physician (Hematology) Irine Seal, MD as Attending Physician (Urology) Dyke Maes, OD as Consulting Physician (Optometry) Madelin Rear, [facility] (Inactive) as Pharmacist (Pharmacist)  Indicate any recent Medical Services you may have received from other than [facility] providers in the past year (date may be approximate).     Assessment:   This is a routine wellness examination for Rojelio.  Hearing/Vision screen Hearing Screening - Comments:: Pt stated tinnitus  Vision Screening - Comments:: Pt follows up with eye dr for annual eye exams   Dietary issues and exercise activities discussed: Current Exercise Habits: Home exercise routine, Type of exercise: strength training/weights;Other - see comments, Time (Minutes): > 60, Frequency (Times/Week): 5, Weekly Exercise (Minutes/Week): 0   Goals Addressed             This Visit's Progress    Patient Stated       None at this time        Depression Screen    09/24/2022   11:41 AM 09/21/2022   10:14 AM 09/11/2021   11:46 AM 09/05/2020    1:04 PM 07/05/2020   10:40 AM 04/24/2019   12:57 PM 06/17/2018   10:06 AM  PHQ 2/9 Scores  PHQ -  2 Score 0 0 0 0 0 0 0    Fall Risk    09/24/2022   11:42 AM 09/21/2022   10:14 AM 09/11/2021   11:47 AM 09/05/2020    1:07 PM 07/05/2020   10:40 AM  Fall Risk   Falls in the past year? 0 0 0 0 0  Number falls in past yr: 0 0 0 0   Injury with Fall? 0 0 0 0   Risk for fall due to : Impaired vision No Fall Risks Impaired vision Impaired vision   Follow up Falls prevention discussed  Falls prevention discussed Falls prevention discussed     FALL RISK PREVENTION PERTAINING TO THE HOME:  Any stairs in or around the home? Yes  If so, are there any without handrails? No  Home free of loose throw rugs in walkways, pet beds, electrical cords, etc? Yes  Adequate lighting in your home to reduce risk of falls? Yes   ASSISTIVE DEVICES UTILIZED TO PREVENT FALLS:  Life alert? No  Use of a cane, walker or w/c? No  Grab bars in the bathroom? No  Shower chair or bench in shower? Yes  Elevated toilet seat or a handicapped toilet? No   TIMED UP AND GO:  Was the test performed? No .  Cognitive Function:        09/24/2022   11:43 AM 09/11/2021   11:49 AM 09/05/2020    1:08 PM  6CIT Screen  What Year? 0 points 0 points 0 points  What month? 0 points 0 points 0 points  What time? 0 points 0 points   Count back from 20 0 points 0 points 0 points  Months in reverse 0 points 0 points 0 points  Repeat phrase 0 points 0 points 0 points  Total Score 0 points 0 points     Immunizations Immunization History  Administered Date(s) Administered   COVID-19, mRNA, vaccine(Comirnaty)12 years and older 04/26/2022   Influenza Whole 04/29/2012   Influenza, High Dose Seasonal PF 05/15/2013, 05/24/2016, 06/04/2017, 05/26/2018, 04/24/2019   Influenza,inj,Quad PF,6+ Mos 07/04/2015   Influenza,inj,quad, With Preservative 05/16/2020   Influenza-Unspecified 04/24/2019, 06/21/2021, 04/26/2022   PFIZER(Purple Top)SARS-COV-2 Vaccination 09/05/2019, 09/26/2019, 05/10/2020, 01/12/2021, 08/04/2021   RSV,unspecified  06/11/2022   Tdap 07/04/2015   Zoster Recombinat (Shingrix) 05/16/2020   Zoster, Unspecified 05/29/2021    TDAP status: Up to date  Flu Vaccine status: Up to date  Pneumococcal vaccine status: Due, Education has been provided regarding the importance of this vaccine. Advised may receive this vaccine at local pharmacy or Health Dept. Aware to provide a copy of the vaccination record if obtained from local pharmacy or Health Dept. Verbalized acceptance and understanding.  Covid-19 vaccine status: Completed vaccines  Qualifies for Shingles Vaccine? Yes   Zostavax completed Yes   Shingrix Completed?: Yes  Screening Tests Health Maintenance  Topic Date Due   Pneumonia Vaccine 71+ Years old (1 of 2 - PCV) Never done   COVID-19 Vaccine (7 - 2023-24 season) 06/21/2022   Medicare Annual Wellness (AWV)  09/25/2023   Fecal DNA (Cologuard)  11/17/2023   DTaP/Tdap/Td (2 - Td or Tdap) 07/03/2025   INFLUENZA VACCINE  Completed   Hepatitis C Screening  Completed   Zoster Vaccines- Shingrix  Completed   HPV VACCINES  Aged Out    Health Maintenance  Health Maintenance Due  Topic Date Due   Pneumonia Vaccine 59+ Years old (1 of 2 - PCV) Never done  COVID-19 Vaccine (7 - 2023-24 season) 06/21/2022    Colorectal cancer screening: Type of screening: Cologuard. Completed 11/16/20. Repeat every 3 years  Additional Screening:  Hepatitis C Screening:  Completed 09/29/18  Vision Screening: Recommended annual ophthalmology exams for early detection of glaucoma and other disorders of the eye. Is the patient up to date with their annual eye exam?  Yes  Who is the provider or what is the name of the office in which the patient attends annual eye exams? Unsure of eye Dr name  If pt is not established with a provider, would they like to be referred to a provider to establish care? noi.   Dental Screening: Recommended annual dental exams for proper oral hygiene  Community Resource Referral / Chronic  Care Management: CRR required this visit?  No   CCM required this visit?  No      Plan:     I have personally reviewed and noted the following in the patient's chart:   Medical and social history Use of alcohol, tobacco or illicit drugs  Current medications and supplements including opioid prescriptions. Patient is not currently taking opioid prescriptions. Functional ability and status Nutritional status Physical activity Advanced directives List of other physicians Hospitalizations, surgeries, and ER visits in previous 12 months Vitals Screenings to include cognitive, depression, and falls Referrals and appointments  In addition, I have reviewed and discussed with patient certain preventive protocols, quality metrics, and best practice recommendations. A written personalized care plan for preventive services as well as general preventive health recommendations were provided to patient.     Willette Brace, LPN   [id number]   Nurse Notes: none

## 2022-09-25 NOTE — Progress Notes (Signed)
Please inform patient of the following:  His testosterone levels low but stable compared to previous values.  His A1c is borderline but also stable.  Rest of his labs are all at goal.  Do not need to make any changes to his treatment plan at this time.  He should continue to work on diet and exercise and we can recheck in a year.

## 2022-09-26 ENCOUNTER — Other Ambulatory Visit: Payer: Self-pay | Admitting: Family Medicine

## 2022-12-03 ENCOUNTER — Other Ambulatory Visit: Payer: Self-pay | Admitting: Family Medicine

## 2023-01-05 ENCOUNTER — Other Ambulatory Visit: Payer: Self-pay | Admitting: Family Medicine

## 2023-01-10 ENCOUNTER — Other Ambulatory Visit: Payer: Self-pay | Admitting: Family Medicine

## 2023-01-25 ENCOUNTER — Other Ambulatory Visit: Payer: Self-pay

## 2023-01-25 DIAGNOSIS — C8295 Follicular lymphoma, unspecified, lymph nodes of inguinal region and lower limb: Secondary | ICD-10-CM

## 2023-01-28 ENCOUNTER — Inpatient Hospital Stay: Payer: Medicare Other

## 2023-01-28 ENCOUNTER — Inpatient Hospital Stay: Payer: Medicare Other | Attending: Hematology | Admitting: Hematology

## 2023-01-28 ENCOUNTER — Other Ambulatory Visit: Payer: Self-pay

## 2023-01-28 VITALS — BP 124/80 | HR 53 | Temp 98.2°F | Resp 16 | Ht 73.0 in | Wt 187.1 lb

## 2023-01-28 DIAGNOSIS — C8295 Follicular lymphoma, unspecified, lymph nodes of inguinal region and lower limb: Secondary | ICD-10-CM | POA: Diagnosis not present

## 2023-01-28 DIAGNOSIS — Z8572 Personal history of non-Hodgkin lymphomas: Secondary | ICD-10-CM | POA: Insufficient documentation

## 2023-01-28 LAB — CMP (CANCER CENTER ONLY)
ALT: 27 U/L (ref 0–44)
AST: 33 U/L (ref 15–41)
Albumin: 4 g/dL (ref 3.5–5.0)
Alkaline Phosphatase: 57 U/L (ref 38–126)
Anion gap: 4 — ABNORMAL LOW (ref 5–15)
BUN: 28 mg/dL — ABNORMAL HIGH (ref 8–23)
CO2: 32 mmol/L (ref 22–32)
Calcium: 9.1 mg/dL (ref 8.9–10.3)
Chloride: 105 mmol/L (ref 98–111)
Creatinine: 1.25 mg/dL — ABNORMAL HIGH (ref 0.61–1.24)
GFR, Estimated: >60 mL/min (ref >60)
Glucose, Bld: 101 mg/dL — ABNORMAL HIGH (ref 70–99)
Potassium: 4.7 mmol/L (ref 3.5–5.1)
Sodium: 141 mmol/L (ref 135–145)
Total Bilirubin: 1 mg/dL (ref 0.3–1.2)
Total Protein: 6.4 g/dL — ABNORMAL LOW (ref 6.5–8.1)

## 2023-01-28 LAB — CBC WITH DIFFERENTIAL (CANCER CENTER ONLY)
Abs Immature Granulocytes: 0.01 10*3/uL (ref 0.00–0.07)
Basophils Absolute: 0 10*3/uL (ref 0.0–0.1)
Basophils Relative: 1 %
Eosinophils Absolute: 0.2 10*3/uL (ref 0.0–0.5)
Eosinophils Relative: 3 %
HCT: 45.3 % (ref 39.0–52.0)
Hemoglobin: 15 g/dL (ref 13.0–17.0)
Immature Granulocytes: 0 %
Lymphocytes Relative: 27 %
Lymphs Abs: 1.4 10*3/uL (ref 0.7–4.0)
MCH: 28.4 pg (ref 26.0–34.0)
MCHC: 33.1 g/dL (ref 30.0–36.0)
MCV: 85.6 fL (ref 80.0–100.0)
Monocytes Absolute: 0.5 10*3/uL (ref 0.1–1.0)
Monocytes Relative: 9 %
Neutro Abs: 3.1 10*3/uL (ref 1.7–7.7)
Neutrophils Relative %: 60 %
Platelet Count: 133 10*3/uL — ABNORMAL LOW (ref 150–400)
RBC: 5.29 MIL/uL (ref 4.22–5.81)
RDW: 15.2 % (ref 11.5–15.5)
WBC Count: 5.3 10*3/uL (ref 4.0–10.5)
nRBC: 0 % (ref 0.0–0.2)

## 2023-01-28 LAB — LACTATE DEHYDROGENASE: LDH: 177 U/L (ref 98–192)

## 2023-01-28 NOTE — Progress Notes (Signed)
HEMATOLOGY/ONCOLOGY CLINIC NOTE  Date of Service: 01/28/23    Patient Care Team: Ardith Dark, MD as PCP - General (Family Medicine) Johney Maine, MD as Consulting Physician (Hematology) Bjorn Pippin, MD as Attending Physician (Urology) Elliot Cousin, OD as Consulting Physician (Optometry) Dahlia Byes, [facility] (Inactive) as Pharmacist (Pharmacist)  CHIEF COMPLAINTS/PURPOSE OF CONSULTATION:  Follow-up for continued evaluation and management of follicular lymphoma  HISTORY OF PRESENTING ILLNESS:   Richard [name] is a wonderful 77 y.o. male who has been referred to [place] by Dr. Luisa Hart for evaluation and management of low grade follicular lymphoma. He is being accompanied by his wife, Richard [name] today. He notes that he first noticed a lump to his left inner thigh during a shower approximately 3-4 years ago and noted that it increased in size over the past couple of months.   He had an US of the LLE on 07/16/2018 showed: Soft tissue masses in the left inguinal region most compatible with enlarged lymph nodes/adenopathy, the largest measuring up to 6.1 cm.  On 08/20/2018, the patient underwent cytometry that showed: Tissue-Flow Cytometry with monoclonal B-cell population identified.   Lymph node biopsy on 08/20/2018 showed: Lymph node, needle/core biopsy, Left Inguinal with atypical lymphoid proliferation.   On 09/16/2018, the patient had biopsy completed with pathology showing: Lymph node for lymphoma, left inguinal with low grade follicular lymphoma.   PMHx, he has a hx of squamous cell carcinoma to bilateral sides of his face, none that is active.   SHx, he has had several cystoscopies within the past 4 months and his urologist is   On review of systems, he reports additional left inguinal lump x 4-5 months, intentional weight loss, recurrent kidney stones (most recently December 2019). he denies fever, chills, night sweats, no other areas of concerns, leg swelling, abdominal pain,  flank pain, testicular pain/swelling, rashes, and any other symptoms. He hasn't had his prior stones analyzed to determine the cause of them. He has intentionally lost 45 lbs in the past couple of weeks through keto diet and intermittent fasting. He doesn't consume bread or pasta. He works out with [employer] about every other day. His PCP is Ardith Dark, MD and his Urologist is Dr. Bjorn Pippin. Pertinent positives are listed and detailed within the above HPI.   Interval History:   SEVION SHUTT returns for continued valuation and management of his follicular lymphoma .  Patient was last seen by me on 02/27/2022 and he was doing well overall.   Patient reports he has been doing well overall without any new or severe medical concerns since our last visit. He denies any infection issues, new lumps/bumps, skin rashes, fever, chills, night sweats, abdominal pain, chest pain, back pain, abnormal bowel movements, or leg swelling.  Patient notes he passes 2 kidney stones every year. He also notes of slight hematuria. He denies drinking any carbonated drinks. Patient notes he stay well hydrated.   Patient notes he takes hydrocodone as needed for kidney stone pain.   MEDICAL HISTORY:  Past Medical History:  Diagnosis Date   Aneurysm (HCC)    left common iliac-stable   Arthritis    Fasting hyperglycemia    Follicular lymphoma (HCC) dx'd 07/2018   History of kidney stones    Hyperlipidemia    LDL goal = < 100 based on NMR Lipoproprofile   Hypertension    Mass of left lower extremity    inner thigh   Nephrolithiasis    left  Renal insufficiency    Sepsis (HCC) 03/24/2013   Squamous cell carcinoma, face    Dr Irene Limbo   Ureteral stone with hydronephrosis 03/24/2013   left    SURGICAL HISTORY: Past Surgical History:  Procedure Laterality Date   CATARACT EXTRACTION  2005   OD    COLONOSCOPY W/ POLYPECTOMY  2007   adenoma X2 ; [facility]   CYSTOSCOPY W/ URETERAL STENT PLACEMENT Left  03/23/2013   Procedure: CYSTOSCOPY WITH RETROGRADE PYELOGRAM/URETERAL STENT PLACEMENT;  Surgeon: Antony Haste, MD;  Location: [facility];  Service: Urology;  Laterality: Left;   CYSTOSCOPY WITH RETROGRADE PYELOGRAM, URETEROSCOPY AND STENT PLACEMENT Bilateral 07/15/2018   Procedure: CYSTOSCOPY WITH RETROGRADE PYELOGRAM, URETEROSCOPY;  Surgeon: Bjorn Pippin, MD;  Location: [facility];  Service: Urology;  Laterality: Bilateral;   CYSTOSCOPY WITH STENT PLACEMENT Left 07/15/2018   Procedure: CYSTOSCOPY WITH STENT PLACEMENT;  Surgeon: Bjorn Pippin, MD;  Location: [facility];  Service: Urology;  Laterality: Left;   CYSTOSCOPY WITH URETEROSCOPY  02/28/2012   Procedure: CYSTOSCOPY WITH URETEROSCOPY;  Surgeon: Anner Crete, MD;  Location: [facility];  Service: Urology;  Laterality: Left;   CYSTOSCOPY/URETEROSCOPY/HOLMIUM LASER/STENT PLACEMENT Right 07/31/2018   Procedure: CYSTOSCOPY STENT REMOVAL  RIGHT URETEROSCOPY WITH HOLMIUM LASER POSSIBLE STENT PLACEMENT;  Surgeon: Bjorn Pippin, MD;  Location: [facility];  Service: Urology;  Laterality: Right;   HOLMIUM LASER APPLICATION Left 03/31/2013   Procedure: HOLMIUM LASER APPLICATION;  Surgeon: Anner Crete, MD;  Location: [facility];  Service: Urology;  Laterality: Left;   HOLMIUM LASER APPLICATION Right 07/15/2018   Procedure: HOLMIUM LASER APPLICATION;  Surgeon: Bjorn Pippin, MD;  Location: [facility];  Service: Urology;  Laterality: Right;   INGUINAL LYMPH NODE BIOPSY Left 09/16/2018   Procedure: LEFT INGUINAL LYMPH NODE BIOPSY;  Surgeon: Harriette Bouillon, MD;  Location: [facility];  Service: General;  Laterality: Left;   LITHOTRIPSY      X2 w/o benefit   NEPHROLITHOTOMY  02/28/2012   Procedure: NEPHROLITHOTOMY PERCUTANEOUS;  Surgeon: Anner Crete, MD;  Location: [facility];  Service: Urology;  Laterality: Left;   TONSILLECTOMY     URETEROSCOPY Left 03/31/2013   Procedure: URETEROSCOPY;  Surgeon: Anner Crete, MD;  Location: [facility];  Service: Urology;   Laterality: Left;   VASECTOMY      SOCIAL HISTORY: Social History   Socioeconomic History   Marital status: Married    Spouse name: Not on file   Number of children: 2   Years of education: 13   Highest education level: Not on file  Occupational History   Occupation: Research scientist (medical) (retired)    Associate Professor: [employer]  Tobacco Use   Smoking status: Never   Smokeless tobacco: Never  Vaping Use   Vaping Use: Never used  Substance and Sexual Activity   Alcohol use: Yes    Comment:  rarely   Drug use: No   Sexual activity: Not on file  Other Topics Concern   Not on file  Social History Narrative   Fun: Work around the house and work out.    Social Determinants of Health   Financial Resource Strain: Low Risk  (09/24/2022)   Overall Financial Resource Strain (CARDIA)    Difficulty of Paying Living Expenses: Not hard at all  Food Insecurity: No Food Insecurity (09/24/2022)   Hunger Vital Sign    Worried About Running Out of Food in the Last Year: Never true    Ran Out of Food in the Last  Year: Never true  Transportation Needs: No Transportation Needs (09/24/2022)   PRAPARE - Administrator, Civil Service (Medical): No    Lack of Transportation (Non-Medical): No  Physical Activity: Sufficiently Active (09/24/2022)   Exercise Vital Sign    Days of Exercise per Week: 5 days    Minutes of Exercise per Session: 150+ min  Stress: No Stress Concern Present (09/24/2022)   [employer] of Occupational Health - Occupational Stress Questionnaire    Feeling of Stress : Not at all  Social Connections: Moderately Isolated (09/24/2022)   Social Connection and Isolation Panel [NHANES]    Frequency of Communication with Friends and Family: Twice a week    Frequency of Social Gatherings with Friends and Family: More than three times a week    Attends Religious Services: Never    Database administrator or Organizations: No    Attends Banker Meetings: Never     Marital Status: Married  Catering manager Violence: Not At Risk (09/24/2022)   Humiliation, Afraid, Rape, and Kick questionnaire    Fear of Current or Ex-Partner: No    Emotionally Abused: No    Physically Abused: No    Sexually Abused: No    FAMILY HISTORY: Family History  Problem Relation Age of Onset   Heart attack Father 38        CBAG X5 vessels   Hyperlipidemia Father    Hypertension Father    Heart disease Father        before age 67   Hypertension Brother    Aortic aneurysm Paternal Uncle        AAA ruptured   Heart disease Mother    Hyperlipidemia Mother    Hypertension Mother    Diabetes Neg Hx     ALLERGIES:  is allergic to angiotensin receptor blockers, ciprofloxacin, and pyridium [phenazopyridine].  MEDICATIONS:  Current Outpatient Medications  Medication Sig Dispense Refill   ABRYSVO 120 MCG/0.5ML injection      amLODipine (NORVASC) 5 MG tablet TAKE ONE TABLET BY MOUTH DAILY 90 tablet 3   atorvastatin (LIPITOR) 20 MG tablet TAKE ONE TABLET BY MOUTH ON MONDAY, WEDNESDAY, FRIDAY, AND SUNDAY 51 tablet 3   baclofen (LIORESAL) 10 MG tablet TAKE 1 TABLET BY MOUTH THREE TIMES A DAY AS NEEDED FOR MUSCLE SPASMS 30 tablet 5   COMIRNATY syringe      ferrous sulfate 324 MG TBEC Take 324 mg by mouth.     FLUZONE HIGH-DOSE QUADRIVALENT 0.7 ML SUSY      metoprolol tartrate (LOPRESSOR) 25 MG tablet TAKE 1/2 TABLET BY MOUTH TWICE A DAY 90 tablet 1   Multiple Vitamin (MULTIVITAMIN) tablet Take 1 tablet by mouth daily.     tamsulosin (FLOMAX) 0.4 MG CAPS capsule TAKE ONE CAPSULE BY MOUTH EVERY NIGHT AT BEDTIME 90 capsule 3   No current facility-administered medications for this visit.    REVIEW OF SYSTEMS:   Stools  PHYSICAL EXAMINATION: ECOG FS:1 - Symptomatic but completely ambulatory  .BP 124/80 (BP Location: Left Arm)   Pulse (!) 53   Temp 98.2 F (36.8 C) (Oral)   Resp 16   Ht 6\' 1"  (1.854 m)   Wt 187 lb 1.6 oz (84.9 kg)   SpO2 100%   BMI 24.68 kg/m    Vitals:   01/28/23 1046  BP: 124/80  Pulse: (!) 53  Resp: 16  Temp: 98.2 F (36.8 C)  SpO2: 100%    Wt Readings from Last 3 Encounters:  09/24/22 175 lb (79.4 kg)  09/21/22 181 lb 12.8 oz (82.5 kg)  02/27/22 181 lb 8 oz (82.3 kg)   There is no height or weight on file to calculate BMI.   NAD GENERAL:alert, in no acute distress and comfortable SKIN: no acute rashes, no significant lesions EYES: conjunctiva are pink and non-injected, sclera anicteric OROPHARYNX: MMM, no exudates, no oropharyngeal erythema or ulceration NECK: supple, no JVD LYMPH:  no palpable lymphadenopathy in the cervical, axillary or inguinal regions LUNGS: clear to auscultation b/l with normal respiratory effort HEART: regular rate & rhythm ABDOMEN:  normoactive bowel sounds , non tender, not distended. Extremity: no pedal edema PSYCH: alert & oriented x 3 with fluent speech NEURO: no focal motor/sensory deficits  LABORATORY DATA:  I have reviewed the data as listed  .    Latest Ref Rng & Units 01/28/2023    9:51 AM 09/21/2022   10:55 AM 02/27/2022   11:37 AM  CBC  WBC 4.0 - 10.5 K/uL 5.3  4.5  4.7   Hemoglobin 13.0 - 17.0 g/dL 16.1  09.6  04.5   Hematocrit 39.0 - 52.0 % 45.3  46.6  45.1   Platelets 150 - 400 K/uL 133  143.0  129     .    Latest Ref Rng & Units 01/28/2023    9:51 AM 09/21/2022   10:55 AM 02/27/2022   11:37 AM  CMP  Glucose 70 - 99 mg/dL 409  91  811   BUN 8 - 23 mg/dL 28  33  29   Creatinine 0.61 - 1.24 mg/dL 9.14  7.82  9.56   Sodium 135 - 145 mmol/L 141  141  140   Potassium 3.5 - 5.1 mmol/L 4.7  4.7  4.4   Chloride 98 - 111 mmol/L 105  103  105   CO2 22 - 32 mmol/L 32  30  30   Calcium 8.9 - 10.3 mg/dL 9.1  9.6  9.2   Total Protein 6.5 - 8.1 g/dL 6.4  6.2  6.3   Total Bilirubin 0.3 - 1.2 mg/dL 1.0  1.0  1.0   Alkaline Phos 38 - 126 U/L 57  71  56   AST 15 - 41 U/L 33  26  23   ALT 0 - 44 U/L 27  29  20     . Lab Results  Component Value Date   LDH 143 02/27/2022        08/10/2019 CT Abdomen Pelvis Wo Contrast (Accession [id number])      RADIOGRAPHIC STUDIES: I have personally reviewed the radiological images as listed and agreed with the findings in the report. No results found.  ASSESSMENT & PLAN:   1. Low-grade follicular lymphoma- Stage I to II -First noticed a left inner thigh lump approximately 3-4 years ago and noted that it increased in size over the past couple of months.  -He had an US of the LLE on 07/16/2018 showed: Soft tissue masses in the left inguinal region most compatible with enlarged lymph nodes/adenopathy, the largest measuring up to 6.1 cm. 08/20/2018 Tissue-Flow Cytometry with monoclonal B-cell population identified.  08/20/2018 LN Biopsy revealed Left Inguinal with atypical lymphoid proliferation.  09/16/2018: Lymph node biopsy, left inguinal with low grade follicular lymphoma.   09/29/18 Hep B and Hep C negative 10/08/18 PET/CT revealed Left inguinal and external iliac hypermetabolic adenopathy, consistent with active lymphoma. ([place] 4). 2. No extrapelvic disease identified. 3. Coronary artery atherosclerosis. Aortic Atherosclerosis. 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 08/10/2019 CT Abdomen Pelvis Wo Contrast (Accession [id number]) which revealed "1. Interval response to therapy as evidenced by decrease in size of left external iliac and left inguinal adenopathy. 2. Bilateral renal stones. 3. Aortic atherosclerosis (ICD10-I70.0). 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 -Discussed lab results from today, 01/28/2023, with the patient. CBC shows decreased platelets at 133 K. CMP shows elevated BUN at 28 and elevated creatinine at 1.25. LDH level at 177.  -Plan on CT scan in 6 months prior to our visit.  -Will refill hydrocodone x1  for pain from  ureteric colic from urinary  stones  FOLLOW-UP: CT chest/abd/pelvis in 22 weeks RTC with Dr Candise Che with labs in 24 weeks  The total time spent in the appointment was 21 minutes* .  All of the patient's questions were answered with apparent satisfaction. The patient knows to call the clinic with any problems, questions or concerns.   Wyvonnia Lora MD MS AAHIVMS [facility] Hematology/Oncology Physician [facility]  .*Total Encounter Time as defined by the Centers for Medicare and Medicaid Services includes, in addition to the face-to-face time of a patient visit (documented in the note above) non-face-to-face time: obtaining and reviewing outside history, ordering and reviewing medications, tests or procedures, care coordination (communications with other health care professionals or caregivers) and documentation in the medical record.   Colonel Bald, am acting as a Neurosurgeon for Wyvonnia Lora, MD.

## 2023-01-29 ENCOUNTER — Other Ambulatory Visit: Payer: Self-pay | Admitting: Family Medicine

## 2023-02-01 MED ORDER — HYDROCODONE-ACETAMINOPHEN 5-325 MG PO TABS: 1.0000 | ORAL_TABLET | Freq: Four times a day (QID) | ORAL | 0 refills | Status: AC | PRN

## 2023-02-18 DIAGNOSIS — H5202 Hypermetropia, left eye: Secondary | ICD-10-CM | POA: Diagnosis not present

## 2023-02-18 DIAGNOSIS — H2512 Age-related nuclear cataract, left eye: Secondary | ICD-10-CM | POA: Diagnosis not present

## 2023-02-18 DIAGNOSIS — Z961 Presence of intraocular lens: Secondary | ICD-10-CM | POA: Diagnosis not present

## 2023-02-18 DIAGNOSIS — H25012 Cortical age-related cataract, left eye: Secondary | ICD-10-CM | POA: Diagnosis not present

## 2023-06-21 ENCOUNTER — Ambulatory Visit (HOSPITAL_COMMUNITY)
Admission: RE | Admit: 2023-06-21 | Discharge: 2023-06-21 | Disposition: A | Discharge status: Home / Self Care | Payer: Medicare Other | Source: Ambulatory Visit | Attending: Hematology | Admitting: Hematology

## 2023-06-21 ENCOUNTER — Encounter (HOSPITAL_COMMUNITY): Payer: Self-pay

## 2023-06-21 DIAGNOSIS — C8295 Follicular lymphoma, unspecified, lymph nodes of inguinal region and lower limb: Secondary | ICD-10-CM | POA: Diagnosis not present

## 2023-06-21 DIAGNOSIS — I3139 Other pericardial effusion (noninflammatory): Secondary | ICD-10-CM | POA: Diagnosis not present

## 2023-06-21 DIAGNOSIS — I723 Aneurysm of iliac artery: Secondary | ICD-10-CM | POA: Diagnosis not present

## 2023-06-21 MED ORDER — IOHEXOL 9 MG/ML PO SOLN
ORAL | Status: AC
  Filled 2023-06-21: qty 1000

## 2023-06-21 MED ORDER — IOHEXOL 300 MG/ML  SOLN: 30.0000 mL | Freq: Once | INTRAMUSCULAR | Status: DC | PRN

## 2023-06-21 MED ORDER — IOHEXOL 9 MG/ML PO SOLN
1000.0000 mL | ORAL | Status: AC
  Administered 2023-06-21: 1000 mL via ORAL

## 2023-07-12 ENCOUNTER — Other Ambulatory Visit: Payer: Self-pay

## 2023-07-12 DIAGNOSIS — C8295 Follicular lymphoma, unspecified, lymph nodes of inguinal region and lower limb: Secondary | ICD-10-CM

## 2023-07-15 ENCOUNTER — Inpatient Hospital Stay: Payer: Medicare Other | Attending: Hematology

## 2023-07-15 ENCOUNTER — Inpatient Hospital Stay: Payer: Medicare Other | Admitting: Hematology

## 2023-07-15 VITALS — BP 149/80 | HR 51 | Temp 97.2°F | Resp 20 | Wt 193.5 lb

## 2023-07-15 DIAGNOSIS — C8205 Follicular lymphoma grade I, lymph nodes of inguinal region and lower limb: Secondary | ICD-10-CM | POA: Diagnosis not present

## 2023-07-15 DIAGNOSIS — Z79899 Other long term (current) drug therapy: Secondary | ICD-10-CM | POA: Insufficient documentation

## 2023-07-15 DIAGNOSIS — C8295 Follicular lymphoma, unspecified, lymph nodes of inguinal region and lower limb: Secondary | ICD-10-CM | POA: Diagnosis not present

## 2023-07-15 LAB — CMP (CANCER CENTER ONLY)
ALT: 24 U/L (ref 0–44)
AST: 27 U/L (ref 15–41)
Albumin: 4.5 g/dL (ref 3.5–5.0)
Alkaline Phosphatase: 64 U/L (ref 38–126)
Anion gap: 4 — ABNORMAL LOW (ref 5–15)
BUN: 27 mg/dL — ABNORMAL HIGH (ref 8–23)
CO2: 33 mmol/L — ABNORMAL HIGH (ref 22–32)
Calcium: 10 mg/dL (ref 8.9–10.3)
Chloride: 103 mmol/L (ref 98–111)
Creatinine: 1.36 mg/dL — ABNORMAL HIGH (ref 0.61–1.24)
GFR, Estimated: 54 mL/min — ABNORMAL LOW (ref >60)
Glucose, Bld: 104 mg/dL — ABNORMAL HIGH (ref 70–99)
Potassium: 4.7 mmol/L (ref 3.5–5.1)
Sodium: 140 mmol/L (ref 135–145)
Total Bilirubin: 1 mg/dL (ref <1.2)
Total Protein: 6.6 g/dL (ref 6.5–8.1)

## 2023-07-15 LAB — CBC WITH DIFFERENTIAL (CANCER CENTER ONLY)
Abs Immature Granulocytes: 0.02 10*3/uL (ref 0.00–0.07)
Basophils Absolute: 0 10*3/uL (ref 0.0–0.1)
Basophils Relative: 1 %
Eosinophils Absolute: 0.3 10*3/uL (ref 0.0–0.5)
Eosinophils Relative: 4 %
HCT: 50.1 % (ref 39.0–52.0)
Hemoglobin: 15.9 g/dL (ref 13.0–17.0)
Immature Granulocytes: 0 %
Lymphocytes Relative: 28 %
Lymphs Abs: 1.8 10*3/uL (ref 0.7–4.0)
MCH: 27.3 pg (ref 26.0–34.0)
MCHC: 31.7 g/dL (ref 30.0–36.0)
MCV: 86.1 fL (ref 80.0–100.0)
Monocytes Absolute: 0.6 10*3/uL (ref 0.1–1.0)
Monocytes Relative: 9 %
Neutro Abs: 3.7 10*3/uL (ref 1.7–7.7)
Neutrophils Relative %: 58 %
Platelet Count: 156 10*3/uL (ref 150–400)
RBC: 5.82 MIL/uL — ABNORMAL HIGH (ref 4.22–5.81)
RDW: 14.6 % (ref 11.5–15.5)
WBC Count: 6.4 10*3/uL (ref 4.0–10.5)
nRBC: 0 % (ref 0.0–0.2)

## 2023-07-15 LAB — LACTATE DEHYDROGENASE: LDH: 147 U/L (ref 98–192)

## 2023-07-15 NOTE — Progress Notes (Signed)
HEMATOLOGY/ONCOLOGY CLINIC NOTE  Date of Service: 07/15/23    Patient Care Team: Ardith Dark, MD as PCP - General (Family Medicine) Johney Maine, MD as Consulting Physician (Hematology) Bjorn Pippin, MD as Attending Physician (Urology) Elliot Cousin, OD as Consulting Physician (Optometry) Dahlia Byes, [facility] (Inactive) as Pharmacist (Pharmacist)  CHIEF COMPLAINTS/PURPOSE OF CONSULTATION:  Follow-up for continued evaluation and management of follicular lymphoma  HISTORY OF PRESENTING ILLNESS:   Richard [name] is a wonderful 77 y.o. male who has been referred to [place] by Dr. Luisa Hart for evaluation and management of low grade follicular lymphoma. He is being accompanied by his wife, Richard [name] today. He notes that he first noticed a lump to his left inner thigh during a shower approximately 3-4 years ago and noted that it increased in size over the past couple of months.   He had an US of the LLE on 07/16/2018 showed: Soft tissue masses in the left inguinal region most compatible with enlarged lymph nodes/adenopathy, the largest measuring up to 6.1 cm.  On 08/20/2018, the patient underwent cytometry that showed: Tissue-Flow Cytometry with monoclonal B-cell population identified.   Lymph node biopsy on 08/20/2018 showed: Lymph node, needle/core biopsy, Left Inguinal with atypical lymphoid proliferation.   On 09/16/2018, the patient had biopsy completed with pathology showing: Lymph node for lymphoma, left inguinal with low grade follicular lymphoma.   PMHx, he has a hx of squamous cell carcinoma to bilateral sides of his face, none that is active.   SHx, he has had several cystoscopies within the past 4 months and his urologist is   On review of systems, he reports additional left inguinal lump x 4-5 months, intentional weight loss, recurrent kidney stones (most recently December 2019). he denies fever, chills, night sweats, no other areas of concerns, leg swelling, abdominal pain,  flank pain, testicular pain/swelling, rashes, and any other symptoms. He hasn't had his prior stones analyzed to determine the cause of them. He has intentionally lost 45 lbs in the past couple of weeks through keto diet and intermittent fasting. He doesn't consume bread or pasta. He works out with [employer] about every other day. His PCP is Ardith Dark, MD and his Urologist is Dr. Bjorn Pippin. Pertinent positives are listed and detailed within the above HPI.   Interval History:   Richard [name] returns for continued valuation and management of his follicular lymphoma .  Patient was last seen by me on 01/28/2023 and he complained of mild hematuria, but was doing well overall.   Patient notes he has been doing well overall without any new or severe medical concerns since our last visit. He denies any new infection issues, fever, chills, night sweats, unexpected weight loss chest pain, abdominal pain, or leg swelling. He does complain of mild occasional lower back pain.  He denies any testicular pain or swelling. He does complain of mild hematuria due to kidney stones.   Patient complains of occasional visual issues where he see prism-and-zigzag like objects when he looks at lights. He was having this episode during this visit, which disappeared without any medication. He notes that his episodes lasts for 2-4 minutes. He denies vision loss. Patient has not been to his ophthalmologist regarding this issue.   He has gained weight since our last visit.   Patient notes he has seen a Vascular Surgeon in the past, but does not recall the physician's name.   Patient is complaint with all of his medications.  MEDICAL HISTORY:  Past Medical History:  Diagnosis Date   Aneurysm (HCC)    left common iliac-stable   Arthritis    Fasting hyperglycemia    Follicular lymphoma (HCC) dx'd 07/2018   History of kidney stones    Hyperlipidemia    LDL goal = < 100 based on NMR Lipoproprofile   Hypertension     Mass of left lower extremity    inner thigh   Nephrolithiasis    left    Renal insufficiency    Sepsis (HCC) 03/24/2013   Squamous cell carcinoma, face    Dr Irene Limbo   Ureteral stone with hydronephrosis 03/24/2013   left    SURGICAL HISTORY: Past Surgical History:  Procedure Laterality Date   CATARACT EXTRACTION  2005   OD    COLONOSCOPY W/ POLYPECTOMY  2007   adenoma X2 ; [facility]   CYSTOSCOPY W/ URETERAL STENT PLACEMENT Left 03/23/2013   Procedure: CYSTOSCOPY WITH RETROGRADE PYELOGRAM/URETERAL STENT PLACEMENT;  Surgeon: Antony Haste, MD;  Location: [facility];  Service: Urology;  Laterality: Left;   CYSTOSCOPY WITH RETROGRADE PYELOGRAM, URETEROSCOPY AND STENT PLACEMENT Bilateral 07/15/2018   Procedure: CYSTOSCOPY WITH RETROGRADE PYELOGRAM, URETEROSCOPY;  Surgeon: Bjorn Pippin, MD;  Location: [facility];  Service: Urology;  Laterality: Bilateral;   CYSTOSCOPY WITH STENT PLACEMENT Left 07/15/2018   Procedure: CYSTOSCOPY WITH STENT PLACEMENT;  Surgeon: Bjorn Pippin, MD;  Location: [facility];  Service: Urology;  Laterality: Left;   CYSTOSCOPY WITH URETEROSCOPY  02/28/2012   Procedure: CYSTOSCOPY WITH URETEROSCOPY;  Surgeon: Anner Crete, MD;  Location: [facility];  Service: Urology;  Laterality: Left;   CYSTOSCOPY/URETEROSCOPY/HOLMIUM LASER/STENT PLACEMENT Right 07/31/2018   Procedure: CYSTOSCOPY STENT REMOVAL  RIGHT URETEROSCOPY WITH HOLMIUM LASER POSSIBLE STENT PLACEMENT;  Surgeon: Bjorn Pippin, MD;  Location: [facility];  Service: Urology;  Laterality: Right;   HOLMIUM LASER APPLICATION Left 03/31/2013   Procedure: HOLMIUM LASER APPLICATION;  Surgeon: Anner Crete, MD;  Location: [facility];  Service: Urology;  Laterality: Left;   HOLMIUM LASER APPLICATION Right 07/15/2018   Procedure: HOLMIUM LASER APPLICATION;  Surgeon: Bjorn Pippin, MD;  Location: [facility];  Service: Urology;  Laterality: Right;   INGUINAL LYMPH NODE BIOPSY Left 09/16/2018   Procedure: LEFT INGUINAL LYMPH NODE  BIOPSY;  Surgeon: Harriette Bouillon, MD;  Location: [facility];  Service: General;  Laterality: Left;   LITHOTRIPSY      X2 w/o benefit   NEPHROLITHOTOMY  02/28/2012   Procedure: NEPHROLITHOTOMY PERCUTANEOUS;  Surgeon: Anner Crete, MD;  Location: [facility];  Service: Urology;  Laterality: Left;   TONSILLECTOMY     URETEROSCOPY Left 03/31/2013   Procedure: URETEROSCOPY;  Surgeon: Anner Crete, MD;  Location: [facility];  Service: Urology;  Laterality: Left;   VASECTOMY      SOCIAL HISTORY: Social History   Socioeconomic History   Marital status: Married    Spouse name: Not on file   Number of children: 2   Years of education: 13   Highest education level: Not on file  Occupational History   Occupation: Research scientist (medical) (retired)    Associate Professor: [employer]  Tobacco Use   Smoking status: Never   Smokeless tobacco: Never  Vaping Use   Vaping status: Never Used  Substance and Sexual Activity   Alcohol use: Yes    Comment:  rarely   Drug use: No   Sexual activity: Not on file  Other Topics Concern   Not on file  Social History  Narrative   Fun: Work around [employer] and work out.    Social Drivers of Corporate investment banker Strain: Low Risk  (09/24/2022)   Overall Financial Resource Strain (CARDIA)    Difficulty of Paying Living Expenses: Not hard at all  Food Insecurity: No Food Insecurity (09/24/2022)   Hunger Vital Sign    Worried About Running Out of Food in the Last Year: Never true    Ran Out of Food in the Last Year: Never true  Transportation Needs: No Transportation Needs (09/24/2022)   PRAPARE - Administrator, Civil Service (Medical): No    Lack of Transportation (Non-Medical): No  Physical Activity: Sufficiently Active (09/24/2022)   Exercise Vital Sign    Days of Exercise per Week: 5 days    Minutes of Exercise per Session: 150+ min  Stress: No Stress Concern Present (09/24/2022)   [employer] of Occupational Health - Occupational  Stress Questionnaire    Feeling of Stress : Not at all  Social Connections: Moderately Isolated (09/24/2022)   Social Connection and Isolation Panel [NHANES]    Frequency of Communication with Friends and Family: Twice a week    Frequency of Social Gatherings with Friends and Family: More than three times a week    Attends Religious Services: Never    Database administrator or Organizations: No    Attends Banker Meetings: Never    Marital Status: Married  Catering manager Violence: Not At Risk (09/24/2022)   Humiliation, Afraid, Rape, and Kick questionnaire    Fear of Current or Ex-Partner: No    Emotionally Abused: No    Physically Abused: No    Sexually Abused: No    FAMILY HISTORY: Family History  Problem Relation Age of Onset   Heart attack Father 22        CBAG X5 vessels   Hyperlipidemia Father    Hypertension Father    Heart disease Father        before age 38   Hypertension Brother    Aortic aneurysm Paternal Uncle        AAA ruptured   Heart disease Mother    Hyperlipidemia Mother    Hypertension Mother    Diabetes Neg Hx     ALLERGIES:  is allergic to angiotensin receptor blockers, ciprofloxacin, and pyridium [phenazopyridine].  MEDICATIONS:  Current Outpatient Medications  Medication Sig Dispense Refill   ABRYSVO 120 MCG/0.5ML injection      amLODipine (NORVASC) 5 MG tablet TAKE ONE TABLET BY MOUTH DAILY 90 tablet 3   atorvastatin (LIPITOR) 20 MG tablet TAKE ONE TABLET BY MOUTH ON MONDAY, WEDNESDAY, FRIDAY, AND SUNDAY 51 tablet 3   baclofen (LIORESAL) 10 MG tablet TAKE 1 TABLET BY MOUTH THREE TIMES A DAY AS NEEDED FOR MUSCLE SPASMS 30 tablet 5   COMIRNATY syringe      ferrous sulfate 324 MG TBEC Take 324 mg by mouth.     FLUZONE HIGH-DOSE QUADRIVALENT 0.7 ML SUSY      HYDROcodone-acetaminophen (NORCO) 5-325 MG tablet Take 1-2 tablets by mouth every 6 (six) hours as needed for moderate pain or severe pain (pain from renal stones). 30 tablet 0    metoprolol tartrate (LOPRESSOR) 25 MG tablet TAKE 1/2 TABLET BY MOUTH TWICE A DAY 90 tablet 1   Multiple Vitamin (MULTIVITAMIN) tablet Take 1 tablet by mouth daily.     tamsulosin (FLOMAX) 0.4 MG CAPS capsule TAKE ONE CAPSULE BY MOUTH EVERY NIGHT AT BEDTIME 90  capsule 3   No current facility-administered medications for this visit.    REVIEW OF SYSTEMS:   Stools  PHYSICAL EXAMINATION: ECOG FS:1 - Symptomatic but completely ambulatory  .BP (!) 149/80   Pulse (!) 51   Temp (!) 97.2 F (36.2 C)   Resp 20   Wt 193 lb 8 oz (87.8 kg)   SpO2 100%   BMI 25.53 kg/m   Vitals:   07/15/23 1028  BP: (!) 149/80  Pulse: (!) 51  Resp: 20  Temp: (!) 97.2 F (36.2 C)  SpO2: 100%     Wt Readings from Last 3 Encounters:  01/28/23 187 lb 1.6 oz (84.9 kg)  09/24/22 175 lb (79.4 kg)  09/21/22 181 lb 12.8 oz (82.5 kg)   Body mass index is 25.53 kg/m.   NAD GENERAL:alert, in no acute distress and comfortable SKIN: no acute rashes, no significant lesions EYES: conjunctiva are pink and non-injected, sclera anicteric OROPHARYNX: MMM, no exudates, no oropharyngeal erythema or ulceration NECK: supple, no JVD LYMPH:  no palpable lymphadenopathy in the cervical, axillary or inguinal regions LUNGS: clear to auscultation b/l with normal respiratory effort HEART: regular rate & rhythm ABDOMEN:  normoactive bowel sounds , non tender, not distended. Extremity: no pedal edema PSYCH: alert & oriented x 3 with fluent speech NEURO: no focal motor/sensory deficits  LABORATORY DATA:  I have reviewed the data as listed  .    Latest Ref Rng & Units 07/15/2023    9:56 AM 01/28/2023    9:51 AM 09/21/2022   10:55 AM  CBC  WBC 4.0 - 10.5 K/uL 6.4  5.3  4.5   Hemoglobin 13.0 - 17.0 g/dL 41.3  24.4  01.0   Hematocrit 39.0 - 52.0 % 50.1  45.3  46.6   Platelets 150 - 400 K/uL 156  133  143.0     .    Latest Ref Rng & Units 07/15/2023    9:56 AM 01/28/2023    9:51 AM 09/21/2022   10:55 AM  CMP   Glucose 70 - 99 mg/dL 272  536  91   BUN 8 - 23 mg/dL 27  28  33   Creatinine 0.61 - 1.24 mg/dL 6.44  0.34  7.42   Sodium 135 - 145 mmol/L 140  141  141   Potassium 3.5 - 5.1 mmol/L 4.7  4.7  4.7   Chloride 98 - 111 mmol/L 103  105  103   CO2 22 - 32 mmol/L 33  32  30   Calcium 8.9 - 10.3 mg/dL 59.5  9.1  9.6   Total Protein 6.5 - 8.1 g/dL 6.6  6.4  6.2   Total Bilirubin <1.2 mg/dL 1.0  1.0  1.0   Alkaline Phos 38 - 126 U/L 64  57  71   AST 15 - 41 U/L 27  33  26   ALT 0 - 44 U/L 24  27  29     . Lab Results  Component Value Date   LDH 147 07/15/2023       08/10/2019 CT Abdomen Pelvis Wo Contrast (Accession [id number])      RADIOGRAPHIC STUDIES: I have personally reviewed the radiological images as listed and agreed with the findings in the report. CT CHEST ABDOMEN PELVIS WO CONTRAST Result Date: 06/21/2023 CLINICAL DATA:  Hematologic malignancy, monitor history of follicular lymphoma. * Tracking Code: BO * EXAM: CT CHEST, ABDOMEN AND PELVIS WITHOUT CONTRAST TECHNIQUE: Multidetector CT imaging of the chest, abdomen and pelvis  was performed following the standard protocol without IV contrast. RADIATION DOSE REDUCTION: This exam was performed according to the departmental dose-optimization program which includes automated exposure control, adjustment of the mA and/or kV according to patient size and/or use of iterative reconstruction technique. COMPARISON:  Multiple priors including CT October 18, 2021 CT August 10, 2019 and PET-CT October 08, 2018. FINDINGS: CT CHEST FINDINGS Cardiovascular: Aortic atherosclerosis. Normal size heart. Similar small pericardial effusion. Mediastinum/Nodes: No suspicious thyroid nodule. The esophagus is grossly unremarkable. No pathologically enlarged mediastinal, hilar or axillary lymph nodes. Lungs/Pleura: No suspicious pulmonary nodules or masses. Bibasilar atelectasis/scarring. Musculoskeletal: No aggressive lytic or blastic lesion of bone. Multilevel  degenerative changes spine. Degenerative change of the bilateral shoulders. CT ABDOMEN PELVIS FINDINGS Hepatobiliary: No suspicious hepatic lesion on noncontrast enhanced examination. Gallbladder is unremarkable. No biliary ductal dilation. Pancreas: No pancreatic ductal dilation or evidence of acute inflammation. Spleen: No splenomegaly. Adrenals/Urinary Tract: Bilateral adrenal glands appear normal. Similar left pelviectasis. Bilateral nonobstructive renal stones measure up to 11 mm. Urinary bladder is unremarkable for degree of distension. Stomach/Bowel: Radiopaque enteric contrast material traverses the splenic flexure. Stomach is unremarkable for degree of distension. No pathologic dilation of small or large bowel. Normal appendix. Moderate volume of formed stool in the colon. Vascular/Lymphatic: Aortic atherosclerosis. Aneurysmal dilation of the left common iliac artery measuring 3.6 cm previously 3.3 cm. Aneurysmal dilation of the celiac trunk measures 1.9 cm, unchanged. No pathologically enlarged lymph nodes in the abdomen or pelvis. Decreased size of the left periaortic lymph node now measuring 8 mm in short axis on image 75/2 previously 11 mm. Reproductive: Enlarged prostate gland. Other: No significant abdominopelvic free fluid. Musculoskeletal: No aggressive lytic or blastic lesion of bone. Multilevel degenerative changes spine. Degenerative change of the bilateral hips. IMPRESSION: 1. No pathologically enlarged lymph nodes in the chest, abdomen or pelvis. Decreased size of the left periaortic lymph node now measuring 8 mm in short axis previously 11 mm. 2. No splenomegaly. 3. Aneurysmal dilation of the left common iliac artery measuring 3.6 cm previously 3.3 cm. 4. Aneurysmal dilation of the celiac trunk measures 1.9 cm, unchanged. 5. Bilateral nonobstructive renal stones measure up to 11 mm. 6. Enlarged prostate gland. 7.  Aortic Atherosclerosis (ICD10-I70.0). Electronically Signed   By: Maudry Mayhew  M.D.   On: 06/21/2023 16:32    ASSESSMENT & PLAN:   1. Low-grade follicular lymphoma- Stage I to II -First noticed a left inner thigh lump approximately 3-4 years ago and noted that it increased in size over the past couple of months.  -He had an US of the LLE on 07/16/2018 showed: Soft tissue masses in the left inguinal region most compatible with enlarged lymph nodes/adenopathy, the largest measuring up to 6.1 cm. 08/20/2018 Tissue-Flow Cytometry with monoclonal B-cell population identified.  08/20/2018 LN Biopsy revealed Left Inguinal with atypical lymphoid proliferation.  09/16/2018: Lymph node biopsy, left inguinal with low grade follicular lymphoma.   09/29/18 Hep B and Hep C negative 10/08/18 PET/CT revealed Left inguinal and external iliac hypermetabolic adenopathy, consistent with active lymphoma. ([place] 4). 2. No extrapelvic disease identified. 3. Coronary artery atherosclerosis. Aortic Atherosclerosis. 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 08/10/2019 CT Abdomen Pelvis Wo Contrast (Accession [id number]) which revealed "1. Interval response to therapy as evidenced by decrease in size of left external iliac and left inguinal adenopathy. 2. Bilateral renal stones. 3. Aortic atherosclerosis (ICD10-I70.0). 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 CBC is stable. CMP shows elevated BUN of 27 and elevated creatinine of 1.36. -Discussed CT Chest/Abdomen,Pelvis result from 06/21/2023 with the patient. Showed No pathologically enlarged lymph nodes in the chest, abdomen or pelvis. Decreased size of the left periaortic lymph node now measuring 8 mm in short axis previously 11 mm. Aneurysmal dilation of the left common iliac artery measuring 3.6 cm previously 3.3 cm. Aneurysmal dilation of the celiac trunk measures 1.9 cm, unchanged. Bilateral nonobstructive renal  stones measure up to 11 mm. Enlarged prostate gland. -Recommend to follow-up with PCP for a new referral to a Vascular Surgeon regarding Aneurysmal dilation of the left common iliac artery. -Patient has no clinical evidence suggestive of lymphoma progression at this time. -Answered all of patient's questions.  -Discussed the next option of visit with labs here every year or continue to follow-up with PCP with labs.  -We will see the patient in one year anf i  FOLLOW-UP: RTC with Dr Candise Che with labs in 12 months  The total time spent in the appointment was 25 minutes* .  All of the patient's questions were answered with apparent satisfaction. The patient knows to call the clinic with any problems, questions or concerns.   Wyvonnia Lora MD MS AAHIVMS [facility] Hematology/Oncology Physician [facility]  .*Total Encounter Time as defined by the Centers for Medicare and Medicaid Services includes, in addition to the face-to-face time of a patient visit (documented in the note above) non-face-to-face time: obtaining and reviewing outside history, ordering and reviewing medications, tests or procedures, care coordination (communications with other health care professionals or caregivers) and documentation in the medical record.   I,Param Shah,acting as a Neurosurgeon for Wyvonnia Lora, MD.,have documented all relevant documentation on the behalf of Wyvonnia Lora, MD,as directed by  Wyvonnia Lora, MD while in the presence of Wyvonnia Lora, MD.   .I have reviewed the above documentation for accuracy and completeness, and I agree with the above. Johney Maine MD

## 2023-07-27 ENCOUNTER — Other Ambulatory Visit: Payer: Self-pay | Admitting: Family Medicine

## 2023-09-24 ENCOUNTER — Ambulatory Visit (INDEPENDENT_AMBULATORY_CARE_PROVIDER_SITE_OTHER): Payer: Medicare Other | Admitting: Family Medicine

## 2023-09-24 ENCOUNTER — Encounter: Payer: Self-pay | Admitting: Family Medicine

## 2023-09-24 VITALS — BP 136/88 | HR 93 | Temp 97.3°F | Ht 73.0 in | Wt 188.4 lb

## 2023-09-24 DIAGNOSIS — E782 Mixed hyperlipidemia: Secondary | ICD-10-CM | POA: Diagnosis not present

## 2023-09-24 DIAGNOSIS — R7301 Impaired fasting glucose: Secondary | ICD-10-CM

## 2023-09-24 DIAGNOSIS — N1831 Chronic kidney disease, stage 3a: Secondary | ICD-10-CM | POA: Diagnosis not present

## 2023-09-24 DIAGNOSIS — I10 Essential (primary) hypertension: Secondary | ICD-10-CM | POA: Diagnosis not present

## 2023-09-24 DIAGNOSIS — Z0001 Encounter for general adult medical examination with abnormal findings: Secondary | ICD-10-CM

## 2023-09-24 DIAGNOSIS — Z1211 Encounter for screening for malignant neoplasm of colon: Secondary | ICD-10-CM

## 2023-09-24 DIAGNOSIS — M545 Low back pain, unspecified: Secondary | ICD-10-CM

## 2023-09-24 DIAGNOSIS — I723 Aneurysm of iliac artery: Secondary | ICD-10-CM

## 2023-09-24 DIAGNOSIS — G2581 Restless legs syndrome: Secondary | ICD-10-CM | POA: Diagnosis not present

## 2023-09-24 DIAGNOSIS — C8206 Follicular lymphoma grade I, intrapelvic lymph nodes: Secondary | ICD-10-CM

## 2023-09-24 DIAGNOSIS — Z87442 Personal history of urinary calculi: Secondary | ICD-10-CM

## 2023-09-24 LAB — COMPREHENSIVE METABOLIC PANEL
ALT: 20 U/L (ref 0–53)
AST: 24 U/L (ref 0–37)
Albumin: 4.3 g/dL (ref 3.5–5.2)
Alkaline Phosphatase: 62 U/L (ref 39–117)
BUN: 25 mg/dL — ABNORMAL HIGH (ref 6–23)
CO2: 30 meq/L (ref 19–32)
Calcium: 9.4 mg/dL (ref 8.4–10.5)
Chloride: 103 meq/L (ref 96–112)
Creatinine, Ser: 1.3 mg/dL (ref 0.40–1.50)
GFR: 53.63 mL/min — ABNORMAL LOW (ref >60.00)
Glucose, Bld: 97 mg/dL (ref 70–99)
Potassium: 4.4 meq/L (ref 3.5–5.1)
Sodium: 140 meq/L (ref 135–145)
Total Bilirubin: 1.1 mg/dL (ref 0.2–1.2)
Total Protein: 6.4 g/dL (ref 6.0–8.3)

## 2023-09-24 LAB — CBC
HCT: 48 % (ref 39.0–52.0)
Hemoglobin: 15.6 g/dL (ref 13.0–17.0)
MCHC: 32.5 g/dL (ref 30.0–36.0)
MCV: 85.7 fL (ref 78.0–100.0)
Platelets: 154 10*3/uL (ref 150.0–400.0)
RBC: 5.59 Mil/uL (ref 4.22–5.81)
RDW: 14.9 % (ref 11.5–15.5)
WBC: 4.9 10*3/uL (ref 4.0–10.5)

## 2023-09-24 LAB — LIPID PANEL
Cholesterol: 151 mg/dL (ref 0–200)
HDL: 76.4 mg/dL (ref >39.00)
LDL Cholesterol: 65 mg/dL (ref 0–99)
NonHDL: 75.09
Total CHOL/HDL Ratio: 2
Triglycerides: 50 mg/dL (ref 0.0–149.0)
VLDL: 10 mg/dL (ref 0.0–40.0)

## 2023-09-24 LAB — VITAMIN B12: Vitamin B-12: 518 pg/mL (ref 211–911)

## 2023-09-24 LAB — HEMOGLOBIN A1C: Hgb A1c MFr Bld: 5.7 % (ref 4.6–6.5)

## 2023-09-24 LAB — TSH: TSH: 3.14 u[IU]/mL (ref 0.35–5.50)

## 2023-09-24 LAB — MAGNESIUM: Magnesium: 2.2 mg/dL (ref 1.5–2.5)

## 2023-09-24 MED ORDER — AMLODIPINE BESYLATE 5 MG PO TABS: 5.0000 mg | ORAL_TABLET | Freq: Every day | ORAL | 3 refills | Status: AC

## 2023-09-24 MED ORDER — TAMSULOSIN HCL 0.4 MG PO CAPS: ORAL_CAPSULE | ORAL | 3 refills | Status: AC

## 2023-09-24 MED ORDER — METOPROLOL TARTRATE 25 MG PO TABS: ORAL_TABLET | ORAL | 3 refills | Status: AC

## 2023-09-24 MED ORDER — ATORVASTATIN CALCIUM 20 MG PO TABS: ORAL_TABLET | ORAL | 3 refills | Status: AC

## 2023-09-24 MED ORDER — BACLOFEN 10 MG PO TABS: ORAL_TABLET | ORAL | 5 refills | Status: AC

## 2023-09-24 NOTE — Assessment & Plan Note (Signed)
 Stable on baclofen as needed.  Will refill today.

## 2023-09-24 NOTE — Assessment & Plan Note (Signed)
 Blood pressure at goal on amlodipine 5 mg daily and Metropol tartrate 12.5 mg twice daily.

## 2023-09-24 NOTE — Assessment & Plan Note (Addendum)
 Has had persistent hematuria for the last several months.  His CT scan from a few months ago via oncology did show 11 mm stones.  He last saw urology several months ago at [facility] but would like to be referred to a different office.

## 2023-09-24 NOTE — Assessment & Plan Note (Signed)
 Check labs

## 2023-09-24 NOTE — Progress Notes (Signed)
 Chief Complaint:  SHUAYB SCHEPERS is a 78 y.o. male who presents today for his annual comprehensive physical exam.    Assessment/Plan:  Chronic Problems Addressed Today: HYPERLIPIDEMIA Check lipids.  On Lipitor 20 mg 4 times weekly.  Also check cardiac CT scan today per patient request.  Essential hypertension Blood pressure at goal on amlodipine 5 mg daily and Metropol tartrate 12.5 mg twice daily.  Fasting hyperglycemia Check A1c.  NEPHROLITHIASIS, HX OF Has had persistent hematuria for the last several months.  His CT scan from a few months ago via oncology did show 11 mm stones.  He last saw [facility]rology several months ago at [facility] but would like to be referred to a different office.    Follicular lymphoma grade I of intrapelvic lymph nodes (HCC) Continue management per oncology.  CKD (chronic kidney disease) stage 3, GFR 30-59 ml/min Check labs.  Restless legs syndrome Symptoms are over all stable and manageable.  Will check labs today.  Common iliac aneurysm (HCC) Incidentally found on recent CT scan with left common iliac aneurysm 3.6 cm which had increased in size compared to previous.  I did recommend referral to vascular surgery however he declined.  He will let us know if he changes his mind.  Intermittent low back pain Stable on baclofen as needed.  Will refill today.  Preventative Healthcare: Check labs.  Pneumonia vaccine declined.  Due for cologuard later this year-will order this today.  Up-to-date on flu and COVID-vaccine.  Patient Counseling(The following topics were reviewed and/or handout was given):  -Nutrition: Stressed importance of moderation in sodium/caffeine intake, saturated fat and cholesterol, caloric balance, sufficient intake of fresh fruits, vegetables, and fiber.  -Stressed the importance of regular exercise.   -Substance Abuse: Discussed cessation/primary prevention of tobacco, alcohol, or other drug use; driving or other dangerous activities  under the influence; availability of treatment for abuse.   -Injury prevention: Discussed safety belts, safety helmets, smoke detector, smoking near bedding or upholstery.   -Sexuality: Discussed sexually transmitted diseases, partner selection, use of condoms, avoidance of unintended pregnancy and contraceptive alternatives.   -Dental health: Discussed importance of regular tooth brushing, flossing, and dental visits.  -Health maintenance and immunizations reviewed. Please refer to Health maintenance section.  Return to care in 1 year for next preventative visit.     Subjective:  HPI:  He has no acute complaints today. See Assessment / plan for status of chronic conditions.   He has had some blood in his urine for the last several months. He has been following with urology but has not seen them for a while. He has been told that he has had kidney stones in the past and did pass one a few months ago. He is having some occasionally left flank pain but this is manageable.   Lifestyle Diet: Balanced. Plenty of fruits and vegetables.  Exercise:Works on treadmill almost daily     09/24/2023    9:45 AM  Depression screen PHQ 2/9  Decreased Interest 0  Down, Depressed, Hopeless 0  PHQ - 2 Score 0    Health Maintenance Due  Topic Date Due   Pneumonia Vaccine 86+ Years old (1 of 2 - PCV) Never done   Medicare Annual Wellness (AWV)  09/25/2023     ROS: Per HPI, otherwise a complete review of systems was negative.   PMH:  The following were reviewed and entered/updated in epic: Past Medical History:  Diagnosis Date   Aneurysm (HCC)    left  common iliac-stable   Arthritis    Fasting hyperglycemia    Follicular lymphoma (HCC) dx'd 07/2018   History of kidney stones    Hyperlipidemia    LDL goal = < 100 based on NMR Lipoproprofile   Hypertension    Mass of left lower extremity    inner thigh   Nephrolithiasis    left    Renal insufficiency    Sepsis (HCC) 03/24/2013   Squamous  cell carcinoma, face    Dr Irene Limbo   Ureteral stone with hydronephrosis 03/24/2013   left   Patient Active Problem List   Diagnosis Date Noted   Low testosterone 07/05/2020   CKD (chronic kidney disease) stage 3, GFR 30-59 ml/min (HCC) 05/11/2019   Restless legs syndrome 05/11/2019   Intermittent low back pain 05/11/2019   Follicular lymphoma grade I of intrapelvic lymph nodes (HCC) 12/16/2018   Erectile dysfunction 06/26/2017   Common iliac aneurysm (HCC) 08/01/2011   ADENOMATOUS COLONIC POLYP 03/08/2009   Fasting hyperglycemia 03/08/2009   HYPERLIPIDEMIA 09/02/2007   Essential hypertension 09/02/2007   NEPHROLITHIASIS, HX OF 09/02/2007   Past Surgical History:  Procedure Laterality Date   CATARACT EXTRACTION  2005   OD    COLONOSCOPY W/ POLYPECTOMY  2007   adenoma X2 ; [facility]   CYSTOSCOPY W/ URETERAL STENT PLACEMENT Left 03/23/2013   Procedure: CYSTOSCOPY WITH RETROGRADE PYELOGRAM/URETERAL STENT PLACEMENT;  Surgeon: Antony Haste, MD;  Location: [facility];  Service: Urology;  Laterality: Left;   CYSTOSCOPY WITH RETROGRADE PYELOGRAM, URETEROSCOPY AND STENT PLACEMENT Bilateral 07/15/2018   Procedure: CYSTOSCOPY WITH RETROGRADE PYELOGRAM, URETEROSCOPY;  Surgeon: Bjorn Pippin, MD;  Location: [facility];  Service: Urology;  Laterality: Bilateral;   CYSTOSCOPY WITH STENT PLACEMENT Left 07/15/2018   Procedure: CYSTOSCOPY WITH STENT PLACEMENT;  Surgeon: Bjorn Pippin, MD;  Location: [facility];  Service: Urology;  Laterality: Left;   CYSTOSCOPY WITH URETEROSCOPY  02/28/2012   Procedure: CYSTOSCOPY WITH URETEROSCOPY;  Surgeon: Anner Crete, MD;  Location: [facility];  Service: Urology;  Laterality: Left;   CYSTOSCOPY/URETEROSCOPY/HOLMIUM LASER/STENT PLACEMENT Right 07/31/2018   Procedure: CYSTOSCOPY STENT REMOVAL  RIGHT URETEROSCOPY WITH HOLMIUM LASER POSSIBLE STENT PLACEMENT;  Surgeon: Bjorn Pippin, MD;  Location: [facility];  Service: Urology;  Laterality: Right;   HOLMIUM LASER  APPLICATION Left 03/31/2013   Procedure: HOLMIUM LASER APPLICATION;  Surgeon: Anner Crete, MD;  Location: [facility];  Service: Urology;  Laterality: Left;   HOLMIUM LASER APPLICATION Right 07/15/2018   Procedure: HOLMIUM LASER APPLICATION;  Surgeon: Bjorn Pippin, MD;  Location: [facility];  Service: Urology;  Laterality: Right;   INGUINAL LYMPH NODE BIOPSY Left 09/16/2018   Procedure: LEFT INGUINAL LYMPH NODE BIOPSY;  Surgeon: Harriette Bouillon, MD;  Location: [facility];  Service: General;  Laterality: Left;   LITHOTRIPSY      X2 w/o benefit   NEPHROLITHOTOMY  02/28/2012   Procedure: NEPHROLITHOTOMY PERCUTANEOUS;  Surgeon: Anner Crete, MD;  Location: [facility];  Service: Urology;  Laterality: Left;   TONSILLECTOMY     URETEROSCOPY Left 03/31/2013   Procedure: URETEROSCOPY;  Surgeon: Anner Crete, MD;  Location: [facility];  Service: Urology;  Laterality: Left;   VASECTOMY      Family History  Problem Relation Age of Onset   Heart attack Father 32        CBAG X5 vessels   Hyperlipidemia Father    Hypertension Father    Heart disease Father  before age 50   Hypertension Brother    Aortic aneurysm Paternal Uncle        AAA ruptured   Heart disease Mother    Hyperlipidemia Mother    Hypertension Mother    Diabetes Neg Hx     Medications- reviewed and updated Current Outpatient Medications  Medication Sig Dispense Refill   HYDROcodone-acetaminophen (NORCO) 5-325 MG tablet Take 1-2 tablets by mouth every 6 (six) hours as needed for moderate pain or severe pain (pain from renal stones). 30 tablet 0   Multiple Vitamin (MULTIVITAMIN) tablet Take 1 tablet by mouth daily.     ABRYSVO 120 MCG/0.5ML injection  (Patient not taking: Reported on 09/24/2023)     amLODipine (NORVASC) 5 MG tablet Take 1 tablet (5 mg total) by mouth daily. 90 tablet 3   atorvastatin (LIPITOR) 20 MG tablet TAKE ONE TABLET BY MOUTH ON MONDAY, WEDNESDAY, FRIDAY, AND SUNDAY 51 tablet 3   baclofen (LIORESAL) 10 MG  tablet TAKE 1 TABLET BY MOUTH THREE TIMES A DAY AS NEEDED FOR MUSCLE SPASMS 30 tablet 5   COMIRNATY syringe      ferrous sulfate 324 MG TBEC Take 324 mg by mouth.     FLUZONE HIGH-DOSE QUADRIVALENT 0.7 ML SUSY      metoprolol tartrate (LOPRESSOR) 25 MG tablet TAKE ONE-HALF (0.5) TABLET BY MOUTH TWO TIMES A DAY 90 tablet 3   tamsulosin (FLOMAX) 0.4 MG CAPS capsule TAKE ONE CAPSULE BY MOUTH EVERY NIGHT AT BEDTIME 90 capsule 3   No current facility-administered medications for this visit.    Allergies-reviewed and updated Allergies  Allergen Reactions   Angiotensin Receptor Blockers     Angioedema with ACE-I   Ciprofloxacin     Rash on combination of Cipro & Pyridium after cystoscopic & open resection of renal calculi   Pyridium [Phenazopyridine]     In combo with Cipro    Social History   Socioeconomic History   Marital status: Married    Spouse name: Not on file   Number of children: 2   Years of education: 13   Highest education level: Not on file  Occupational History   Occupation: Research scientist (medical) (retired)    Associate Professor: [employer]  Tobacco Use   Smoking status: Never   Smokeless tobacco: Never  Vaping Use   Vaping status: Never Used  Substance and Sexual Activity   Alcohol use: Yes    Comment:  rarely   Drug use: No   Sexual activity: Not on file  Other Topics Concern   Not on file  Social History Narrative   Fun: Work around the house and work out.    Social Drivers of Corporate investment banker Strain: Low Risk  (09/24/2022)   Overall Financial Resource Strain (CARDIA)    Difficulty of Paying Living Expenses: Not hard at all  Food Insecurity: No Food Insecurity (09/24/2022)   Hunger Vital Sign    Worried About Running Out of Food in the Last Year: Never true    Ran Out of Food in the Last Year: Never true  Transportation Needs: No Transportation Needs (09/24/2022)   PRAPARE - Administrator, Civil Service (Medical): No    Lack of  Transportation (Non-Medical): No  Physical Activity: Sufficiently Active (09/24/2022)   Exercise Vital Sign    Days of Exercise per Week: 5 days    Minutes of Exercise per Session: 150+ min  Stress: No Stress Concern Present (09/24/2022)   [employer] of  Occupational Health - Occupational Stress Questionnaire    Feeling of Stress : Not at all  Social Connections: Moderately Isolated (09/24/2022)   Social Connection and Isolation Panel [NHANES]    Frequency of Communication with Friends and Family: Twice a week    Frequency of Social Gatherings with Friends and Family: More than three times a week    Attends Religious Services: Never    Database administrator or Organizations: No    Attends Engineer, structural: Never    Marital Status: Married        Objective:  Physical Exam: BP 136/88   Pulse 93   Temp (!) 97.3 F (36.3 C) (Temporal)   Ht 6\' 1"  (1.854 m)   Wt 188 lb 6.4 oz (85.5 kg)   SpO2 99%   BMI 24.86 kg/m   Body mass index is 24.86 kg/m. Wt Readings from Last 3 Encounters:  09/24/23 188 lb 6.4 oz (85.5 kg)  07/15/23 193 lb 8 oz (87.8 kg)  01/28/23 187 lb 1.6 oz (84.9 kg)   Gen: NAD, resting comfortably HEENT: TMs normal bilaterally. OP clear. No thyromegaly noted.  CV: RRR with no murmurs appreciated Pulm: NWOB, CTAB with no crackles, wheezes, or rhonchi GI: Normal bowel sounds present. Soft, Nontender, Nondistended. MSK: no edema, cyanosis, or clubbing noted Skin: warm, dry Neuro: CN2-12 grossly intact. Strength 5/5 in upper and lower extremities. Reflexes symmetric and intact bilaterally.  Psych: Normal affect and thought content     Virdie Penning M. Jimmey Ralph, MD 09/24/2023 10:25 AM

## 2023-09-24 NOTE — Patient Instructions (Addendum)
 It was very nice to see you today!  I will refer you to see the urologist.   I will set up your for a cardiac CT scan.   We will check blood work.   Let me know if you change your mind about seeing a vascular surgeon.   You are due for your cologuard after April 20.  We will place this order today.  Return in about 1 year (around 09/23/2024) for Annual Physical.   Take care, Dr Jimmey Ralph  PLEASE NOTE:  If you had any lab tests, please let us know if you have not heard back within a few days. You may see your results on mychart before we have a chance to review them but we will give you a call once they are reviewed by [place].   If we ordered any referrals today, please let us know if you have not heard from their office within the next week.   If you had any urgent prescriptions sent in today, please check with the pharmacy within an hour of our visit to make sure the prescription was transmitted appropriately.   Please try these tips to maintain a healthy lifestyle:  Eat at least 3 REAL meals and 1-2 snacks per day.  Aim for no more than 5 hours between eating.  If you eat breakfast, please do so within one hour of getting up.   Each meal should contain half fruits/vegetables, one quarter protein, and one quarter carbs (no bigger than a computer mouse)  Cut down on sweet beverages. This includes juice, soda, and sweet tea.   Drink at least 1 glass of water with each meal and aim for at least 8 glasses per day  Exercise at least 150 minutes every week.    Preventive Care 60 Years and Older, Male Preventive care refers to lifestyle choices and visits with your health care provider that can promote health and wellness. Preventive care visits are also called wellness exams. What can I expect for my preventive care visit? Counseling During your preventive care visit, your health care provider may ask about your: Medical history, including: Past medical problems. Family medical  history. History of falls. Current health, including: Emotional well-being. Home life and relationship well-being. Sexual activity. Memory and ability to understand (cognition). Lifestyle, including: Alcohol, nicotine or tobacco, and drug use. Access to firearms. Diet, exercise, and sleep habits. Work and work Astronomer. Sunscreen use. Safety issues such as seatbelt and bike helmet use. Physical exam Your health care provider will check your: Height and weight. These may be used to calculate your BMI (body mass index). BMI is a measurement that tells if you are at a healthy weight. Waist circumference. This measures the distance around your waistline. This measurement also tells if you are at a healthy weight and may help predict your risk of certain diseases, such as type 2 diabetes and high blood pressure. Heart rate and blood pressure. Body temperature. Skin for abnormal spots. What immunizations do I need?  Vaccines are usually given at various ages, according to a schedule. Your health care provider will recommend vaccines for you based on your age, medical history, and lifestyle or other factors, such as travel or where you work. What tests do I need? Screening Your health care provider may recommend screening tests for certain conditions. This may include: Lipid and cholesterol levels. Diabetes screening. This is done by checking your blood sugar (glucose) after you have not eaten for a while (fasting). Hepatitis C  test. Hepatitis B test. HIV (human immunodeficiency virus) test. STI (sexually transmitted infection) testing, if you are at risk. Lung cancer screening. Colorectal cancer screening. Prostate cancer screening. Abdominal aortic aneurysm (AAA) screening. You may need this if you are a current or former smoker. Talk with your health care provider about your test results, treatment options, and if necessary, the need for more tests. Follow these instructions at  home: Eating and drinking  Eat a diet that includes fresh fruits and vegetables, whole grains, lean protein, and low-fat dairy products. Limit your intake of foods with high amounts of sugar, saturated fats, and salt. Take vitamin and mineral supplements as recommended by your health care provider. Do not drink alcohol if your health care provider tells you not to drink. If you drink alcohol: Limit how much you have to 0-2 drinks a day. Know how much alcohol is in your drink. In the [place] one drink equals one 12 oz bottle of beer (355 mL), one 5 oz glass of wine (148 mL), or one 1 oz glass of hard liquor (44 mL). Lifestyle Brush your teeth every morning and night with fluoride toothpaste. Floss one time each day. Exercise for at least 30 minutes 5 or more days each week. Do not use any products that contain nicotine or tobacco. These products include cigarettes, chewing tobacco, and vaping devices, such as e-cigarettes. If you need help quitting, ask your health care provider. Do not use drugs. If you are sexually active, practice safe sex. Use a condom or other form of protection to prevent STIs. Take aspirin only as told by your health care provider. Make sure that you understand how much to take and what form to take. Work with your health care provider to find out whether it is safe and beneficial for you to take aspirin daily. Ask your health care provider if you need to take a cholesterol-lowering medicine (statin). Find healthy ways to manage stress, such as: Meditation, yoga, or listening to music. Journaling. Talking to a trusted person. Spending time with friends and family. Safety Always wear your seat belt while driving or riding in a vehicle. Do not drive: If you have been drinking alcohol. Do not ride with someone who has been drinking. When you are tired or distracted. While texting. If you have been using any mind-altering substances or drugs. Wear a helmet and other  protective equipment during sports activities. If you have firearms in your house, make sure you follow all gun safety procedures. Minimize exposure to UV radiation to reduce your risk of skin cancer. What's next? Visit your health care provider once a year for an annual wellness visit. Ask your health care provider how often you should have your eyes and teeth checked. Stay up to date on all vaccines. This information is not intended to replace advice given to you by your health care provider. Make sure you discuss any questions you have with your health care provider. Document Revised: 01/11/2021 Document Reviewed: 01/11/2021 Elsevier Patient Education  2024 [employer].

## 2023-09-24 NOTE — Assessment & Plan Note (Signed)
 Check A1c.

## 2023-09-24 NOTE — Assessment & Plan Note (Signed)
Continue management per oncology. 

## 2023-09-24 NOTE — Assessment & Plan Note (Signed)
 Symptoms are over all stable and manageable.  Will check labs today.

## 2023-09-24 NOTE — Assessment & Plan Note (Addendum)
 Incidentally found on recent CT scan with left common iliac aneurysm 3.6 cm which had increased in size compared to previous.  I did recommend referral to vascular surgery however he declined.  He will let us know if he changes his mind.

## 2023-09-24 NOTE — Assessment & Plan Note (Signed)
 Check lipids.  On Lipitor 20 mg 4 times weekly.  Also check cardiac CT scan today per patient request.

## 2023-09-25 LAB — IRON,TIBC AND FERRITIN PANEL
%SAT: 43 % (ref 20–48)
Ferritin: 113 ng/mL (ref 24–380)
Iron: 109 ug/dL (ref 50–180)
TIBC: 251 ug/dL (ref 250–425)

## 2023-09-26 ENCOUNTER — Encounter: Payer: Self-pay | Admitting: Family Medicine

## 2023-09-26 NOTE — Progress Notes (Signed)
 Labs are all stable.  Do not need to make any symptoms to treatment plan at this time.  He should continue to work on diet and exercise and we can recheck everything in a year or so.

## 2023-09-30 ENCOUNTER — Ambulatory Visit (INDEPENDENT_AMBULATORY_CARE_PROVIDER_SITE_OTHER): Payer: Medicare Other

## 2023-09-30 VITALS — Ht 73.0 in | Wt 188.0 lb

## 2023-09-30 DIAGNOSIS — Z0000 Encounter for general adult medical examination without abnormal findings: Secondary | ICD-10-CM

## 2023-09-30 NOTE — Progress Notes (Addendum)
 Subjective:   Richard [name] is a 78 y.o. who presents for a Medicare Wellness preventive visit.  Visit Complete: Virtual I connected with  Richard [name] on 09/30/23 by a audio enabled telemedicine application and verified that I am speaking with the correct person using two identifiers.  Patient Location: Home  Provider Location: Office/Clinic  I discussed the limitations of evaluation and management by telemedicine. The patient expressed understanding and agreed to proceed.  Vital Signs: Because this visit was a virtual/telehealth visit, some criteria may be missing or patient reported. Any vitals not documented were not able to be obtained and vitals that have been documented are patient reported.  VideoDeclined- This patient declined Librarian, academic. Therefore the visit was completed with audio only.  AWV Questionnaire: Yes: Patient Medicare AWV questionnaire was completed by the patient on 09/29/23; I have confirmed that all information answered by patient is correct and no changes since this date.  Cardiac Risk Factors include: advanced age (>95men, >54 women);dyslipidemia;male gender;hypertension     Objective:    Today's Vitals   09/30/23 1422  Weight: 188 lb (85.3 kg)  Height: 6\' 1"  (1.854 m)   Body mass index is 24.8 kg/m.     09/30/2023    2:25 PM 09/24/2022   11:41 AM 09/11/2021   11:47 AM 09/05/2020    1:06 PM 02/10/2020    2:24 PM 08/13/2019    2:14 PM 04/24/2019   12:57 PM  Advanced Directives  Does Patient Have a Medical Advance Directive? No No No No No No Yes  Type of Advance Directive       Living will;Healthcare Power of Attorney  Does patient want to make changes to medical advance directive?       No - Patient declined  Copy of Healthcare Power of Attorney in Chart?       No - copy requested  Would patient like information on creating a medical advance directive? No - Patient declined No - Patient declined No - Patient  declined No - Patient declined No - Patient declined No - Patient declined     Current Medications (verified) Outpatient Encounter Medications as of 09/30/2023  Medication Sig   amLODipine (NORVASC) 5 MG tablet Take 1 tablet (5 mg total) by mouth daily.   Ascorbic Acid (VITAMIN C PO) Take by mouth.   atorvastatin (LIPITOR) 20 MG tablet TAKE ONE TABLET BY MOUTH ON MONDAY, WEDNESDAY, FRIDAY, AND SUNDAY   baclofen (LIORESAL) 10 MG tablet TAKE 1 TABLET BY MOUTH THREE TIMES A DAY AS NEEDED FOR MUSCLE SPASMS   ferrous sulfate 324 MG TBEC Take 324 mg by mouth.   FLUZONE HIGH-DOSE 0.5 ML injection    metoprolol tartrate (LOPRESSOR) 25 MG tablet TAKE ONE-HALF (0.5) TABLET BY MOUTH TWO TIMES A DAY   Multiple Vitamin (MULTIVITAMIN) tablet Take 1 tablet by mouth daily.   tamsulosin (FLOMAX) 0.4 MG CAPS capsule TAKE ONE CAPSULE BY MOUTH EVERY NIGHT AT BEDTIME   VITAMIN D PO Take by mouth.   ABRYSVO 120 MCG/0.5ML injection  (Patient not taking: Reported on 09/24/2023)   COMIRNATY syringe    FLUZONE HIGH-DOSE QUADRIVALENT 0.7 ML SUSY    HYDROcodone-acetaminophen (NORCO) 5-325 MG tablet Take 1-2 tablets by mouth every 6 (six) hours as needed for moderate pain or severe pain (pain from renal stones). (Patient not taking: Reported on 09/30/2023)   No facility-administered encounter medications on file as of 09/30/2023.    Allergies (verified) Angiotensin receptor blockers, Ciprofloxacin,  and Pyridium [phenazopyridine]   History: Past Medical History:  Diagnosis Date   Aneurysm (HCC)    left common iliac-stable   Arthritis    Fasting hyperglycemia    Follicular lymphoma (HCC) dx'd 07/2018   History of kidney stones    Hyperlipidemia    LDL goal = < 100 based on NMR Lipoproprofile   Hypertension    Mass of left lower extremity    inner thigh   Nephrolithiasis    left    Renal insufficiency    Sepsis (HCC) 03/24/2013   Squamous cell carcinoma, face    Dr Irene Limbo   Ureteral stone with hydronephrosis  03/24/2013   left   Past Surgical History:  Procedure Laterality Date   CATARACT EXTRACTION  2005   OD    COLONOSCOPY W/ POLYPECTOMY  2007   adenoma X2 ; [facility]   CYSTOSCOPY W/ URETERAL STENT PLACEMENT Left 03/23/2013   Procedure: CYSTOSCOPY WITH RETROGRADE PYELOGRAM/URETERAL STENT PLACEMENT;  Surgeon: Antony Haste, MD;  Location: [facility];  Service: Urology;  Laterality: Left;   CYSTOSCOPY WITH RETROGRADE PYELOGRAM, URETEROSCOPY AND STENT PLACEMENT Bilateral 07/15/2018   Procedure: CYSTOSCOPY WITH RETROGRADE PYELOGRAM, URETEROSCOPY;  Surgeon: Bjorn Pippin, MD;  Location: [facility];  Service: Urology;  Laterality: Bilateral;   CYSTOSCOPY WITH STENT PLACEMENT Left 07/15/2018   Procedure: CYSTOSCOPY WITH STENT PLACEMENT;  Surgeon: Bjorn Pippin, MD;  [facility];  Service: Urology;  Laterality: Left;   CYSTOSCOPY WITH URETEROSCOPY  02/28/2012   Procedure: CYSTOSCOPY WITH URETEROSCOPY;  Surgeon: Anner Crete, MD;  [facility];  Service: Urology;  Laterality: Left;   CYSTOSCOPY/URETEROSCOPY/HOLMIUM LASER/STENT PLACEMENT Right 07/31/2018   Procedure: CYSTOSCOPY STENT REMOVAL  RIGHT URETEROSCOPY WITH HOLMIUM LASER POSSIBLE STENT PLACEMENT;  Surgeon: Bjorn Pippin, MD;  Location: [facility];  Service: Urology;  Laterality: Right;   HOLMIUM LASER APPLICATION Left 03/31/2013   Procedure: HOLMIUM LASER APPLICATION;  Surgeon: Anner Crete, MD;  [facility];  Service: Urology;  Laterality: Left;   HOLMIUM LASER APPLICATION Right 07/15/2018   Procedure: HOLMIUM LASER APPLICATION;  Surgeon: Bjorn Pippin, MD;  [facility];  Service: Urology;  Laterality: Right;   INGUINAL LYMPH NODE BIOPSY Left 09/16/2018   Procedure: LEFT INGUINAL LYMPH NODE BIOPSY;  Surgeon: Harriette Bouillon, MD;  Location: [facility];  Service: General;  Laterality: Left;   LITHOTRIPSY      X2 w/o benefit   NEPHROLITHOTOMY  02/28/2012   Procedure: NEPHROLITHOTOMY PERCUTANEOUS;  Surgeon: Anner Crete, MD;  [facility];  Service: Urology;  Laterality: Left;   TONSILLECTOMY     URETEROSCOPY Left 03/31/2013   Procedure: URETEROSCOPY;  Surgeon: Anner Crete, MD;  [facility];  Service: Urology;  Laterality: Left;   VASECTOMY     Family History  Problem Relation Age of Onset   Heart attack Father 58        CBAG X5 vessels   Hyperlipidemia Father    Hypertension Father    Heart disease Father        before age 80   Hypertension Brother    Aortic aneurysm Paternal Uncle        AAA ruptured   Heart disease Mother    Hyperlipidemia Mother    Hypertension Mother    Diabetes Neg Hx    Social History   Socioeconomic History   Marital status: Married    Spouse name: Not on file   Number of children: 2  Years of education: 99   Highest education level: Not on file  Occupational History   Occupation: Research scientist (medical) (retired)    Associate Professor: [employer]  Tobacco Use   Smoking status: Never   Smokeless tobacco: Never  Vaping Use   Vaping status: Never Used  Substance and Sexual Activity   Alcohol use: Yes    Comment:  rarely   Drug use: No   Sexual activity: Not on file  Other Topics Concern   Not on file  Social History Narrative   Fun: Work around the house and work out.    Social Drivers of Corporate investment banker Strain: Low Risk  (09/30/2023)   Overall Financial Resource Strain (CARDIA)    Difficulty of Paying Living Expenses: Not hard at all  Food Insecurity: No Food Insecurity (09/30/2023)   Hunger Vital Sign    Worried About Running Out of Food in the Last Year: Never true    Ran Out of Food in the Last Year: Never true  Transportation Needs: No Transportation Needs (09/30/2023)   PRAPARE - Administrator, Civil Service (Medical): No    Lack of Transportation (Non-Medical): No  Physical Activity: Sufficiently Active (09/30/2023)   Exercise Vital Sign    Days of Exercise per Week: 7 days    Minutes of Exercise per Session: 150+ min   Stress: No Stress Concern Present (09/30/2023)   [employer] of Occupational Health - Occupational Stress Questionnaire    Feeling of Stress : Not at all  Social Connections: Moderately Isolated (09/30/2023)   Social Connection and Isolation Panel [NHANES]    Frequency of Communication with Friends and Family: More than three times a week    Frequency of Social Gatherings with Friends and Family: More than three times a week    Attends Religious Services: Never    Database administrator or Organizations: No    Attends Engineer, structural: Never    Marital Status: Married    Tobacco Counseling Counseling given: Not Answered    Clinical Intake:  Pre-visit preparation completed: Yes  Pain : No/denies pain     BMI - recorded: 24.8 Nutritional Status: BMI of 19-24  Normal Nutritional Risks: None Diabetes: No  How often do you need to have someone help you when you read instructions, pamphlets, or other written materials from your doctor or pharmacy?: 1 - Never  Interpreter Needed?: No  Information entered by :: Lanier Ensign, LPN   Activities of Daily Living     09/29/2023    9:56 PM  In your present state of health, do you have any difficulty performing the following activities:  Hearing? 0  Vision? 0  Difficulty concentrating or making decisions? 0  Walking or climbing stairs? 0  Dressing or bathing? 0  Doing errands, shopping? 0  Preparing Food and eating ? N  Using the Toilet? N  In the past six months, have you accidently leaked urine? N  Do you have problems with loss of bowel control? N  Managing your Medications? N  Managing your Finances? N  Housekeeping or managing your Housekeeping? N    Patient Care Team: Ardith Dark, MD as PCP - General (Family Medicine) Johney Maine, MD as Consulting Physician (Hematology) Bjorn Pippin, MD as Attending Physician (Urology) Elliot Cousin, OD as Consulting Physician (Optometry) Dahlia Byes, [facility] as Pharmacist (Pharmacist)  Indicate any recent Medical Services you may have received from other than [facility] providers in  the past year (date may be approximate).     Assessment:   This is a routine wellness examination for [place].  Hearing/Vision screen Hearing Screening - Comments:: Pt denies any hearing issues  Vision Screening - Comments:: Pt follows up with eye provider [address] of [address] in [place] for annual eye exam    Goals Addressed             This Visit's Progress    Patient Stated       Aim for 30 minutes of exercise or brisk walking, 6-8 glasses of water, and 5 servings of fruits and vegetables each day.        Depression Screen     09/30/2023    2:26 PM 09/24/2023    9:45 AM 09/24/2022   11:41 AM 09/21/2022   10:14 AM 09/11/2021   11:46 AM 09/05/2020    1:04 PM 07/05/2020   10:40 AM  PHQ 2/9 Scores  PHQ - 2 Score 0 0 0 0 0 0 0    Fall Risk     09/29/2023    9:56 PM 09/24/2023    9:44 AM 09/24/2022   11:42 AM 09/21/2022   10:14 AM 09/11/2021   11:47 AM  Fall Risk   Falls in the past year? 0 0 0 0 0  Number falls in past yr: 0 0 0 0 0  Injury with Fall? 0 0 0 0 0  Risk for fall due to : No Fall Risks No Fall Risks Impaired vision No Fall Risks Impaired vision  Follow up Falls prevention discussed  Falls prevention discussed  Falls prevention discussed    MEDICARE RISK AT HOME:  Medicare Risk at Home Any stairs in or around the home?: (Patient-Rptd) Yes If so, are there any without handrails?: (Patient-Rptd) No Home free of loose throw rugs in walkways, pet beds, electrical cords, etc?: (Patient-Rptd) No Adequate lighting in your home to reduce risk of falls?: (Patient-Rptd) Yes Life alert?: (Patient-Rptd) No Use of a cane, walker or w/c?: (Patient-Rptd) No Grab bars in the bathroom?: (Patient-Rptd) Yes Shower chair or bench in shower?: (Patient-Rptd) No Elevated toilet seat or a handicapped toilet?: (Patient-Rptd) No  TIMED UP AND GO:  Was  the test performed?  No  Cognitive Function: 6CIT completed        09/30/2023    2:28 PM 09/24/2022   11:43 AM 09/11/2021   11:49 AM 09/05/2020    1:08 PM  6CIT Screen  What Year? 0 points 0 points 0 points 0 points  What month? 0 points 0 points 0 points 0 points  What time? 0 points 0 points 0 points   Count back from 20 0 points 0 points 0 points 0 points  Months in reverse 0 points 0 points 0 points 0 points  Repeat phrase 0 points 0 points 0 points 0 points  Total Score 0 points 0 points 0 points     Immunizations Immunization History  Administered Date(s) Administered   Influenza Whole 04/29/2012   Influenza, High Dose Seasonal PF 05/15/2013, 05/24/2016, 06/04/2017, 05/26/2018, 04/24/2019   Influenza,inj,Quad PF,6+ Mos 07/04/2015   Influenza,inj,quad, With Preservative 05/16/2020   Influenza-Unspecified 04/24/2019, 06/21/2021, 04/26/2022   PFIZER(Purple Top)SARS-COV-2 Vaccination 09/05/2019, 09/26/2019, 05/10/2020, 01/12/2021, 08/04/2021   Pfizer(Comirnaty)Fall Seasonal Vaccine 12 years and older 04/26/2022   RSV,unspecified 06/11/2022   Tdap 07/04/2015   Zoster Recombinant(Shingrix) 05/16/2020   Zoster, Unspecified 05/29/2021    Screening Tests Health Maintenance  Topic Date Due   Pneumonia Vaccine 65+  Years old (1 of 2 - PCV) Never done   COVID-19 Vaccine (7 - 2024-25 season) 10/10/2023 (Originally 03/31/2023)   INFLUENZA VACCINE  10/28/2023 (Originally 02/28/2023)   Fecal DNA (Cologuard)  11/17/2023   Medicare Annual Wellness (AWV)  09/29/2024   DTaP/Tdap/Td (2 - Td or Tdap) 07/03/2025   Hepatitis C Screening  Completed   Zoster Vaccines- Shingrix  Completed   HPV VACCINES  Aged Out    Health Maintenance  Health Maintenance Due  Topic Date Due   Pneumonia Vaccine 35+ Years old (1 of 2 - PCV) Never done   Health Maintenance Items Addressed: See Nurse Notes  Additional Screening:  Vision Screening: Recommended annual ophthalmology exams for early detection  of glaucoma and other disorders of the eye.  Dental Screening: Recommended annual dental exams for proper oral hygiene  Community Resource Referral / Chronic Care Management: CRR required this visit?  No   CCM required this visit?  No     Plan:     I have personally reviewed and noted the following in the patient's chart:   Medical and social history Use of alcohol, tobacco or illicit drugs  Current medications and supplements including opioid prescriptions. Patient is not currently taking opioid prescriptions. Functional ability and status Nutritional status Physical activity Advanced directives List of other physicians Hospitalizations, surgeries, and ER visits in previous 12 months Vitals Screenings to include cognitive, depression, and falls Referrals and appointments  In addition, I have reviewed and discussed with patient certain preventive protocols, quality metrics, and best practice recommendations. A written personalized care plan for preventive services as well as general preventive health recommendations were provided to patient.     Marzella Schlein, LPN   08/04/1094   After Visit Summary: (MyChart) Due to this being a telephonic visit, the after visit summary with patients personalized plan was offered to patient via MyChart   Notes: Please refer to Routing Comments.

## 2023-09-30 NOTE — Patient Instructions (Signed)
 Richard [name] , Thank you for taking time to come for your Medicare Wellness Visit. I appreciate your ongoing commitment to your health goals. Please review the following plan we discussed and let me know if I can assist you in the future.   Referrals/Orders/Follow-Ups/Clinician Recommendations: maintain health and activity  This is a list of the screening recommended for you and due dates:  Health Maintenance  Topic Date Due   Pneumonia Vaccine (1 of 2 - PCV) Never done   Medicare Annual Wellness Visit  09/25/2023   COVID-19 Vaccine (7 - 2024-25 season) 10/10/2023*   Flu Shot  10/28/2023*   Cologuard (Stool DNA test)  11/17/2023   DTaP/Tdap/Td vaccine (2 - Td or Tdap) 07/03/2025   Hepatitis C Screening  Completed   Zoster (Shingles) Vaccine  Completed   HPV Vaccine  Aged Out  *Topic was postponed. The date shown is not the original due date.    Advanced directives: (Declined) Advance directive discussed with you today. Even though you declined this today, please call our office should you change your mind, and we can give you the proper paperwork for you to fill out.  Next Medicare Annual Wellness Visit scheduled for next year: Yes

## 2023-10-08 DIAGNOSIS — Z1211 Encounter for screening for malignant neoplasm of colon: Secondary | ICD-10-CM | POA: Diagnosis not present

## 2023-10-10 DIAGNOSIS — L821 Other seborrheic keratosis: Secondary | ICD-10-CM | POA: Diagnosis not present

## 2023-10-10 DIAGNOSIS — D1801 Hemangioma of skin and subcutaneous tissue: Secondary | ICD-10-CM | POA: Diagnosis not present

## 2023-10-10 DIAGNOSIS — Z85828 Personal history of other malignant neoplasm of skin: Secondary | ICD-10-CM | POA: Diagnosis not present

## 2023-10-10 DIAGNOSIS — L820 Inflamed seborrheic keratosis: Secondary | ICD-10-CM | POA: Diagnosis not present

## 2023-10-10 DIAGNOSIS — D2272 Melanocytic nevi of left lower limb, including hip: Secondary | ICD-10-CM | POA: Diagnosis not present

## 2023-10-10 DIAGNOSIS — L814 Other melanin hyperpigmentation: Secondary | ICD-10-CM | POA: Diagnosis not present

## 2023-10-10 DIAGNOSIS — C44311 Basal cell carcinoma of skin of nose: Secondary | ICD-10-CM | POA: Diagnosis not present

## 2023-10-10 DIAGNOSIS — D485 Neoplasm of uncertain behavior of skin: Secondary | ICD-10-CM | POA: Diagnosis not present

## 2023-10-13 LAB — COLOGUARD: COLOGUARD: NEGATIVE

## 2023-10-14 NOTE — Progress Notes (Signed)
Great news! Cologuard is negative. We can recheck in 3 years.

## 2023-10-16 ENCOUNTER — Ambulatory Visit (HOSPITAL_COMMUNITY)
Admission: RE | Admit: 2023-10-16 | Discharge: 2023-10-16 | Disposition: A | Discharge status: Home / Self Care | Payer: Self-pay | Source: Ambulatory Visit | Attending: Family Medicine | Admitting: Family Medicine

## 2023-10-16 DIAGNOSIS — E782 Mixed hyperlipidemia: Secondary | ICD-10-CM | POA: Insufficient documentation

## 2023-10-18 ENCOUNTER — Encounter: Payer: Self-pay | Admitting: Family Medicine

## 2023-10-18 NOTE — Progress Notes (Signed)
 His cardiac CT scan shows a small amount of calcium buildup in one of the arteries in his heart.  This is common for his age and his numbers are much better than average for a man his age.  We do not need to make any significant changes to his treatment plan at this time.  He should continue with his current dose of Lipitor.  We can recheck his labs again in a year

## 2023-10-23 ENCOUNTER — Encounter: Payer: Self-pay | Admitting: Urology

## 2023-10-23 ENCOUNTER — Ambulatory Visit: Payer: Medicare Other | Admitting: Urology

## 2023-10-23 VITALS — BP 150/88 | HR 49 | Ht 73.0 in | Wt 180.0 lb

## 2023-10-23 DIAGNOSIS — R310 Gross hematuria: Secondary | ICD-10-CM

## 2023-10-23 DIAGNOSIS — N200 Calculus of kidney: Secondary | ICD-10-CM | POA: Diagnosis not present

## 2023-10-23 LAB — URINALYSIS, ROUTINE W REFLEX MICROSCOPIC
Bilirubin, UA: NEGATIVE
Glucose, UA: NEGATIVE
Ketones, UA: NEGATIVE
Leukocytes,UA: NEGATIVE
Nitrite, UA: NEGATIVE
Protein,UA: NEGATIVE
Specific Gravity, UA: 1.01 (ref 1.005–1.030)
Urobilinogen, Ur: 0.2 mg/dL (ref 0.2–1.0)
pH, UA: 5.5 (ref 5.0–7.5)

## 2023-10-23 LAB — MICROSCOPIC EXAMINATION

## 2023-10-23 NOTE — Progress Notes (Signed)
 Assessment: 1. Gross hematuria   2. Nephrolithiasis     Plan: I personally reviewed the patient's chart including provider notes, lab and imaging results. Today I had a discussion with the patient regarding the findings of gross hematuria including the implications and differential diagnoses associated with it.  I also discussed recommendations for further evaluation including the rationale for upper tract imaging and cystoscopy.  I discussed the nature of these procedures including potential risk and complications.  The patient expressed an understanding of these issues. Schedule for CT hematuria study followed by cystoscopy.   Chief Complaint:  Chief Complaint  Patient presents with   Hematuria    History of Present Illness:  Richard [name] is a 78 y.o. male who is seen for evaluation of nephrolithiasis and gross hematuria. He has a history of nephrolithiasis.  He was previously followed by Dr. Annabell Howells at [facility].  His last visit was in April 2023. CT imaging from 12/20/2021 showed bilateral nephrolithiasis, left-sided pelviectasis without obstructing stone or urethral lesion, and borderline enlarged left periaortic lymph node.  He did not return for cystoscopy for evaluation of the gross hematuria. He has had recurrent episodes of gross hematuria.  These episodes typically occur after he walks on the treadmill.  The hematuria resolves after several voids.  He does not have any associated flank pain or dysuria.  The symptoms have been going on for at least a year.  CT imaging from 11/24 shows bilateral nonobstructing renal calculi without renal mass or obstruction.  PSA from 2/24: 1.75  Past Medical History:  Past Medical History:  Diagnosis Date   Aneurysm (HCC)    left common iliac-stable   Arthritis    Fasting hyperglycemia    Follicular lymphoma (HCC) dx'd 07/2018   History of kidney stones    Hyperlipidemia    LDL goal = < 100 based on NMR Lipoproprofile    Hypertension    Mass of left lower extremity    inner thigh   Nephrolithiasis    left    Renal insufficiency    Sepsis (HCC) 03/24/2013   Squamous cell carcinoma, face    Dr Irene Limbo   Ureteral stone with hydronephrosis 03/24/2013   left    Past Surgical History:  Past Surgical History:  Procedure Laterality Date   CATARACT EXTRACTION  2005   OD    COLONOSCOPY W/ POLYPECTOMY  2007   adenoma X2 ; [facility]   CYSTOSCOPY W/ URETERAL STENT PLACEMENT Left 03/23/2013   Procedure: CYSTOSCOPY WITH RETROGRADE PYELOGRAM/URETERAL STENT PLACEMENT;  Surgeon: Antony Haste, MD;  [facility];  Service: Urology;  Laterality: Left;   CYSTOSCOPY WITH RETROGRADE PYELOGRAM, URETEROSCOPY AND STENT PLACEMENT Bilateral 07/15/2018   Procedure: CYSTOSCOPY WITH RETROGRADE PYELOGRAM, URETEROSCOPY;  Surgeon: Bjorn Pippin, MD;  [facility];  Service: Urology;  Laterality: Bilateral;   CYSTOSCOPY WITH STENT PLACEMENT Left 07/15/2018   Procedure: CYSTOSCOPY WITH STENT PLACEMENT;  Surgeon: Bjorn Pippin, MD;  [facility];  Service: Urology;  Laterality: Left;   CYSTOSCOPY WITH URETEROSCOPY  02/28/2012   Procedure: CYSTOSCOPY WITH URETEROSCOPY;  Surgeon: Anner Crete, MD;  [facility];  Service: Urology;  Laterality: Left;   CYSTOSCOPY/URETEROSCOPY/HOLMIUM LASER/STENT PLACEMENT Right 07/31/2018   Procedure: CYSTOSCOPY STENT REMOVAL  RIGHT URETEROSCOPY WITH HOLMIUM LASER POSSIBLE STENT PLACEMENT;  Surgeon: Bjorn Pippin, MD;  Location: [facility];  Service: Urology;  Laterality: Right;   HOLMIUM LASER APPLICATION Left 03/31/2013   Procedure: HOLMIUM LASER APPLICATION;  Surgeon: Anner Crete, MD;  [facility];  Service: Urology;  Laterality: Left;   HOLMIUM LASER APPLICATION Right 07/15/2018   Procedure: HOLMIUM LASER APPLICATION;  Surgeon: Bjorn Pippin, MD;  [facility];  Service: Urology;  Laterality: Right;   INGUINAL LYMPH NODE BIOPSY Left 09/16/2018   Procedure: LEFT  INGUINAL LYMPH NODE BIOPSY;  Surgeon: Harriette Bouillon, MD;  Location: [facility];  Service: General;  Laterality: Left;   LITHOTRIPSY      X2 w/o benefit   NEPHROLITHOTOMY  02/28/2012   Procedure: NEPHROLITHOTOMY PERCUTANEOUS;  Surgeon: Anner Crete, MD;  [facility];  Service: Urology;  Laterality: Left;   TONSILLECTOMY     URETEROSCOPY Left 03/31/2013   Procedure: URETEROSCOPY;  Surgeon: Anner Crete, MD;  Location[facility];  Service: Urology;  Laterality: Left;   VASECTOMY      Allergies:  Allergies  Allergen Reactions   Angiotensin Receptor Blockers     Angioedema with ACE-I   Ciprofloxacin     Rash on combination of Cipro & Pyridium after cystoscopic & open resection of renal calculi   Pyridium [Phenazopyridine]     In combo with Cipro    Family History:  Family History  Problem Relation Age of Onset   Heart attack Father 40        CBAG X5 vessels   Hyperlipidemia Father    Hypertension Father    Heart disease Father        before age 9   Hypertension Brother    Aortic aneurysm Paternal Uncle        AAA ruptured   Heart disease Mother    Hyperlipidemia Mother    Hypertension Mother    Diabetes Neg Hx     Social History:  Social History   Tobacco Use   Smoking status: Never   Smokeless tobacco: Never  Vaping Use   Vaping status: Never Used  Substance Use Topics   Alcohol use: Yes    Comment:  rarely   Drug use: No    Review of symptoms:  Constitutional:  Negative for unexplained weight loss, night sweats, fever, chills ENT:  Negative for nose bleeds, sinus pain, painful swallowing CV:  Negative for chest pain, shortness of breath, exercise intolerance, palpitations, loss of consciousness Resp:  Negative for cough, wheezing, shortness of breath GI:  Negative for nausea, vomiting, diarrhea, bloody stools GU:  Positives noted in HPI; otherwise negative for dysuria, urinary incontinence Neuro:  Negative for seizures, poor balance, limb  weakness, slurred speech Psych:  Negative for lack of energy, depression, anxiety Endocrine:  Negative for polydipsia, polyuria, symptoms of hypoglycemia (dizziness, hunger, sweating) Hematologic:  Negative for anemia, purpura, petechia, prolonged or excessive bleeding, use of anticoagulants  Allergic:  Negative for difficulty breathing or choking as a result of exposure to anything; no shellfish allergy; no allergic response (rash/itch) to materials, foods  Physical exam: BP (!) 150/88   Pulse (!) 49   Ht 6\' 1"  (1.854 m)   Wt 180 lb (81.6 kg)   BMI 23.75 kg/m  GENERAL APPEARANCE:  Well appearing, well developed, well nourished, NAD HEENT: Atraumatic, Normocephalic, oropharynx clear. NECK: Supple without lymphadenopathy or thyromegaly. LUNGS: Clear to auscultation bilaterally. HEART: Regular Rate and Rhythm without murmurs, gallops, or rubs. ABDOMEN: Soft, non-tender, No Masses. EXTREMITIES: Moves all extremities well.  Without clubbing, cyanosis, or edema. NEUROLOGIC:  Alert and oriented x 3, normal gait, CN II-XII grossly intact.  MENTAL STATUS:  Appropriate. BACK:  Non-tender to palpation.  No CVAT SKIN:  Warm, dry and intact.   GU: Penis:  circumcised Meatus: Normal Scrotum: normal, no masses Testis: normal without masses bilateral Prostate: 30 g, NT, no nodules Rectum: Normal tone,  no masses or tenderness   Results: U/A: 0-5 WBC, 3-10 RBC

## 2023-10-25 ENCOUNTER — Telehealth: Payer: Self-pay | Admitting: Urology

## 2023-10-25 NOTE — Telephone Encounter (Signed)
 Called to schedule a follow up  cystoscopy after CT completed per Dr Pete Glatter. Ct Scheduled for 11/20/23.

## 2023-11-14 DIAGNOSIS — C44311 Basal cell carcinoma of skin of nose: Secondary | ICD-10-CM | POA: Diagnosis not present

## 2023-11-20 ENCOUNTER — Encounter (HOSPITAL_BASED_OUTPATIENT_CLINIC_OR_DEPARTMENT_OTHER): Payer: Self-pay

## 2023-11-20 ENCOUNTER — Ambulatory Visit (HOSPITAL_BASED_OUTPATIENT_CLINIC_OR_DEPARTMENT_OTHER)
Admission: RE | Admit: 2023-11-20 | Discharge: 2023-11-20 | Disposition: A | Discharge status: Home / Self Care | Source: Ambulatory Visit | Attending: Urology | Admitting: Urology

## 2023-11-20 DIAGNOSIS — R310 Gross hematuria: Secondary | ICD-10-CM | POA: Diagnosis not present

## 2023-11-20 DIAGNOSIS — N200 Calculus of kidney: Secondary | ICD-10-CM | POA: Insufficient documentation

## 2023-11-20 DIAGNOSIS — I723 Aneurysm of iliac artery: Secondary | ICD-10-CM | POA: Diagnosis not present

## 2023-11-20 LAB — POCT I-STAT CREATININE: Creatinine, Ser: 1.4 mg/dL — ABNORMAL HIGH (ref 0.61–1.24)

## 2023-11-20 MED ORDER — IOHEXOL 300 MG/ML  SOLN
100.0000 mL | Freq: Once | INTRAMUSCULAR | Status: AC | PRN
  Administered 2023-11-20: 125 mL via INTRAVENOUS

## 2023-11-21 DIAGNOSIS — D485 Neoplasm of uncertain behavior of skin: Secondary | ICD-10-CM | POA: Diagnosis not present

## 2023-11-21 DIAGNOSIS — D2272 Melanocytic nevi of left lower limb, including hip: Secondary | ICD-10-CM | POA: Diagnosis not present

## 2023-12-11 ENCOUNTER — Ambulatory Visit: Admitting: Urology

## 2023-12-11 ENCOUNTER — Encounter: Payer: Self-pay | Admitting: Urology

## 2023-12-11 VITALS — BP 145/82 | HR 51 | Ht 72.0 in | Wt 180.0 lb

## 2023-12-11 DIAGNOSIS — R310 Gross hematuria: Secondary | ICD-10-CM | POA: Diagnosis not present

## 2023-12-11 DIAGNOSIS — N2889 Other specified disorders of kidney and ureter: Secondary | ICD-10-CM

## 2023-12-11 DIAGNOSIS — N200 Calculus of kidney: Secondary | ICD-10-CM

## 2023-12-11 LAB — URINALYSIS, ROUTINE W REFLEX MICROSCOPIC
Bilirubin, UA: NEGATIVE
Glucose, UA: NEGATIVE
Ketones, UA: NEGATIVE
Leukocytes,UA: NEGATIVE
Nitrite, UA: NEGATIVE
Protein,UA: NEGATIVE
RBC, UA: NEGATIVE
Specific Gravity, UA: 1.015 (ref 1.005–1.030)
Urobilinogen, Ur: 0.2 mg/dL (ref 0.2–1.0)
pH, UA: 5.5 (ref 5.0–7.5)

## 2023-12-11 MED ORDER — SULFAMETHOXAZOLE-TRIMETHOPRIM 800-160 MG PO TABS
1.0000 | ORAL_TABLET | Freq: Once | ORAL | Status: AC
  Administered 2023-12-11: 1 via ORAL

## 2023-12-11 NOTE — Progress Notes (Signed)
 Assessment: 1. Gross hematuria   2. Nephrolithiasis   3. Renal mass, left     Plan: I personally reviewed the CT study from 11/20/2023 with results as noted below. I discussed the findings of a small incidentally found left renal mass and the potential implications of this.  I discussed options including surveillance, partial nephrectomy, and percutaneous ablation.  The mass does appear to be stable in comparison to the study from November 2024.  I think it would be reasonable to begin surveillance at this time. I discussed the findings of left nephrolithiasis.  I discussed options for management of the left renal calculus including continued observation, shockwave lithotripsy, and ureteroscopic laser lithotripsy.  Urine cytology sent. Bactrim DS x 1 following cystoscopy. Will contact him with results of the urine cytology and to discuss further management of the renal mass and nephrolithiasis.   Chief Complaint:  Chief Complaint  Patient presents with   Hematuria    History of Present Illness:  Richard [name] is a 78 y.o. male who is seen for further evaluation of nephrolithiasis and gross hematuria. He has a history of nephrolithiasis.  He was previously followed by Dr. Inga Manges at [facility].  His last visit was in April 2023. CT imaging from 12/20/2021 showed bilateral nephrolithiasis, left-sided pelviectasis without obstructing stone or urethral lesion, and borderline enlarged left periaortic lymph node.  He did not return for cystoscopy for evaluation of the gross hematuria. He has had recurrent episodes of gross hematuria.  These episodes typically occur after he walks on the treadmill.  The hematuria resolves after several voids.  He does not have any associated flank pain or dysuria.  The symptoms have been going on for at least a year.  CT imaging from 11/24 shows bilateral nonobstructing renal calculi without renal mass or obstruction.  PSA from 2/24: 1.75  CT  hematuria protocol from 11/20/2023 showed a 9 mm enhancing subcapsular mass in the midpole of the left kidney consistent with a small renal cell carcinoma, stable bilateral nephrolithiasis with a 16 mm calculus in the left mid collecting system and mild urothelial thickening of the left renal pelvis.  He presents today for further evaluation with cystoscopy. He reports that he has not had any further episodes of gross hematuria since his last visit.  No flank pain.  Portions of the above documentation were copied from a prior visit for review purposes only.   Past Medical History:  Past Medical History:  Diagnosis Date   Aneurysm (HCC)    left common iliac-stable   Arthritis    Fasting hyperglycemia    Follicular lymphoma (HCC) dx'd 07/2018   History of kidney stones    Hyperlipidemia    LDL goal = < 100 based on NMR Lipoproprofile   Hypertension    Mass of left lower extremity    inner thigh   Nephrolithiasis    left    Renal insufficiency    Sepsis (HCC) 03/24/2013   Squamous cell carcinoma, face    Dr Jerilynn Montenegro   Ureteral stone with hydronephrosis 03/24/2013   left    Past Surgical History:  Past Surgical History:  Procedure Laterality Date   CATARACT EXTRACTION  2005   OD    COLONOSCOPY W/ POLYPECTOMY  2007   adenoma X2 ; [facility]   CYSTOSCOPY W/ URETERAL STENT PLACEMENT Left 03/23/2013   Procedure: CYSTOSCOPY WITH RETROGRADE PYELOGRAM/URETERAL STENT PLACEMENT;  Surgeon: Moise Anes, MD;  Location: [facility];  Service: Urology;  Laterality: Left;   CYSTOSCOPY WITH RETROGRADE PYELOGRAM, URETEROSCOPY AND STENT PLACEMENT Bilateral 07/15/2018   Procedure: CYSTOSCOPY WITH RETROGRADE PYELOGRAM, URETEROSCOPY;  Surgeon: Homero Luster, MD;  Location: [facility];  Service: Urology;  Laterality: Bilateral;   CYSTOSCOPY WITH STENT PLACEMENT Left 07/15/2018   Procedure: CYSTOSCOPY WITH STENT PLACEMENT;  Surgeon: Homero Luster, MD;  Location: [facility];  Service: Urology;  Laterality:  Left;   CYSTOSCOPY WITH URETEROSCOPY  02/28/2012   Procedure: CYSTOSCOPY WITH URETEROSCOPY;  Surgeon: Willye Harvey, MD;  Location: [facility];  Service: Urology;  Laterality: Left;   CYSTOSCOPY/URETEROSCOPY/HOLMIUM LASER/STENT PLACEMENT Right 07/31/2018   Procedure: CYSTOSCOPY STENT REMOVAL  RIGHT URETEROSCOPY WITH HOLMIUM LASER POSSIBLE STENT PLACEMENT;  Surgeon: Homero Luster, MD;  Location: [facility];  Service: Urology;  Laterality: Right;   HOLMIUM LASER APPLICATION Left 03/31/2013   Procedure: HOLMIUM LASER APPLICATION;  Surgeon: Willye Harvey, MD;  Location: [facility];  Service: Urology;  Laterality: Left;   HOLMIUM LASER APPLICATION Right 07/15/2018   Procedure: HOLMIUM LASER APPLICATION;  Surgeon: Homero Luster, MD;  Location: [facility];  Service: Urology;  Laterality: Right;   INGUINAL LYMPH NODE BIOPSY Left 09/16/2018   Procedure: LEFT INGUINAL LYMPH NODE BIOPSY;  Surgeon: Sim Dryer, MD;  Location: [facility];  Service: General;  Laterality: Left;   LITHOTRIPSY      X2 w/o benefit   NEPHROLITHOTOMY  02/28/2012   Procedure: NEPHROLITHOTOMY PERCUTANEOUS;  Surgeon: Willye Harvey, MD;  Location: [facility];  Service: Urology;  Laterality: Left;   TONSILLECTOMY     URETEROSCOPY Left 03/31/2013   Procedure: URETEROSCOPY;  Surgeon: Willye Harvey, MD;  Location: [facility];  Service: Urology;  Laterality: Left;   VASECTOMY      Allergies:  Allergies  Allergen Reactions   Angiotensin Receptor Blockers     Angioedema with ACE-I   Ciprofloxacin      Rash on combination of Cipro  & Pyridium  after cystoscopic & open resection of renal calculi   Pyridium  [Phenazopyridine ]     In combo with Cipro     Family History:  Family History  Problem Relation Age of Onset   Heart attack Father 23        CBAG X5 vessels   Hyperlipidemia Father    Hypertension Father    Heart disease Father        before age 28   Hypertension Brother    Aortic aneurysm Paternal Uncle        AAA ruptured    Heart disease Mother    Hyperlipidemia Mother    Hypertension Mother    Diabetes Neg Hx     Social History:  Social History   Tobacco Use   Smoking status: Never   Smokeless tobacco: Never  Vaping Use   Vaping status: Never Used  Substance Use Topics   Alcohol use: Yes    Comment:  rarely   Drug use: No    ROS: Constitutional:  Negative for fever, chills, weight loss CV: Negative for chest pain, previous MI, hypertension Respiratory:  Negative for shortness of breath, wheezing, sleep apnea, frequent cough GI:  Negative for nausea, vomiting, bloody stool, GERD  Physical exam: BP (!) 145/82   Pulse (!) 51   Ht 6' (1.829 m)   Wt 180 lb (81.6 kg)   BMI 24.41 kg/m  GENERAL APPEARANCE:  Well appearing, well developed, well nourished, NAD HEENT:  Atraumatic, normocephalic, oropharynx clear NECK:  Supple without lymphadenopathy or thyromegaly ABDOMEN:  Soft, non-tender,  no masses EXTREMITIES:  Moves all extremities well, without clubbing, cyanosis, or edema NEUROLOGIC:  Alert and oriented x 3, normal gait, CN II-XII grossly intact MENTAL STATUS:  appropriate BACK:  Non-tender to palpation, No CVAT SKIN:  Warm, dry, and intact   Results: U/A: negative  Procedure:  Flexible Cystourethroscopy  Pre-operative Diagnosis: Gross hematuria  Post-operative Diagnosis: Gross hematuria  Anesthesia:  local with lidocaine  jelly  Surgical Narrative:  After appropriate informed consent was obtained, the patient was prepped and draped in the usual sterile fashion in the supine position.  The patient was correctly identified and the proper procedure delineated prior to proceeding.  Sterile lidocaine  gel was instilled in the urethra. The flexible cystoscope was introduced without difficulty.  Findings:  Anterior urethra: Normal  Posterior urethra: Lateral lobe hypertrophy  Bladder: no mucosal lesions seen; 1+ trabeculations  Ureteral orifices: normal  Additional findings:  none  Saline bladder wash for cytology was performed.    The cystoscope was then removed.  The patient tolerated the procedure well.

## 2023-12-18 ENCOUNTER — Encounter: Payer: Self-pay | Admitting: Urology

## 2023-12-26 ENCOUNTER — Encounter: Payer: Self-pay | Admitting: Urology

## 2024-02-19 DIAGNOSIS — H25012 Cortical age-related cataract, left eye: Secondary | ICD-10-CM | POA: Diagnosis not present

## 2024-02-19 DIAGNOSIS — H5202 Hypermetropia, left eye: Secondary | ICD-10-CM | POA: Diagnosis not present

## 2024-02-19 DIAGNOSIS — H2512 Age-related nuclear cataract, left eye: Secondary | ICD-10-CM | POA: Diagnosis not present

## 2024-02-19 DIAGNOSIS — H524 Presbyopia: Secondary | ICD-10-CM | POA: Diagnosis not present

## 2024-07-14 ENCOUNTER — Other Ambulatory Visit: Payer: Self-pay

## 2024-07-14 DIAGNOSIS — C8295 Follicular lymphoma, unspecified, lymph nodes of inguinal region and lower limb: Secondary | ICD-10-CM

## 2024-07-15 ENCOUNTER — Inpatient Hospital Stay: Payer: Medicare Other | Admitting: Hematology

## 2024-07-15 ENCOUNTER — Inpatient Hospital Stay: Payer: Medicare Other | Attending: Hematology

## 2024-07-15 VITALS — BP 135/84 | HR 62 | Temp 97.9°F | Resp 19 | Wt 192.0 lb

## 2024-07-15 DIAGNOSIS — N400 Enlarged prostate without lower urinary tract symptoms: Secondary | ICD-10-CM | POA: Diagnosis not present

## 2024-07-15 DIAGNOSIS — C8295 Follicular lymphoma, unspecified, lymph nodes of inguinal region and lower limb: Secondary | ICD-10-CM | POA: Diagnosis not present

## 2024-07-15 DIAGNOSIS — C820A FollIcular lymphoma grade i, in remission: Secondary | ICD-10-CM | POA: Diagnosis present

## 2024-07-15 LAB — CBC WITH DIFFERENTIAL (CANCER CENTER ONLY)
Abs Immature Granulocytes: 0.01 K/uL (ref 0.00–0.07)
Basophils Absolute: 0 K/uL (ref 0.0–0.1)
Basophils Relative: 1 %
Eosinophils Absolute: 0.2 K/uL (ref 0.0–0.5)
Eosinophils Relative: 3 %
HCT: 48.4 % (ref 39.0–52.0)
Hemoglobin: 15.8 g/dL (ref 13.0–17.0)
Immature Granulocytes: 0 %
Lymphocytes Relative: 25 %
Lymphs Abs: 1.7 K/uL (ref 0.7–4.0)
MCH: 27.4 pg (ref 26.0–34.0)
MCHC: 32.6 g/dL (ref 30.0–36.0)
MCV: 83.9 fL (ref 80.0–100.0)
Monocytes Absolute: 0.7 K/uL (ref 0.1–1.0)
Monocytes Relative: 10 %
Neutro Abs: 4.3 K/uL (ref 1.7–7.7)
Neutrophils Relative %: 61 %
Platelet Count: 138 K/uL — ABNORMAL LOW (ref 150–400)
RBC: 5.77 MIL/uL (ref 4.22–5.81)
RDW: 14.1 % (ref 11.5–15.5)
WBC Count: 7 K/uL (ref 4.0–10.5)
nRBC: 0 % (ref 0.0–0.2)

## 2024-07-15 LAB — CMP (CANCER CENTER ONLY)
ALT: 30 U/L (ref 0–44)
AST: 34 U/L (ref 15–41)
Albumin: 4.5 g/dL (ref 3.5–5.0)
Alkaline Phosphatase: 77 U/L (ref 38–126)
Anion gap: 9 (ref 5–15)
BUN: 27 mg/dL — ABNORMAL HIGH (ref 8–23)
CO2: 29 mmol/L (ref 22–32)
Calcium: 9.9 mg/dL (ref 8.9–10.3)
Chloride: 102 mmol/L (ref 98–111)
Creatinine: 1.47 mg/dL — ABNORMAL HIGH (ref 0.61–1.24)
GFR, Estimated: 49 mL/min — ABNORMAL LOW (ref >60)
Glucose, Bld: 105 mg/dL — ABNORMAL HIGH (ref 70–99)
Potassium: 4.6 mmol/L (ref 3.5–5.1)
Sodium: 140 mmol/L (ref 135–145)
Total Bilirubin: 0.9 mg/dL (ref 0.0–1.2)
Total Protein: 6.8 g/dL (ref 6.5–8.1)

## 2024-07-15 LAB — LACTATE DEHYDROGENASE: LDH: 167 U/L (ref 105–235)

## 2024-07-15 NOTE — Progress Notes (Signed)
 " HEMATOLOGY ONCOLOGY PROGRESS NOTE  Date of service: 07/15/2024  Patient Care Team: Kennyth Worth HERO, MD as PCP - General (Family Medicine) Richard Emaline Brink, MD as Consulting Physician (Hematology) Richard Rush, MD as Attending Physician (Urology) Richard [name], OD as Consulting Physician (Optometry) Richard [name], [facility] as Pharmacist (Pharmacist)  CHIEF COMPLAINT/PURPOSE OF CONSULTATION: Follow-up for continued evaluation and management of follicular lymphoma   HISTORY OF PRESENTING ILLNESS:  Richard [name] is a wonderful 78 y.o. male who has been referred to us  by Dr. Vanderbilt for evaluation and management of low grade follicular lymphoma. He is being accompanied by his wife, Richard [name] today. He notes that he first noticed a lump to his left inner thigh during a shower approximately 3-4 years ago and noted that it increased in size over the past couple of months.    He had an US  of the LLE on 07/16/2018 showed: Soft tissue masses in the left inguinal region most compatible with enlarged lymph nodes/adenopathy, the largest measuring up to 6.1 cm.   On 08/20/2018, the patient underwent cytometry that showed: Tissue-Flow Cytometry with monoclonal B-cell population identified.    Lymph node biopsy on 08/20/2018 showed: Lymph node, needle/core biopsy, Left Inguinal with atypical lymphoid proliferation.    On 09/16/2018, the patient had biopsy completed with pathology showing: Lymph node for lymphoma, left inguinal with low grade follicular lymphoma.    PMHx, he has a hx of squamous cell carcinoma to bilateral sides of his face, none that is active.    SHx, he has had several cystoscopies within the past 4 months and his urologist is    On review of systems, he reports additional left inguinal lump x 4-5 months, intentional weight loss, recurrent kidney stones (most recently December 2019). he denies fever, chills, night sweats, no other areas of concerns, leg swelling, abdominal pain, flank  pain, testicular pain/swelling, rashes, and any other symptoms. He hasn't had his prior stones analyzed to determine the cause of them. He has intentionally lost 45 lbs in the past couple of weeks through keto diet and intermittent fasting. He doesn't consume bread or pasta. He works out with [employer] about every other day. His PCP is Kennyth Worth HERO, MD and his Urologist is Dr. Rush Richard. Pertinent positives are listed and detailed within the above HPI.   SUMMARY OF ONCOLOGIC HISTORY: Oncology History   No problem history exists.    INTERVAL HISTORY:  Richard [name] is a 78 y.o. male who is here today for continued evaluation and management of follicular lymphoma .   he was last seen by me on 07/15/2023; at the time he did not have any concerns and was doing well.   Today, he notes that he is still experiencing kidney stone issues.  He is also still having bloody urine because of the stones.   He also notes feeling stiff in his back due to him not being as active.  Denies fever, night sweats, and chills.  REVIEW OF SYSTEMS:   10 Point review of systems of done and is negative except as noted above.  MEDICAL HISTORY Past Medical History:  Diagnosis Date   Aneurysm    left common iliac-stable   Arthritis    Fasting hyperglycemia    Follicular lymphoma (HCC) dx'd 07/2018   History of kidney stones    Hyperlipidemia    LDL goal = < 100 based on NMR Lipoproprofile   Hypertension    Mass of left lower extremity  inner thigh   Nephrolithiasis    left    Renal insufficiency    Sepsis (HCC) 03/24/2013   Squamous cell carcinoma, face    Dr Richard [name]   Ureteral stone with hydronephrosis 03/24/2013   left    SURGICAL HISTORY Past Surgical History:  Procedure Laterality Date   CATARACT EXTRACTION  2005   OD    COLONOSCOPY W/ POLYPECTOMY  2007   adenoma X2 ; [facility]   CYSTOSCOPY W/ URETERAL STENT PLACEMENT Left 03/23/2013   Procedure: CYSTOSCOPY WITH RETROGRADE  PYELOGRAM/URETERAL STENT PLACEMENT;  Surgeon: Richard Gwenyth Brooks, MD;  Location: [facility];  Service: Urology;  Laterality: Left;   CYSTOSCOPY WITH RETROGRADE PYELOGRAM, URETEROSCOPY AND STENT PLACEMENT Bilateral 07/15/2018   Procedure: CYSTOSCOPY WITH RETROGRADE PYELOGRAM, URETEROSCOPY;  Surgeon: Richard Rush, MD;  Location: [facility];  Service: Urology;  Laterality: Bilateral;   CYSTOSCOPY WITH STENT PLACEMENT Left 07/15/2018   Procedure: CYSTOSCOPY WITH STENT PLACEMENT;  Surgeon: Richard Rush, MD;  Location: [facility];  Service: Urology;  Laterality: Left;   CYSTOSCOPY WITH URETEROSCOPY  02/28/2012   Procedure: CYSTOSCOPY WITH URETEROSCOPY;  Surgeon: [name] JINNY Watt, MD;  Location: [facility];  Service: Urology;  Laterality: Left;   CYSTOSCOPY/URETEROSCOPY/HOLMIUM LASER/STENT PLACEMENT Right 07/31/2018   Procedure: CYSTOSCOPY STENT REMOVAL  RIGHT URETEROSCOPY WITH HOLMIUM LASER POSSIBLE STENT PLACEMENT;  Surgeon: Richard Rush, MD;  Location: [facility];  Service: Urology;  Laterality: Right;   HOLMIUM LASER APPLICATION Left 03/31/2013   Procedure: HOLMIUM LASER APPLICATION;  Surgeon: [name] JINNY Watt, MD;  Location: [facility];  Service: Urology;  Laterality: Left;   HOLMIUM LASER APPLICATION Right 07/15/2018   Procedure: HOLMIUM LASER APPLICATION;  Surgeon: Richard Rush, MD;  Location: [facility];  Service: Urology;  Laterality: Right;   INGUINAL LYMPH NODE BIOPSY Left 09/16/2018   Procedure: LEFT INGUINAL LYMPH NODE BIOPSY;  Surgeon: Richard [name] Ned, MD;  Location: [facility];  Service: General;  Laterality: Left;   LITHOTRIPSY      X2 w/o benefit   NEPHROLITHOTOMY  02/28/2012   Procedure: NEPHROLITHOTOMY PERCUTANEOUS;  Surgeon: [name] JINNY Watt, MD;  Location: [facility];  Service: Urology;  Laterality: Left;   TONSILLECTOMY     URETEROSCOPY Left 03/31/2013   Procedure: URETEROSCOPY;  Surgeon: [name] JINNY Watt, MD;  Location: [facility];  Service: Urology;  Laterality: Left;   VASECTOMY      SOCIAL  HISTORY Social History[1]  Social History   Social History Narrative   Fun: Work around the house and work out.     SOCIAL DRIVERS OF HEALTH SDOH Screenings   Food Insecurity: No Food Insecurity (09/30/2023)  Housing: Unknown (09/30/2023)  Transportation Needs: No Transportation Needs (09/30/2023)  Utilities: Not At Risk (09/30/2023)  Alcohol Screen: Low Risk (09/30/2023)  Depression (PHQ2-9): Low Risk (09/30/2023)  Financial Resource Strain: Low Risk (09/30/2023)  Physical Activity: Sufficiently Active (09/30/2023)  Social Connections: Moderately Isolated (09/30/2023)  Stress: No Stress Concern Present (09/30/2023)  Tobacco Use: Low Risk (12/11/2023)  Health Literacy: Adequate Health Literacy (09/30/2023)     FAMILY HISTORY Family History  Problem Relation Age of Onset   Heart attack Father 50        CBAG X5 vessels   Hyperlipidemia Father    Hypertension Father    Heart disease Father        before age 19   Hypertension Brother    Aortic aneurysm Paternal Uncle        AAA ruptured   Heart disease Mother  Hyperlipidemia Mother    Hypertension Mother    Diabetes Neg Hx      ALLERGIES: is allergic to angiotensin receptor blockers, ciprofloxacin , and pyridium  [phenazopyridine ].  MEDICATIONS  Current Outpatient Medications  Medication Sig Dispense Refill   ABRYSVO 120 MCG/0.5ML injection  (Patient not taking: Reported on 09/24/2023)     amLODipine  (NORVASC ) 5 MG tablet Take 1 tablet (5 mg total) by mouth daily. 90 tablet 3   Ascorbic Acid (VITAMIN C PO) Take by mouth.     atorvastatin  (LIPITOR) 20 MG tablet TAKE ONE TABLET BY MOUTH ON MONDAY, WEDNESDAY, FRIDAY, AND SUNDAY 51 tablet 3   baclofen  (LIORESAL ) 10 MG tablet TAKE 1 TABLET BY MOUTH THREE TIMES A DAY AS NEEDED FOR MUSCLE SPASMS 30 tablet 5   COMIRNATY syringe      ferrous sulfate 324 MG TBEC Take 324 mg by mouth.     FLUZONE HIGH-DOSE 0.5 ML injection      FLUZONE HIGH-DOSE QUADRIVALENT 0.7 ML SUSY       HYDROcodone -acetaminophen  (NORCO) 5-325 MG tablet Take 1-2 tablets by mouth every 6 (six) hours as needed for moderate pain or severe pain (pain from renal stones). (Patient not taking: Reported on 09/30/2023) 30 tablet 0   metoprolol  tartrate (LOPRESSOR ) 25 MG tablet TAKE ONE-HALF (0.5) TABLET BY MOUTH TWO TIMES A DAY 90 tablet 3   Multiple Vitamin (MULTIVITAMIN) tablet Take 1 tablet by mouth daily.     tamsulosin  (FLOMAX ) 0.4 MG CAPS capsule TAKE ONE CAPSULE BY MOUTH EVERY NIGHT AT BEDTIME 90 capsule 3   VITAMIN D PO Take by mouth.     No current facility-administered medications for this visit.    PHYSICAL EXAMINATION: ECOG PERFORMANCE STATUS: 0 - Asymptomatic VITALS: Vitals:   07/15/24 1036  BP: 135/84  Pulse: 62  Resp: 19  Temp: 97.9 F (36.6 C)  SpO2: 98%   Filed Weights   07/15/24 1036  Weight: 192 lb (87.1 kg)   Body mass index is 26.04 kg/m.  GENERAL: alert, in no acute distress and comfortable SKIN: no acute rashes, no significant lesions EYES: conjunctiva are pink and non-injected, sclera anicteric OROPHARYNX: MMM, no exudates, no oropharyngeal erythema or ulceration NECK: supple, no JVD LYMPH:  no palpable lymphadenopathy in the cervical, axillary or inguinal regions LUNGS: clear to auscultation b/l with normal respiratory effort HEART: regular rate & rhythm ABDOMEN:  normoactive bowel sounds , non tender, not distended, no hepatosplenomegaly Extremity: no pedal edema PSYCH: alert & oriented x 3 with fluent speech NEURO: no focal motor/sensory deficits  LABORATORY DATA:   I have reviewed the data as listed     Latest Ref Rng & Units 07/15/2024    9:55 AM 09/24/2023   10:32 AM 07/15/2023    9:56 AM  CBC EXTENDED  WBC 4.0 - 10.5 K/uL 7.0  4.9  6.4   RBC 4.22 - 5.81 MIL/uL 5.77  5.59  5.82   Hemoglobin 13.0 - 17.0 g/dL 84.1  84.3  84.0   HCT 39.0 - 52.0 % 48.4  48.0  50.1   Platelets 150 - 400 K/uL 138  154.0  156   NEUT# 1.7 - 7.7 K/uL 4.3   3.7    Lymph# 0.7 - 4.0 K/uL 1.7   1.8         Latest Ref Rng & Units 07/15/2024    9:55 AM 11/20/2023   10:43 AM 09/24/2023   10:32 AM  CMP  Glucose 70 - 99 mg/dL 894   97  BUN 8 - 23 mg/dL 27   25   Creatinine 9.38 - 1.24 mg/dL 8.52  8.59  8.69   Sodium 135 - 145 mmol/L 140   140   Potassium 3.5 - 5.1 mmol/L 4.6   4.4   Chloride 98 - 111 mmol/L 102   103   CO2 22 - 32 mmol/L 29   30   Calcium  8.9 - 10.3 mg/dL 9.9   9.4   Total Protein 6.5 - 8.1 g/dL 6.8   6.4   Total Bilirubin 0.0 - 1.2 mg/dL 0.9   1.1   Alkaline Phos 38 - 126 U/L 77   62   AST 15 - 41 U/L 34   24   ALT 0 - 44 U/L 30   20    LDH: Component     Latest Ref Rng 07/15/2024  LDH     105 - 235 U/L 167     08/10/2019 CT Abdomen Pelvis Wo Contrast (Accession [id number])        RADIOGRAPHIC STUDIES: I have personally reviewed the radiological images as listed and agreed with the findings in the report. No results found.  ASSESSMENT & PLAN:  78 y.o. male with  1. Low-grade follicular lymphoma- Stage I to II -First noticed a left inner thigh lump approximately 3-4 years ago and noted that it increased in size over the past couple of months.  -He had an US  of the LLE on 07/16/2018 showed: Soft tissue masses in the left inguinal region most compatible with enlarged lymph nodes/adenopathy, the largest measuring up to 6.1 cm. 08/20/2018 Tissue-Flow Cytometry with monoclonal B-cell population identified.  08/20/2018 LN Biopsy revealed Left Inguinal with atypical lymphoid proliferation.  09/16/2018: Lymph node biopsy, left inguinal with low grade follicular lymphoma.    09/29/18 Hep B and Hep C negative 10/08/18 PET/CT revealed Left inguinal and external iliac hypermetabolic adenopathy, consistent with active lymphoma. ([place] 4). 2. No extrapelvic disease identified. 3. Coronary artery atherosclerosis. Aortic Atherosclerosis. 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 08/10/2019 CT  Abdomen Pelvis Wo Contrast (Accession [id number]) which revealed 1. Interval response to therapy as evidenced by decrease in size of left external iliac and left inguinal adenopathy. 2. Bilateral renal stones. 3. Aortic atherosclerosis (ICD10-I70.0). 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    PLAN: - Discussed lab results on 07/15/2024 in detail with patient: - CMP   - Creatinine :  1.47 - Calcium : 9.9 - CBC   - Hemoglobin: 15.8  - WBC: 7.0  - Platelets: 138  - LDH: 167  - no lab or clinical evidence of follicular lymphoma progression at this time. - He has had CT cardiac 09/2023 and CT hematuria protocol 10/2023 - which did not show any overt evidence of follicular lymphoma progression at this time. - for FL at this point he has had non progression for a long time and prefers to f/u with PCP since he does have several more pressing medical issues.  2. Intermittent hematuria --follows with Urology  -we reviewed his CT hematuria study from 11/20/2023 which showed 9 mm enhancing subcapsular mass in anterior midpole of left kidney, consistent with a small renal cell carcinoma No evidence of metastatic disease. Bilateral nephrolithiasis. No evidence of ureteral calculi or hydronephrosis.Stable mild urothelial thickening in left renal pelvis and proximal left ureter, suspicious for chronic pyelitis and ureteritis.Consider evaluation with retrograde ureteroscopy if clinically warranted.   Stable mildly enlarged prostate. Plan -he was  recommended to maintain close f/u with his PCP and urologist. -might need additional imaging of indeterminate findings in the left kidney with possible MRI Kidney which per CT report could be concerning for early RCC.  3. . Patient Active Problem List   Diagnosis Date Noted   Renal mass, left 12/11/2023   Gross hematuria 10/23/2023   Nephrolithiasis 10/23/2023   Low testosterone  07/05/2020   CKD (chronic kidney disease) stage 3, GFR 30-59  ml/min (HCC) 05/11/2019   Restless legs syndrome 05/11/2019   Intermittent low back pain 05/11/2019   Follicular lymphoma grade I of intrapelvic lymph nodes (HCC) 12/16/2018   Erectile dysfunction 06/26/2017   Common iliac aneurysm 08/01/2011   ADENOMATOUS COLONIC POLYP 03/08/2009   Fasting hyperglycemia 03/08/2009   HYPERLIPIDEMIA 09/02/2007   Essential hypertension 09/02/2007   NEPHROLITHIASIS, HX OF 09/02/2007  -continue f/u with PCP   FOLLOW-UP: prefers to follow up with PCP, we will follow up if requested.  The total time spent in the appointment was 30 minutes* .  All of the patient's questions were answered and the patient knows to call the clinic with any problems, questions, or concerns.  Emaline Saran MD MS AAHIVMS [facility] Hematology/Oncology Physician [facility]  *Total Encounter Time as defined by the Centers for Medicare and Medicaid Services includes, in addition to the face-to-face time of a patient visit (documented in the note above) non-face-to-face time: obtaining and reviewing outside history, ordering and reviewing medications, tests or procedures, care coordination (communications with other health care professionals or caregivers) and documentation in the medical record.  I, Marijo Sharps, acting as a neurosurgeon for Emaline Saran, MD.,have documented all relevant documentation on the behalf of Emaline Saran, MD,as directed by  Emaline Saran, MD while in the presence of Emaline Saran, MD.  I have reviewed the above documentation for accuracy and completeness, and I agree with the above.  Emaline Saran, MD      [1]  Social History Tobacco Use   Smoking status: Never   Smokeless tobacco: Never  Vaping Use   Vaping status: Never Used  Substance Use Topics   Alcohol use: Yes    Comment:  rarely   Drug use: No   "

## 2024-09-24 ENCOUNTER — Encounter: Payer: Medicare Other | Admitting: Family Medicine

## 2024-10-01 ENCOUNTER — Ambulatory Visit
